# Patient Record
Sex: Male | Born: 1950 | Race: White | Hispanic: No | Marital: Married | Smoking: Never smoker
Health system: Southern US, Community
[De-identification: ages and names within clinical notes are randomized; demographics above are authoritative.]

## PROBLEM LIST (undated history)

## (undated) DIAGNOSIS — S83209A Unspecified tear of unspecified meniscus, current injury, unspecified knee, initial encounter: Secondary | ICD-10-CM

## (undated) DIAGNOSIS — N400 Enlarged prostate without lower urinary tract symptoms: Secondary | ICD-10-CM

## (undated) DIAGNOSIS — N189 Chronic kidney disease, unspecified: Secondary | ICD-10-CM

## (undated) DIAGNOSIS — R011 Cardiac murmur, unspecified: Secondary | ICD-10-CM

## (undated) DIAGNOSIS — K219 Gastro-esophageal reflux disease without esophagitis: Secondary | ICD-10-CM

## (undated) DIAGNOSIS — I10 Essential (primary) hypertension: Secondary | ICD-10-CM

## (undated) DIAGNOSIS — B019 Varicella without complication: Secondary | ICD-10-CM

## (undated) DIAGNOSIS — E119 Type 2 diabetes mellitus without complications: Secondary | ICD-10-CM

## (undated) DIAGNOSIS — D649 Anemia, unspecified: Secondary | ICD-10-CM

## (undated) HISTORY — DX: Chronic kidney disease, unspecified: N18.9

## (undated) HISTORY — DX: Essential (primary) hypertension: I10

## (undated) HISTORY — DX: Type 2 diabetes mellitus without complications: E11.9

## (undated) HISTORY — DX: Anemia, unspecified: D64.9

## (undated) HISTORY — PX: ESOPHAGOGASTRODUODENOSCOPY: SHX1529

## (undated) HISTORY — DX: Gastro-esophageal reflux disease without esophagitis: K21.9

## (undated) HISTORY — PX: COLONOSCOPY: SHX174

## (undated) HISTORY — DX: Varicella without complication: B01.9

## (undated) HISTORY — DX: Benign prostatic hyperplasia without lower urinary tract symptoms: N40.0

---

## 2010-07-10 ENCOUNTER — Ambulatory Visit: Payer: Self-pay | Admitting: Unknown Physician Specialty

## 2010-07-15 LAB — PATHOLOGY REPORT

## 2013-08-10 ENCOUNTER — Ambulatory Visit: Payer: Self-pay | Admitting: Unknown Physician Specialty

## 2014-03-19 DIAGNOSIS — I10 Essential (primary) hypertension: Secondary | ICD-10-CM | POA: Insufficient documentation

## 2014-03-19 DIAGNOSIS — D509 Iron deficiency anemia, unspecified: Secondary | ICD-10-CM | POA: Insufficient documentation

## 2014-03-19 DIAGNOSIS — N400 Enlarged prostate without lower urinary tract symptoms: Secondary | ICD-10-CM | POA: Insufficient documentation

## 2014-03-19 DIAGNOSIS — K219 Gastro-esophageal reflux disease without esophagitis: Secondary | ICD-10-CM | POA: Insufficient documentation

## 2014-03-19 DIAGNOSIS — E119 Type 2 diabetes mellitus without complications: Secondary | ICD-10-CM | POA: Insufficient documentation

## 2014-03-19 DIAGNOSIS — E1129 Type 2 diabetes mellitus with other diabetic kidney complication: Secondary | ICD-10-CM | POA: Insufficient documentation

## 2014-03-19 DIAGNOSIS — E785 Hyperlipidemia, unspecified: Secondary | ICD-10-CM | POA: Insufficient documentation

## 2014-03-26 ENCOUNTER — Inpatient Hospital Stay: Payer: Self-pay | Admitting: Internal Medicine

## 2014-03-26 LAB — TROPONIN I: Troponin-I: <0.02 ng/mL

## 2014-03-26 LAB — COMPREHENSIVE METABOLIC PANEL
ALBUMIN: 3.5 g/dL (ref 3.4–5.0)
Alkaline Phosphatase: 87 U/L
Anion Gap: 9 (ref 7–16)
BILIRUBIN TOTAL: 0.3 mg/dL (ref 0.2–1.0)
BUN: 41 mg/dL — AB (ref 7–18)
CALCIUM: 8.7 mg/dL (ref 8.5–10.1)
Chloride: 95 mmol/L — ABNORMAL LOW (ref 98–107)
Co2: 23 mmol/L (ref 21–32)
Creatinine: 3.36 mg/dL — ABNORMAL HIGH (ref 0.60–1.30)
EGFR (African American): 21 — ABNORMAL LOW
GFR CALC NON AF AMER: 18 — AB
Glucose: 190 mg/dL — ABNORMAL HIGH (ref 65–99)
Osmolality: 270 (ref 275–301)
POTASSIUM: 4.2 mmol/L (ref 3.5–5.1)
SGOT(AST): 106 U/L — ABNORMAL HIGH (ref 15–37)
SGPT (ALT): 104 U/L — ABNORMAL HIGH
Sodium: 127 mmol/L — ABNORMAL LOW (ref 136–145)
TOTAL PROTEIN: 7.4 g/dL (ref 6.4–8.2)

## 2014-03-26 LAB — URINALYSIS, COMPLETE
BLOOD: NEGATIVE
Bilirubin,UR: NEGATIVE
GLUCOSE, UR: NEGATIVE mg/dL (ref 0–75)
Hyaline Cast: 20
KETONE: NEGATIVE
LEUKOCYTE ESTERASE: NEGATIVE
Nitrite: NEGATIVE
PROTEIN: NEGATIVE
Ph: 5 (ref 4.5–8.0)
Specific Gravity: 1.012 (ref 1.003–1.030)
Squamous Epithelial: 2

## 2014-03-26 LAB — CK-MB
CK-MB: 1.9 ng/mL (ref 0.5–3.6)
CK-MB: 1.6 ng/mL (ref 0.5–3.6)
CK-MB: 1.2 ng/mL (ref 0.5–3.6)

## 2014-03-26 LAB — HEMOGLOBIN A1C: Hemoglobin A1C: 7 % — ABNORMAL HIGH (ref 4.2–6.3)

## 2014-03-26 LAB — CBC
HCT: 33.4 % — ABNORMAL LOW (ref 40.0–52.0)
HGB: 11.1 g/dL — ABNORMAL LOW (ref 13.0–18.0)
MCH: 31.8 pg (ref 26.0–34.0)
MCHC: 33.2 g/dL (ref 32.0–36.0)
MCV: 96 fL (ref 80–100)
Platelet: 169 10*3/uL (ref 150–440)
RBC: 3.49 10*6/uL — AB (ref 4.40–5.90)
RDW: 13.5 % (ref 11.5–14.5)
WBC: 3.1 10*3/uL — AB (ref 3.8–10.6)

## 2014-03-26 LAB — TSH: THYROID STIMULATING HORM: 1.11 u[IU]/mL

## 2014-03-27 LAB — CREATININE, SERUM
CREATININE: 2.18 mg/dL — AB (ref 0.60–1.30)
EGFR (African American): 36 — ABNORMAL LOW
EGFR (Non-African Amer.): 31 — ABNORMAL LOW

## 2014-03-27 LAB — SODIUM, URINE, RANDOM: Sodium, Urine Random: 57 mmol/L (ref 20–110)

## 2014-03-27 LAB — CREATININE, URINE, RANDOM: CREATININE, URINE RANDOM: 84 mg/dL (ref 30.0–125.0)

## 2014-03-28 LAB — CREATININE, SERUM
Creatinine: 1.92 mg/dL — ABNORMAL HIGH (ref 0.60–1.30)
EGFR (African American): 41 — ABNORMAL LOW
EGFR (Non-African Amer.): 36 — ABNORMAL LOW

## 2014-04-01 LAB — CULTURE, BLOOD (SINGLE)

## 2014-11-13 ENCOUNTER — Ambulatory Visit
Admit: 2014-11-13 | Disposition: A | Discharge status: Home / Self Care | Payer: Self-pay | Attending: Hematology and Oncology | Admitting: Hematology and Oncology

## 2014-11-29 ENCOUNTER — Ambulatory Visit
Admit: 2014-11-29 | Disposition: A | Discharge status: Home / Self Care | Payer: Self-pay | Attending: Hematology and Oncology | Admitting: Hematology and Oncology

## 2014-12-21 NOTE — H&P (Signed)
PATIENT NAME:  Joshua Cherry, Joshua Cherry MR#:  [id number] DATE OF BIRTH:  02-Jul-1951  DATE OF ADMISSION:  03/26/2014  ADMITTING PHYSICIAN: Gladstone Lighter, MD   PRIMARY CARE PHYSICIAN: Dr. Ellison Hughs at [facility].    CHIEF COMPLAINT: Dizziness and lightheadedness.   HISTORY OF PRESENT ILLNESS: Mr. Joshua Cherry is a very pleasant 64 year old Caucasian male with past medical history significant for hypertension, non-insulin-dependent diabetes mellitus, gastroesophageal reflux disease, benign prostatic hypertrophy, hyperlipidemia who was sent in from [facility] [place] secondary to worsening dizziness and lightheadedness.  The patient worked third shift at [employer] for the past 2 to 3 days. He feels like he has been extremely lightheaded. It is not positional. Does not worsen standing up. It is present all the time. He had diarrhea all night last night. He felt like he has been feeling weak for the last few days. He had blood work done at his PCPs office about two weeks ago. They that his kidney function was slightly off and he needs to be monitored and has a follow-up this week.  A couple of days ago he felt so weak that he almost passed out in the kitchen. Denies any seizure-like episodes.  He went to work last night and was feeling extremely bad and this morning his work people sent him to the ER for monitoring. Labs indicate acute renal failure and hypernatremia and he is being admitted for the same. The patient takes lisinopril, HCTZ and metformin at home and he has being continued continuing to take lately.   PAST MEDICAL HISTORY:  1. Hypertension.  2. Non-insulin-dependent diabetes mellitus.  3. Gastroesophageal reflux disease.  4. Benign prostatic hypertrophy.  5. Hyperlipidemia.   PAST SURGICAL HISTORY: None.   ALLERGIES: NO KNOWN DRUG ALLERGIES.   CURRENT HOME MEDICATIONS: Include:  1. Aspirin 81 mg p.o. daily.  2. Coreg 3.125 mg p.o. b.i.d.  3. Lisinopril HCTZ 20/12.5 mg 1 tablet  p.o. daily.  4. Ferrous sulfate 325 mg p.o. daily.  5. Finasteride 5 mg p.o. at bedtime.  6. Glipizide 10 mg p.o. at bedtime.  7. Glipizide 5 mg in the morning.  8. Lovastatin 40 mg p.o. at bedtime.  9. Metformin 1000 mg p.o. b.i.d.  10. Prilosec 20 mg p.o. b.i.d.  11. Actos 45 mg p.o. daily.  12. Super B complex oral tablet 1 tablet p.o. daily.  13. Flomax 0.4 mg p.o. daily.  14. Vitamin B12 1000 mcg injectable solution 1 mL  once a month.  15. Vitamin D3, 2000 international units p.o. daily.   SOCIAL HISTORY: Lives at home with his wife. Works at the [employer] in [place]. No smoking or alcohol use.   FAMILY HISTORY: Both parents had heart disease and stroke.   REVIEW OF SYSTEMS:  CONSTITUTIONAL: No fevers. Positive for fatigue and weakness.  EYES: No blurred vision, double vision. No other inflammation or glaucoma. Uses glasses bifocals.  ENT: No tinnitus, ear pain, hearing loss, epistaxis or discharge.  RESPIRATORY: No cough, wheeze, hemoptysis or chronic obstructive pulmonary disease.  CARDIOVASCULAR: No chest pain, orthopnea, edema, arrhythmia, palpitations, or syncope.  GASTROINTESTINAL: No nausea, vomiting, diarrhea, abdominal pain, hematemesis, or melena.  GENITOURINARY: Positive for hesitancy. No dysuria, hematuria, renal calculus.  ENDOCRINE: No polyuria, nocturia, thyroid problems, heat or cold intolerance.  HEMATOLOGY: No anemia, easy bruising or bleeding.  SKIN: No acne, rash or lesions.  MUSCULOSKELETAL: No neck, back, shoulder pain, arthritis or gout.  NEUROLOGIC: No numbness, weakness, CVA, transient ischemic attack or seizures.  PSYCHOLOGICAL:  No anxiety, insomnia, depression.   PHYSICAL EXAMINATION: VITAL SIGNS: Temperature 98.1 degree Fahrenheit, pulse 96, respirations 18, blood pressure 145/57 ,  pulse ox 100% on room air.  GENERAL: Well-built, well-nourished male lying in bed, not in any acute distress.  HEENT: Normocephalic, atraumatic. Pupils equal,  round, reacting to light. Anicteric sclerae. Extraocular movements intact.  OROPHARYNX: Clear without erythema, mass or exudates.  NECK: Supple. No thyromegaly, JVD or carotid bruits. No lymphadenopathy.  LUNGS: Moving air bilaterally. No wheeze or crackles. No use of accessory muscles for breathing.  CARDIOVASCULAR: S1, S2, regular rate and rhythm. No murmurs, rubs, or gallops.  ABDOMEN: Soft, nontender, nondistended. No hepatosplenomegaly. Normal bowel sounds.  EXTREMITIES: No pedal edema. No clubbing or cyanosis, 2+ dorsalis pedis pulses palpable bilaterally.  SKIN: No acne, rash or lesions.  LYMPHATICS: No cervical or inguinal lymphadenopathy.  NEUROLOGIC: Cranial nerves intact. No focal motor or sensory deficits.  PSYCHOLOGICAL: The patient is awake, alert, oriented x 3.   LABORATORY DATA: WBC 3.1, hemoglobin is 11.1, hematocrit 33.4, platelet count 169.   Sodium 127, potassium 4.2, chloride 97, bicarbonate 23, BUN 41, creatinine 3.36 and glucose of 190, calcium of 8.7.   ALT 104, AST 106, alkaline phosphatase 87, total bilirubin 0.3 and albumin of 3.5. TSH 1.1. Troponin less than 0.02. Urinalysis negative for any infection. EKG showing normal sinus rhythm. No acute ST-T wave abnormalities, heart rate of 71. CT of the head showing normal CT of the brain.   ASSESSMENT AND PLAN:  A 64 year old man with diabetes, hypertension, hyperlipidemia, gastroesophageal reflux disease and benign prostatic hypertrophy comes in with dizziness and noted to have acute renal failure and hypernatremia.  1. Acute renal failure. Not sure what the baseline creatinine is. Labs have been requested from PCPs office and they are pending at this time. Check orthostatic vitals. Sodium is also low, so he could be prerenal.  Continue IV fluids. Get a renal ultrasound. Recheck in a.m. If not improving,  get nephrology consult. Hold nephrotoxins until then.  2. Hypertension. Hold lisinopril HCTZ. Coreg can be continued at  this time.  3. Diabetes mellitus. On glipizide and Actos. Hold metformin for now and sliding scale insulin. Check hba1c . 4. Benign prostatic hypertrophy. Continue home medications.  5. Gastroesophageal reflux disease, currently on Protonix b.i.d.  6. Deep vein thrombosis prophylaxis. Subcutaneous heparin at this time.  7. CODE STATUS: Full code.   TIME SPENT ON ADMISSION: 50 minutes.    __ __________________________ Gladstone Lighter, MD rk:sg D: 03/26/2014 11:04:48 ET T: 03/26/2014 11:19:16 ET JOB#: [id number]  cc: Gladstone Lighter, MD, <Dictator> [name], MD  Gladstone Lighter MD ELECTRONICALLY SIGNED 03/27/2014 15:08

## 2014-12-21 NOTE — Discharge Summary (Signed)
PATIENT NAME:  Joshua Cherry, Joshua Cherry MR#:  [id number] DATE OF BIRTH:  December 05, 1950  DATE OF ADMISSION:  03/26/2014 DATE OF DISCHARGE:  03/28/2014  DISCHARGE DIAGNOSES: 1.  Acute renal failure, likely prerenal, now improving.  2.  Fever of unknown lesion, now resolved, started on empiric doxycycline for 5 more days.   SECONDARY DIAGNOSES: 1.  Hypertension.  2.  Diabetes.  3.  Gastroesophageal reflux disease. 4.  BPH.  5.  Hyperlipidemia.   CONSULTATIONS: None.   PROCEDURES AND RADIOLOGY: CT scan of the head without contrast on July 28, showed no acute pathology.   Chest x-ray on July 29, showed no acute pathology, thoracic spondylosis.   Bilateral kidney ultrasound on July 28, showed no obstructive uropathy. Right renal cyst.   MAJOR LABORATORY PANEL: Urinalysis on admission was negative. Blood cultures x2 were negative.   HISTORY AND SHORT HOSPITAL COURSE: The patient is a 64 year old male with above-mentioned medical problems was admitted for dizziness and lightheadedness, thought to be due to severe dehydration and acute renal failure. Please see Dr. Leta Baptist dictated history and physical for further details. The patient was started on aggressive IV hydration. His kidney function started improving. This was thought to be prerenal in nature. His creatinine came down to 1.9. His creatinine on admission was 3.36. He was ruled out with 3 negative sets of troponins. He was feeling much better, did not have any further dizziness, was walking around in the hallways without any difficulty and was discharged home in stable condition on July 30.  PHYSICAL EXAMINATION: VITAL SIGNS:  On the date of discharge, are as follows: Temperature 97.5, heart rate 55 per minute, respirations 18 per minute, blood pressure 122/61 mmHg.  He was saturating 98% on room air.  CARDIOVASCULAR: S1, S2 normal. No murmurs, rubs, or gallop.  LUNGS: Clear to auscultation bilaterally. No wheezing, rales, rhonchi, or  crepitation.  ABDOMEN: Soft, benign.  NEUROLOGIC: Nonfocal examination.   All other physical examination remained at baseline.   DISCHARGE MEDICATIONS: 1.  Iron sulfate 325 mg p.o. daily.  2.  Vitamin D3 2000 international units once daily.  3.  Super B complex vitamin once daily.  4.  Tamsulosin 0.4 mg p.o. daily.  5.  Finasteride 5 mg p.o. at bedtime.  6.  Aspirin 81 mg p.o. daily. 7.  Vitamin B12 injection once a month.  8.  Glipizide 10 mg p.o. at bedtime.  9.  Pioglitazone 45 mg p.o. daily.  9.  Omeprazole 20 mg p.o. b.i.d.  10.  Coreg 3.125 mg p.o. b.i.d.  11.  Lovastatin 20 mg 2 tablets p.o. at bedtime. 12.  Glipizide 10 mg 1/2 tablet p.o. daily.  13.  Hydrochlorothiazide/lisinopril 12.5/20 mg 1 tablet p.o. daily. This was instructed to be held until 03/31/2014, can be resumed on 04/01/2014. 14.  Metformin 1000 mg p.o. b.i.d.  Again to be held until 03/31/2014 and can be resumed on 04/01/2014. 15.  Doxycycline 200 mg p.o. daily for 5 more days.   DISCHARGE DIET: Renal diet.   DISCHARGE ACTIVITY: As tolerated.   DISCHARGE INSTRUCTIONS AND FOLLOWUP: The patient was instructed to follow up with his primary care physician, Dr. Ellison Hughs, in 1-2 weeks. He was instructed to get CBC and basic metabolic panel checked in 1 week with results forwarded to his primary care physician for monitoring of his kidney function.   TOTAL TIME DISCHARGING THIS PATIENT:  45 minutes.   ____________________________ Lucina Mellow. Manuella Ghazi, MD vss:ds D: 03/28/2014 19:33:05 ET T: 03/28/2014 20:21:53 ET  JOB#: [id number]  cc: Eliz Nigg S. Manuella Ghazi, MD, <Dictator> [name], MD Lucina Mellow [facility] MD ELECTRONICALLY SIGNED 04/02/2014 9:29

## 2015-02-21 ENCOUNTER — Encounter: Payer: Self-pay | Admitting: Hematology and Oncology

## 2015-02-21 ENCOUNTER — Inpatient Hospital Stay: Payer: BLUE CROSS/BLUE SHIELD | Attending: Hematology and Oncology | Admitting: Hematology and Oncology

## 2015-02-21 VITALS — BP 155/66 | HR 62 | Temp 97.9°F | Resp 17 | Ht 71.0 in | Wt 182.3 lb

## 2015-02-21 DIAGNOSIS — Z79899 Other long term (current) drug therapy: Secondary | ICD-10-CM | POA: Diagnosis not present

## 2015-02-21 DIAGNOSIS — N189 Chronic kidney disease, unspecified: Secondary | ICD-10-CM | POA: Insufficient documentation

## 2015-02-21 DIAGNOSIS — E119 Type 2 diabetes mellitus without complications: Secondary | ICD-10-CM

## 2015-02-21 DIAGNOSIS — Z7982 Long term (current) use of aspirin: Secondary | ICD-10-CM | POA: Insufficient documentation

## 2015-02-21 DIAGNOSIS — E538 Deficiency of other specified B group vitamins: Secondary | ICD-10-CM | POA: Diagnosis not present

## 2015-02-21 DIAGNOSIS — E139 Other specified diabetes mellitus without complications: Secondary | ICD-10-CM | POA: Insufficient documentation

## 2015-02-21 DIAGNOSIS — K219 Gastro-esophageal reflux disease without esophagitis: Secondary | ICD-10-CM

## 2015-02-21 DIAGNOSIS — D631 Anemia in chronic kidney disease: Secondary | ICD-10-CM | POA: Insufficient documentation

## 2015-02-21 DIAGNOSIS — I129 Hypertensive chronic kidney disease with stage 1 through stage 4 chronic kidney disease, or unspecified chronic kidney disease: Secondary | ICD-10-CM | POA: Diagnosis not present

## 2015-02-21 NOTE — Progress Notes (Signed)
[facility] day:  02/21/2015  Chief Complaint: Joshua Cherry is a 64 y.o. male with anemia who is seen for 3 month assessment.  HPI: The patient was last seen in the medical oncology clinic on 12/06/2014.  At that time, initial workup for his anemia was reviewed. Workup suggested anemia of chronic renal disease. He was referred to Dr. Jefm Bryant secondary to a positive ANA of unclear significance. He states that he was told that he "didn't think this was an autoimmune disease". He underwent echocardiogram. He has a follow-up in 6 months.  She was seen by nephrology. His kidney function is stable. He has a follow-up in 6 months.  His creatinine has ranged between 1.92 and 2.18 with an estimated creatinine clearance of 31-36 ml/min.   Labs from 02/11/2015 at [place] revealed a hematocrit 31.5, hemoglobin 10.4, MCV 95, platelets 123,000, and white count 5800 with an ANC of 3000. Iron studies included a saturation of 22%.  TIBC and serum iron were normal.  Ferritin was 75.  PSA was 2.  Symptomatically, he notes no complaints. His energy level is good.  Past Medical History  Diagnosis Date  . Diabetes mellitus without complication   . Hypertension   . Anemia   . GERD (gastroesophageal reflux disease)   . Chronic kidney disease     No past surgical history on file.  Family History  Problem Relation Age of Onset  . Diabetes Mother   . Hypertension Mother   . Diabetes Father     Social History:  reports that he has never smoked. He has never used smokeless tobacco. He reports that he does not drink alcohol or use illicit drugs.  The patient is alone today.  Allergies: Not on File  Current Medications: Current Outpatient Prescriptions  Medication Sig Dispense Refill  . carvedilol (COREG) 3.125 MG tablet Take 3.125 mg by mouth.    . cyanocobalamin (,VITAMIN B-12,) 1000 MCG/ML injection     . finasteride (PROSCAR) 5 MG tablet Take 5 mg by  mouth.    Marland Kitchen glipiZIDE (GLUCOTROL) 10 MG tablet     . lisinopril-hydrochlorothiazide (PRINZIDE,ZESTORETIC) 20-12.5 MG per tablet     . lovastatin (MEVACOR) 20 MG tablet     . omeprazole (PRILOSEC) 20 MG capsule     . ONETOUCH DELICA LANCETS 16X MISC     . pioglitazone (ACTOS) 45 MG tablet     . sitaGLIPtin (JANUVIA) 25 MG tablet     . tamsulosin (FLOMAX) 0.4 MG CAPS capsule     . aspirin EC 81 MG tablet Take 81 mg by mouth.    . B-Complex-C CAPS Take by mouth.    . Cholecalciferol (VITAMIN D3) 2000 UNITS capsule Take by mouth.    . ferrous sulfate 325 (65 FE) MG tablet Take 325 mg by mouth.     No current facility-administered medications for this visit.    Review of Systems:  GENERAL:  Feels fairly good.  Active.  No fevers, sweats or weight loss. PERFORMANCE STATUS (ECOG): 0 HEENT:  No visual changes, runny nose, sore throat, mouth sores or tenderness. Lungs: No shortness of breath or cough.  No hemoptysis. Cardiac:  No chest pain, palpitations, orthopnea, or PND. GI:  No nausea, vomiting, diarrhea, constipation, melena or hematochezia. GU:  No urgency, frequency, dysuria, or hematuria. Musculoskeletal:  No back pain.  No joint pain.  No muscle tenderness. Extremities:  No pain or swelling. Skin:  No rashes or skin  changes. Neuro:  No headache, numbness or weakness, balance or coordination issues. Endocrine:  No diabetes, thyroid issues, hot flashes or night sweats. Psych:  No mood changes, depression or anxiety. Pain:  No focal pain. Review of systems:  All other systems reviewed and found to be negative.   Physical Exam: Blood pressure 155/66, pulse 62, temperature 97.9 F (36.6 C), temperature source Oral, resp. rate 17, height $RemoveBe'5\' 11"'coZEchkLt$  (1.803 m), weight 182 lb 5.1 oz (82.7 kg). GENERAL:  Well developed, well nourished, sitting comfortably in the exam room in no acute distress. MENTAL STATUS:  Alert and oriented to person, place and time. HEAD:  Short gray hair.   Normocephalic, atraumatic, face symmetric, no Cushingoid features. EYES:  Round glasses.  Blue eyes.  Pupils equal round and reactive to light and accomodation.  No conjunctivitis or scleral icterus. ENT:  Oropharynx clear without lesion.  Tongue normal. Mucous membranes moist.  RESPIRATORY:  Clear to auscultation without rales, wheezes or rhonchi. CARDIOVASCULAR:  Regular rate and rhythm without murmur, rub or gallop. ABDOMEN:  Soft, non-tender, with active bowel sounds, and no hepatosplenomegaly.  No masses. SKIN:  No rashes, ulcers or lesions. EXTREMITIES:  Trace ankle edema.  No skin discoloration or tenderness.  No palpable cords. LYMPH NODES: No palpable cervical, supraclavicular, axillary or inguinal adenopathy  NEUROLOGICAL: Unremarkable. PSYCH:  Appropriate.   No visits with results within 3 Day(s) from this visit. Latest known visit with results is:  [facility] Conversion on 03/26/2014  Component Date Value Ref Range Status  . Glucose 03/26/2014 190* 65-99 mg/dL Final  . BUN 03/26/2014 41* 7-18 mg/dL Final  . Creatinine 03/26/2014 3.36* 0.60-1.30 mg/dL Final  . Sodium 03/26/2014 127* 136-145 mmol/L Final  . Potassium 03/26/2014 4.2  3.5-5.1 mmol/L Final  . Chloride 03/26/2014 95* 98-107 mmol/L Final  . Co2 03/26/2014 23  21-32 mmol/L Final  . Calcium, Total 03/26/2014 8.7  8.5-10.1 mg/dL Final  . SGOT(AST) 03/26/2014 106* 15-37 Unit/L Final  . SGPT (ALT) 03/26/2014 104*  Final   Comment: 14-63 NOTE: New Reference Range 03/19/14   . Alkaline Phosphatase 03/26/2014 87   Final   Comment: 46-116 NOTE: New Reference Range 03/19/14   . Albumin 03/26/2014 3.5  3.4-5.0 g/dL Final  . Total Protein 03/26/2014 7.4  6.4-8.2 g/dL Final  . Bilirubin,Total 03/26/2014 0.3  0.2-1.0 mg/dL Final  . Osmolality 03/26/2014 270  275-301 Final  . Anion Gap 03/26/2014 9  7-16 Final  . EGFR (African American) 03/26/2014 21*  Final  . EGFR (Non-African Amer.) 03/26/2014 18*  Final   Comment: eGFR  values <45mL/min/1.73 m2 may be an indication of chronic kidney disease (CKD). Calculated eGFR is useful in patients with stable renal function. The eGFR calculation will not be reliable in acutely ill patients when serum creatinine is changing rapidly. It is not useful in  patients on dialysis. The eGFR calculation may not be applicable to patients at the low and high extremes of body sizes, pregnant women, and vegetarians.   . WBC 03/26/2014 3.1* 3.8-10.6 x10 3/mm 3 Final  . RBC 03/26/2014 3.49* 4.40-5.90 x10 6/mm 3 Final  . HGB 03/26/2014 11.1* 13.0-18.0 g/dL Final  . HCT 03/26/2014 33.4* 40.0-52.0 % Final  . MCV 03/26/2014 96  80-100 fL Final  . MCH 03/26/2014 31.8  26.0-34.0 pg Final  . MCHC 03/26/2014 33.2  32.0-36.0 g/dL Final  . RDW 03/26/2014 13.5  11.5-14.5 % Final  . Platelet 03/26/2014 169  150-440 x10 3/mm 3 Final  . Troponin-I 03/26/2014 <  0.02   Final   Comment: 0.00-0.05 0.05 ng/mL or less: NEGATIVE  Repeat testing in 3-6 hrs  if clinically indicated. >0.05 ng/mL: POTENTIAL  MYOCARDIAL INJURY. Repeat  testing in 3-6 hrs if  clinically indicated. NOTE: An increase or decrease  of 30% or more on serial  testing suggests a  clinically important change   . Thyroid Stimulating Horm 03/26/2014 1.11   Final   Comment: 0.45-4.50 (International Unit)  ----------------------- Pregnant patients have  different reference  ranges for TSH:  - - - - - - - - - -  Pregnant, first trimetser:  0.36 - 2.50 uIU/mL   . Color - urine 03/26/2014 Amber   Final  . Clarity - urine 03/26/2014 Cloudy   Final  . [place] Meth 03/26/2014 Negative  0-75 mg/dL Final  . Bilirubin,UR 03/26/2014 Negative  NEGATIVE Final  . Ketone 03/26/2014 Negative  NEGATIVE Final  . Specific Gravity 03/26/2014 1.012  1.003-1.030 Final  . Blood 03/26/2014 Negative  NEGATIVE Final  . Ph 03/26/2014 5.0  4.5-8.0 Final  . Protein 03/26/2014 Negative  NEGATIVE Final  . Nitrite 03/26/2014 Negative  NEGATIVE  Final  . Leukocyte Esterase 03/26/2014 Negative  NEGATIVE Final  . RBC,UR 03/26/2014 1 /HPF  0-5 /HPF Final  . WBC UR 03/26/2014 4 /HPF  0-5 /HPF Final  . Bacteria 03/26/2014 TRACE  NONE SEEN Final  . Squamous Epithelial 03/26/2014 2 /HPF   Final  . Mucous 03/26/2014 PRESENT   Final  . Hyaline Cast 03/26/2014 20 /LPF   Final  . Hemoglobin A1C 03/26/2014 7.0* 4.2-6.3 % Final   Comment: The American Diabetes Association recommends that a primary goal of therapy should be <7% and that physicians should reevaluate the treatment regimen in patients with HbA1c values consistently >8%.   Marland Kitchen CK-MB 03/26/2014 1.9  0.5-3.6 ng/mL Final  . Troponin-I 03/26/2014 < 0.02   Final   Comment: 0.00-0.05 0.05 ng/mL or less: NEGATIVE  Repeat testing in 3-6 hrs  if clinically indicated. >0.05 ng/mL: POTENTIAL  MYOCARDIAL INJURY. Repeat  testing in 3-6 hrs if  clinically indicated. NOTE: An increase or decrease  of 30% or more on serial  testing suggests a  clinically important change   . CK-MB 03/26/2014 1.6  0.5-3.6 ng/mL Final  . Troponin-I 03/26/2014 < 0.02   Final   Comment: 0.00-0.05 0.05 ng/mL or less: NEGATIVE  Repeat testing in 3-6 hrs  if clinically indicated. >0.05 ng/mL: POTENTIAL  MYOCARDIAL INJURY. Repeat  testing in 3-6 hrs if  clinically indicated. NOTE: An increase or decrease  of 30% or more on serial  testing suggests a  clinically important change   . CK-MB 03/26/2014 1.2  0.5-3.6 ng/mL Final  . Micro Text Report 03/27/2014    Final                   Value:   COMMENT                   NO GROWTH AEROBICALLY/ANAEROBICALLY IN 5 DAYS   ANTIBIOTIC                                                      . Micro Text Report 03/27/2014    Final  Value:   SOURCE: 2nd left hand    COMMENT                   NO GROWTH AEROBICALLY/ANAEROBICALLY IN 5 DAYS   ANTIBIOTIC                                                      . Creatinine 03/27/2014 2.18* 0.60-1.30  mg/dL Final  . EGFR (African American) 03/27/2014 36*  Final  . EGFR (Non-African Amer.) 03/27/2014 31*  Final   Comment: eGFR values <58mL/min/1.73 m2 may be an indication of chronic kidney disease (CKD). Calculated eGFR is useful in patients with stable renal function. The eGFR calculation will not be reliable in acutely ill patients when serum creatinine is changing rapidly. It is not useful in  patients on dialysis. The eGFR calculation may not be applicable to patients at the low and high extremes of body sizes, pregnant women, and vegetarians.   . Sodium, Urine Random 03/27/2014 57  20-110 mmol/L Final  . Creatinine, Urine Random 03/27/2014 84.0  30.0-125.0 mg/dL Final  . Creatinine 03/28/2014 1.92* 0.60-1.30 mg/dL Final  . EGFR (African American) 03/28/2014 41*  Final  . EGFR (Non-African Amer.) 03/28/2014 36*  Final   Comment: eGFR values <53mL/min/1.73 m2 may be an indication of chronic kidney disease (CKD). Calculated eGFR is useful in patients with stable renal function. The eGFR calculation will not be reliable in acutely ill patients when serum creatinine is changing rapidly. It is not useful in  patients on dialysis. The eGFR calculation may not be applicable to patients at the low and high extremes of body sizes, pregnant women, and vegetarians.     Assessment:  Joshua Cherry is a 64 y.o. male with anemia of chronic renal disease.  He has a history of B12 deficiency in the past year and is on monthy injections.  He previously was a regular blood donor until approximately 2-3 years ago when he was told his "iron was low".  Diet is good.  EGD and colonoscopy noted only reflux in 2015.  Guaiac cards have been negative.  He has never had a capsule study.  He has chronic kidney disease (creatinine clearance 31-36 ml/min).  Labs on 11/04/2014 revealed a hematocrit of 31.8, hemoglobin 10.3, MCV 95, with a normal WBC and platelet count.  B12 and folate were normal.  SPEP was  normal on 07/09/2014.  Labs from 11/18/2014 revealed normal iron studies with a saturation of 18% and a TIBC of 297. Erythropoietin was 10.6. Antinuclear antibody direct was positive. Sedimentation rate was 3. Haptoglobin was 181. Coombs was negative. Ferritin was 72. ANA comprehensive panel noted anti-DNA double-stranded 24 (0-9).    Symptomatically, he feels well.  Exam is unremarkable.  Plan: 1. Review labs. 2. Discuss plan to initiate erythropoietin when hematocrit < 30 and hemoglobin < 10. 3. Slip for [facility]. 4. RTC in 6 months for MD assessment and review of labs.   Lequita Asal, MD  02/21/2015, 9:19 AM

## 2015-02-22 ENCOUNTER — Encounter: Payer: Self-pay | Admitting: Hematology and Oncology

## 2015-02-22 DIAGNOSIS — D649 Anemia, unspecified: Secondary | ICD-10-CM | POA: Insufficient documentation

## 2015-07-05 ENCOUNTER — Ambulatory Visit (INDEPENDENT_AMBULATORY_CARE_PROVIDER_SITE_OTHER): Payer: BLUE CROSS/BLUE SHIELD

## 2015-07-05 ENCOUNTER — Ambulatory Visit
Admission: EM | Admit: 2015-07-05 | Discharge: 2015-07-05 | Disposition: A | Discharge status: Home / Self Care | Payer: BLUE CROSS/BLUE SHIELD | Attending: Family Medicine | Admitting: Family Medicine

## 2015-07-05 ENCOUNTER — Encounter: Payer: Self-pay | Admitting: Emergency Medicine

## 2015-07-05 DIAGNOSIS — S8001XA Contusion of right knee, initial encounter: Secondary | ICD-10-CM

## 2015-07-05 DIAGNOSIS — S20212A Contusion of left front wall of thorax, initial encounter: Secondary | ICD-10-CM

## 2015-07-05 DIAGNOSIS — W19XXXA Unspecified fall, initial encounter: Secondary | ICD-10-CM

## 2015-07-05 DIAGNOSIS — S9031XA Contusion of right foot, initial encounter: Secondary | ICD-10-CM | POA: Diagnosis not present

## 2015-07-05 DIAGNOSIS — S20219A Contusion of unspecified front wall of thorax, initial encounter: Secondary | ICD-10-CM | POA: Diagnosis not present

## 2015-07-05 MED ORDER — HYDROCODONE-ACETAMINOPHEN 5-325 MG PO TABS: 1.0000 | ORAL_TABLET | Freq: Four times a day (QID) | ORAL | Status: DC | PRN

## 2015-07-05 NOTE — ED Notes (Signed)
Patient states that he fell about 4 ft. Off his ladder yesterday.  Patient denies hitting his head. Patient denies LOC.  Patient c/o pain in his right knee, right foot, and left rib cage.

## 2015-07-05 NOTE — ED Provider Notes (Signed)
CSN: [id number]     Arrival date & time 07/05/15  [id number] History   First MD Initiated Contact with Patient 07/05/15 1024     Chief Complaint  Patient presents with  . Fall  . Knee Pain  . Foot Pain   (Consider location/radiation/quality/duration/timing/severity/associated sxs/prior Treatment) HPI Comments: 64 yo male presents with a complaint of right knee pain, right foot pain and left lower rib pain after falling about 4 feet off a ladder yesterday morning.  States he first landed on his knee but somehow (not quite sure) hit his right foot and left lower ribs. Today woke up with worse pain and mild swelling to these areas. Denies hitting his head, loss of consciousness, vision changes, neck pain, dizziness.  The history is provided by the patient.    Past Medical History  Diagnosis Date  . Diabetes mellitus without complication ([place])   . Hypertension   . Anemia   . GERD (gastroesophageal reflux disease)   . Chronic kidney disease    History reviewed. No pertinent past surgical history. Family History  Problem Relation Age of Onset  . Diabetes Mother   . Hypertension Mother   . Diabetes Father    Social History  Substance Use Topics  . Smoking status: Never Smoker   . Smokeless tobacco: Never Used  . Alcohol Use: No    Review of Systems  Allergies  Review of patient's allergies indicates no known allergies.  Home Medications   Prior to Admission medications   Medication Sig Start Date End Date Taking? Authorizing Provider  glipiZIDE (GLUCOTROL) 5 MG tablet Take 5 mg by mouth daily before breakfast.   Yes Historical Provider, MD  aspirin EC 81 MG tablet Take 81 mg by mouth daily.     Historical Provider, MD  B-Complex-C CAPS Take 1 capsule by mouth daily.     Historical Provider, MD  carvedilol (COREG) 3.125 MG tablet Take 3.125 mg by mouth. 06/04/14   Historical Provider, MD  Cholecalciferol (VITAMIN D3) 2000 UNITS capsule Take 2,000 Units by mouth daily.      Historical Provider, MD  cyanocobalamin (,VITAMIN B-12,) 1000 MCG/ML injection Inject 1,000 mcg into the muscle every 30 (thirty) days.  05/21/14   Historical Provider, MD  ferrous sulfate 325 (65 FE) MG tablet Take 325 mg by mouth daily with breakfast.     Historical Provider, MD  finasteride (PROSCAR) 5 MG tablet Take 5 mg by mouth daily.  05/10/14   Historical Provider, MD  glipiZIDE (GLUCOTROL) 10 MG tablet Take 10 mg by mouth every evening.  04/01/14   Historical Provider, MD  HYDROcodone-acetaminophen (NORCO/VICODIN) 5-325 MG tablet Take 1-2 tablets by mouth every 6 (six) hours as needed. 07/05/15   Norval Gable, MD  lisinopril-hydrochlorothiazide (PRINZIDE,ZESTORETIC) 20-12.5 MG per tablet Take 2 tablets by mouth daily.  05/10/14   Historical Provider, MD  lovastatin (MEVACOR) 20 MG tablet Take 40 mg by mouth daily at 6 PM.  05/29/14   Historical Provider, MD  omeprazole (PRILOSEC) 20 MG capsule Take 20 mg by mouth 2 (two) times daily before a meal.  05/10/14   Historical Provider, MD  Jonetta Speak LANCETS [id number] [place]  06/01/14   Historical Provider, MD  pioglitazone (ACTOS) 45 MG tablet Take 45 mg by mouth daily.  06/04/14   Historical Provider, MD  sitaGLIPtin (JANUVIA) 25 MG tablet Take 25 mg by mouth daily.  06/20/14   Historical Provider, MD  tamsulosin (FLOMAX) 0.4 MG CAPS capsule Take 0.4 mg by mouth  daily after breakfast.  05/02/14   Historical Provider, MD   Meds Ordered and Administered this Visit  Medications - No data to display  BP 150/61 mmHg  Pulse 71  Temp(Src) 97.9 F (36.6 C) (Tympanic)  Resp 16  Ht 6' (1.829 m)  Wt 180 lb (81.647 kg)  BMI 24.41 kg/m2  SpO2 100% No data found.   Physical Exam  Constitutional: He appears well-developed and well-nourished. No distress.  HENT:  Head: Normocephalic and atraumatic.  Eyes: EOM are normal. Pupils are equal, round, and reactive to light.  Neck: Normal range of motion. Neck supple. No tracheal deviation present.   Cardiovascular: Normal rate, regular rhythm and normal heart sounds.   Pulmonary/Chest: Effort normal and breath sounds normal. No respiratory distress. He has no wheezes. He has no rales. He exhibits tenderness (over the left lower ribs).  Musculoskeletal: He exhibits edema.       Right knee: He exhibits swelling (mild, anterior) and bony tenderness (diffuse, anterior). He exhibits no ecchymosis, no deformity, no laceration and no erythema. Tenderness found.       Right foot: There is bony tenderness and swelling. There is normal range of motion, no crepitus, no deformity and no laceration.  Skin: He is not diaphoretic.  Nursing note and vitals reviewed.   ED Course  Procedures (including critical care time)  Labs Review Labs Reviewed - No data to display  Imaging Review Dg Ribs Unilateral W/chest Left  07/05/2015  CLINICAL DATA:  64 year old male with history of trauma from a fall yesterday with injury to the left side of the chest, complaining of left lower rib pain. EXAM: LEFT RIBS AND CHEST - 3+ VIEW COMPARISON:  Chest x-ray a 03/27/2014. FINDINGS: Lung volumes are normal. No consolidative airspace disease. No pleural effusions. No pneumothorax. No pulmonary nodule or mass noted. Pulmonary vasculature and the cardiomediastinal silhouette are within normal limits. Atherosclerosis in the thoracic aorta. Dedicated views of the left ribs demonstrate no acute displaced left-sided rib fractures. IMPRESSION: 1. No acute displaced left-sided rib fractures. 2. No pneumothorax or other findings to suggest significant acute traumatic injury to the thorax on today's plain film examination. 3. Atherosclerosis. Electronically Signed   By: Vinnie Langton M.D.   On: 07/05/2015 11:20   Dg Knee Complete 4 Views Right  07/05/2015  CLINICAL DATA:  64 year old male with history of trauma from a fall yesterday complaining of pain in the medial aspect of the right knee. EXAM: RIGHT KNEE - COMPLETE 4+ VIEW  COMPARISON:  No priors. FINDINGS: Multiple views of the right knee demonstrate no acute displaced fracture, subluxation, dislocation, or soft tissue abnormality. IMPRESSION: No acute radiographic abnormality of the right knee. Electronically Signed   By: Vinnie Langton M.D.   On: 07/05/2015 11:17   Dg Foot Complete Right  07/05/2015  CLINICAL DATA:  Posterior mid right foot pain after a fall yesterday. EXAM: RIGHT FOOT COMPLETE - 3+ VIEW COMPARISON:  None. FINDINGS: Arterial calcifications. Moderately large posterior and inferior calcaneal spurs. No fracture or dislocation. IMPRESSION: 1. No fracture. 2. Calcaneal spurs. 3. Atheromatous arterial calcifications. Electronically Signed   By: Claudie Revering M.D.   On: 07/05/2015 11:18     Visual Acuity Review  Right Eye Distance:   Left Eye Distance:   Bilateral Distance:    Right Eye Near:   Left Eye Near:    Bilateral Near:         MDM   1. Fall, initial encounter   2. Contusion,  chest wall, left, initial encounter   3. Contusion, knee, right, initial encounter   4. Contusion, foot, right, initial encounter    Discharge Medication List as of 07/05/2015 11:36 AM    START taking these medications   Details  HYDROcodone-acetaminophen (NORCO/VICODIN) 5-325 MG tablet Take 1-2 tablets by mouth every 6 (six) hours as needed., Starting 07/05/2015, Until Discontinued, Print      1. x-ray results and diagnosis reviewed with patient 2. rx as per orders above; reviewed possible side effects, interactions, risks and benefits  3. Recommend supportive treatment with rest, ice, elevation 4. Follow prn if symptoms worsen or don't improve    Norval Gable, MD 07/05/15 1147

## 2015-07-25 IMAGING — CT CT HEAD WITHOUT CONTRAST
1 series · 16 of 29 positions shown, 20 images · non-contrast
Comparison: None.

CLINICAL DATA: Unexplained dizziness for several days

EXAM:
CT HEAD WITHOUT CONTRAST
TECHNIQUE: Contiguous axial images were obtained from the base of the skull
through the vertex without intravenous contrast.

[Series 2: soft tissue · axial · 0.42mm/px · z∈[-32,+98]mm · 16 of 29 slices shown, 20 images]
[im 2/29  brain]
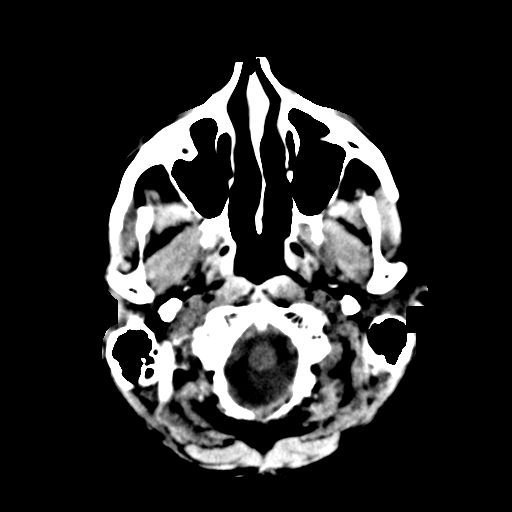
[im 2/29  bone]
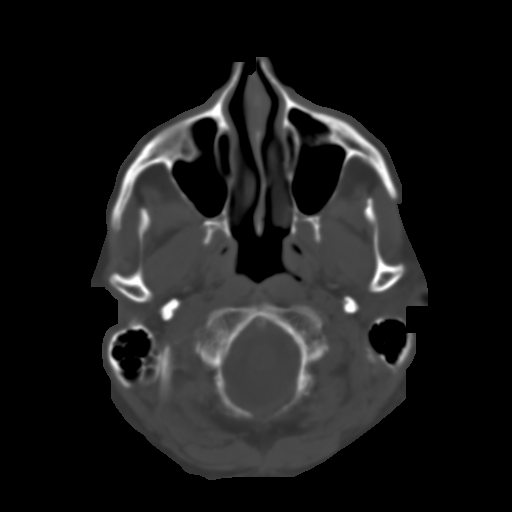
[im 4/29  brain]
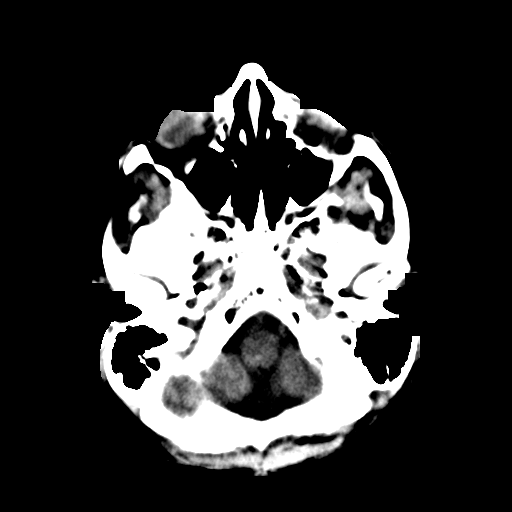
[im 6/29  brain]
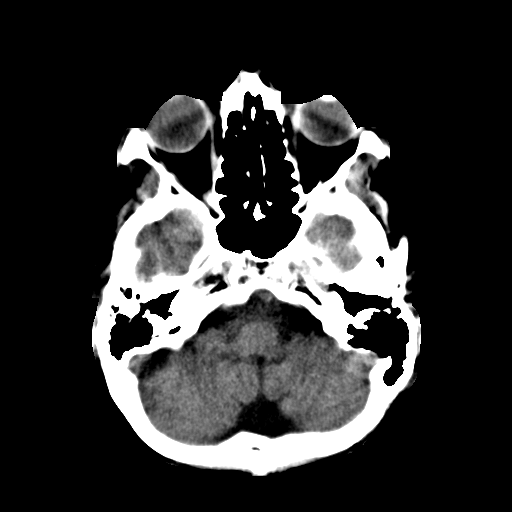
[im 7/29  brain]
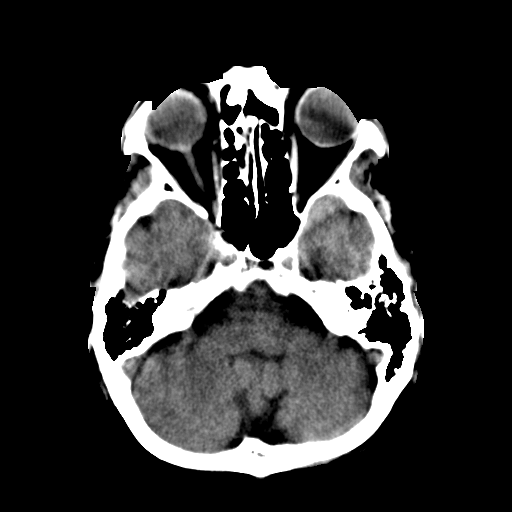
[im 9/29  brain]
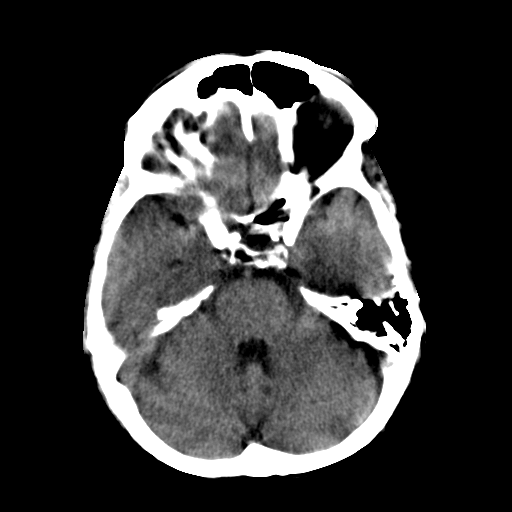
[im 9/29  bone]
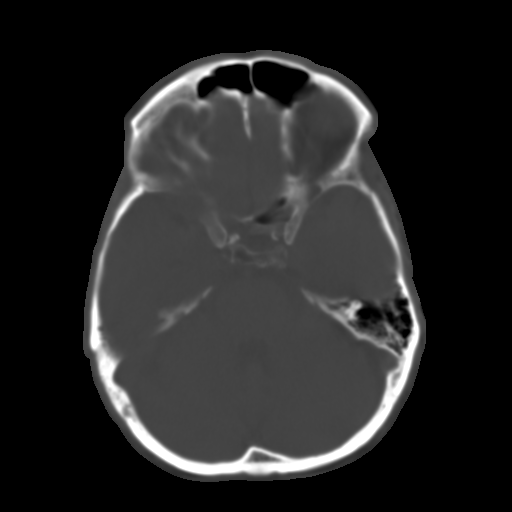
[im 11/29  brain]
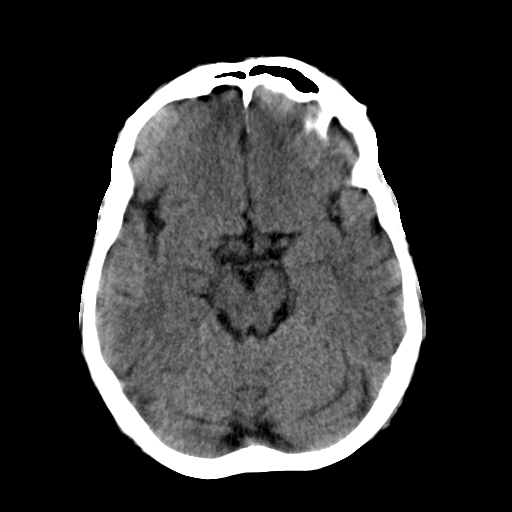
[im 12/29  brain]
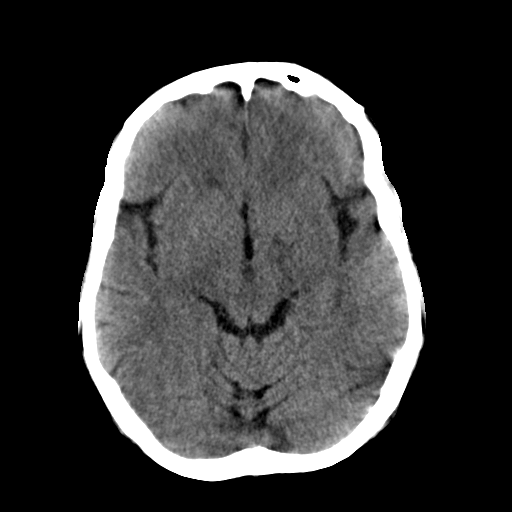
[im 14/29  brain]
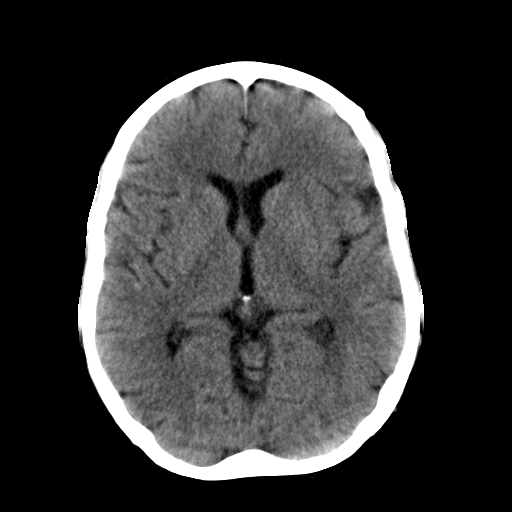
[im 16/29  brain]
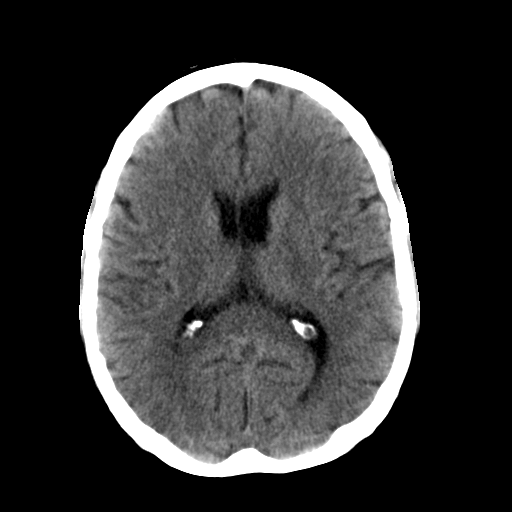
[im 16/29  bone]
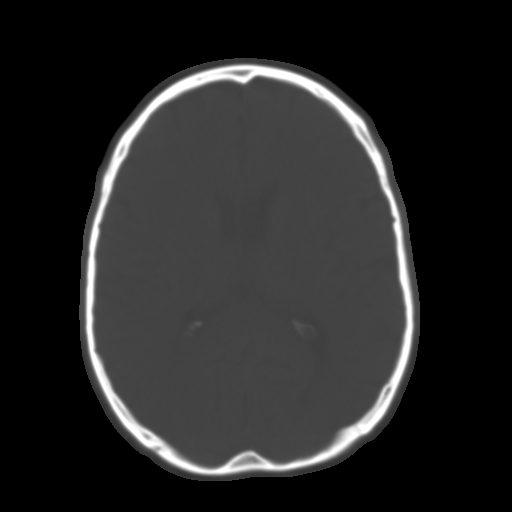
[im 18/29  brain]
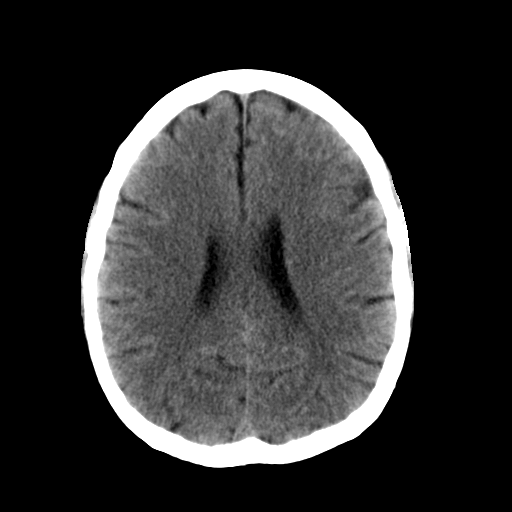
[im 19/29  brain]
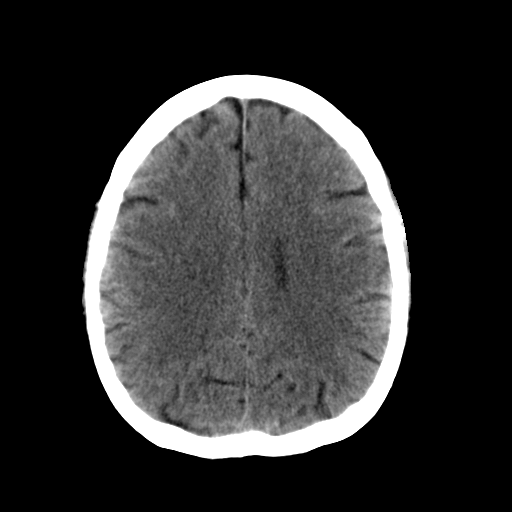
[im 21/29  brain]
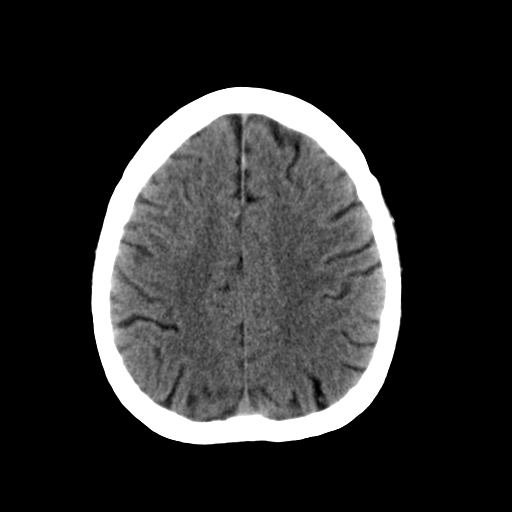
[im 23/29  brain]
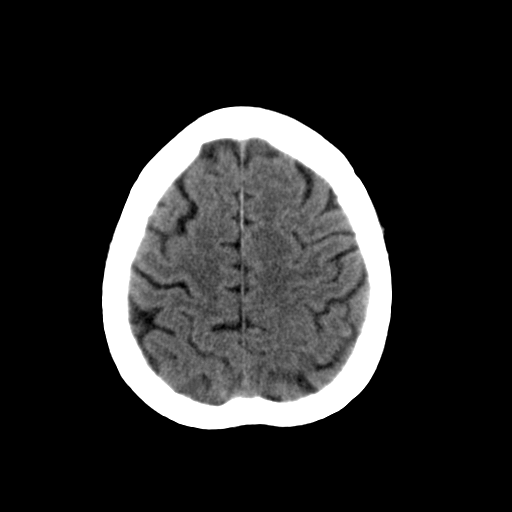
[im 23/29  bone]
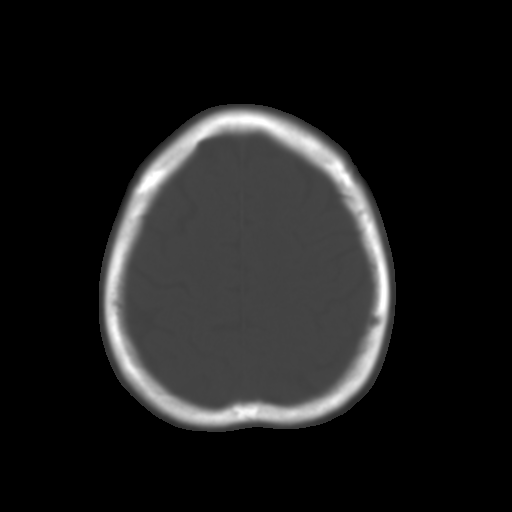
[im 24/29  brain]
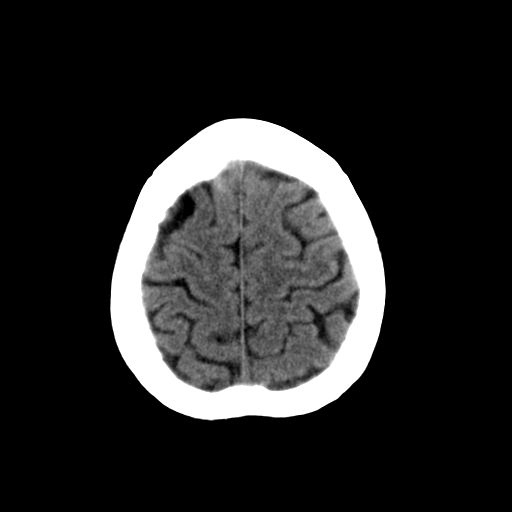
[im 26/29  brain]
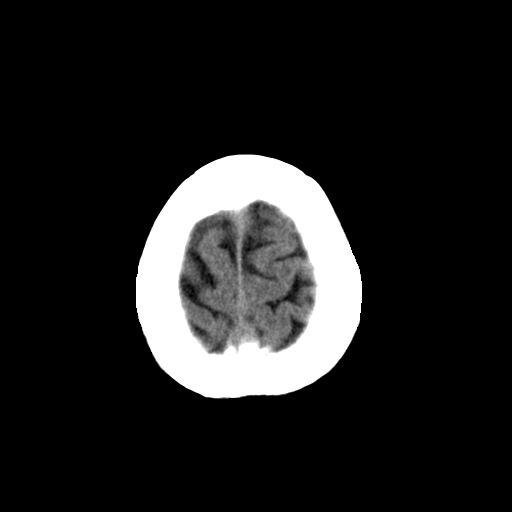
[im 28/29  brain]
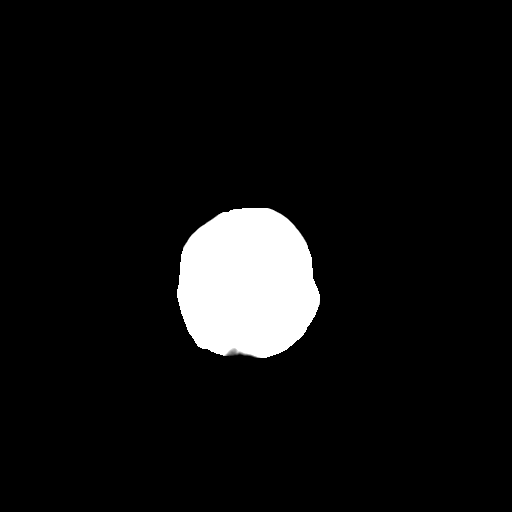

[16 of 29 positions shown; findings below may reference images not displayed]

FINDINGS: The ventricles are normal in size and position. There is no
intracranial hemorrhage nor intracranial mass effect. No acute
ischemic changes demonstrated. The cerebellum and brainstem are
normal.

The observed paranasal sinuses and mastoid air cells are clear.
There is no acute skull fracture.
IMPRESSION: Normal noncontrast CT scan of the brain.

## 2015-07-25 IMAGING — US US RENAL KIDNEY
1 series · 14 of 25 positions shown · non-contrast
Comparison: None.

CLINICAL DATA: Acute renal failure

EXAM:
RENAL/URINARY TRACT ULTRASOUND COMPLETE

[Series 1: us renal kidney · 0.22mm/px · 14 of 41 slices shown]
[im 1/41]
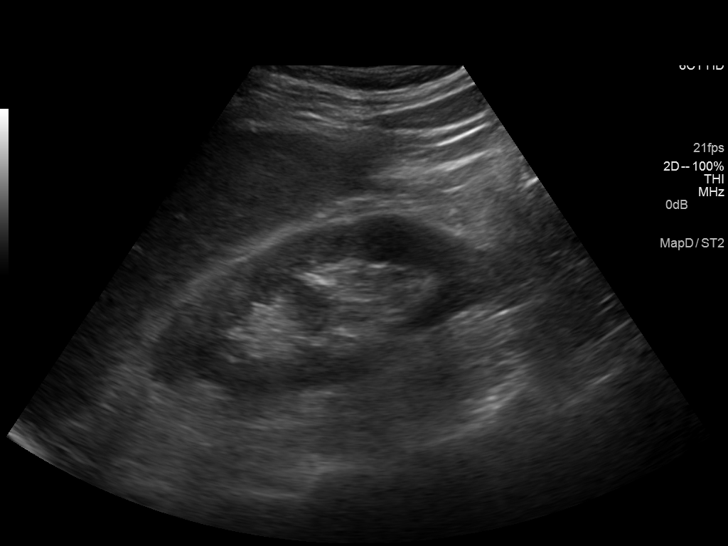
[im 4/41]
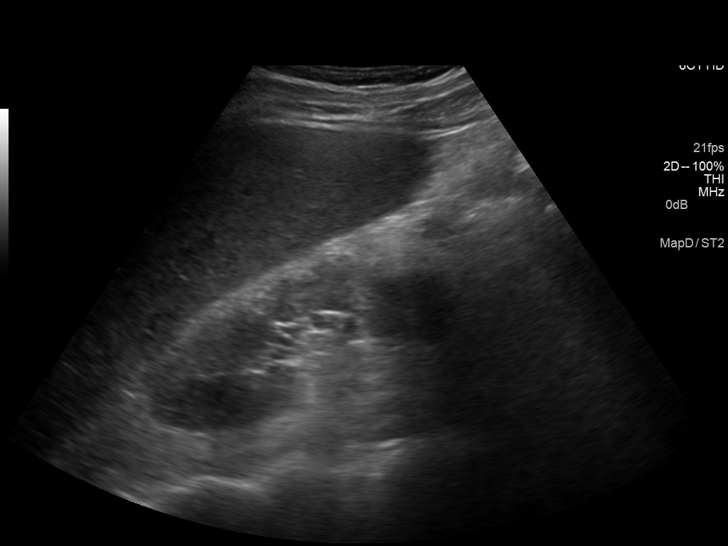
[im 7/41]
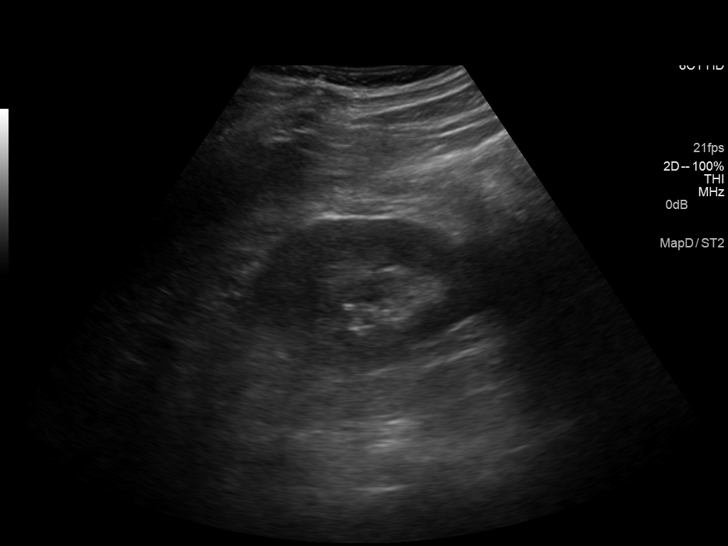
[im 11/41]
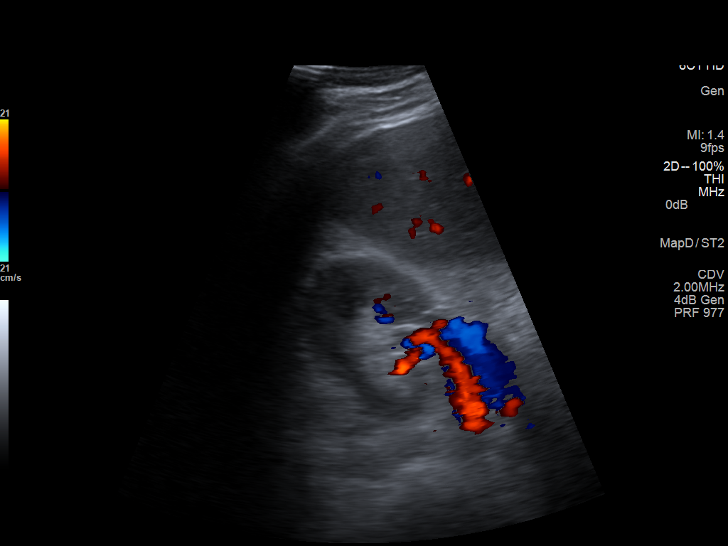
[im 14/41]
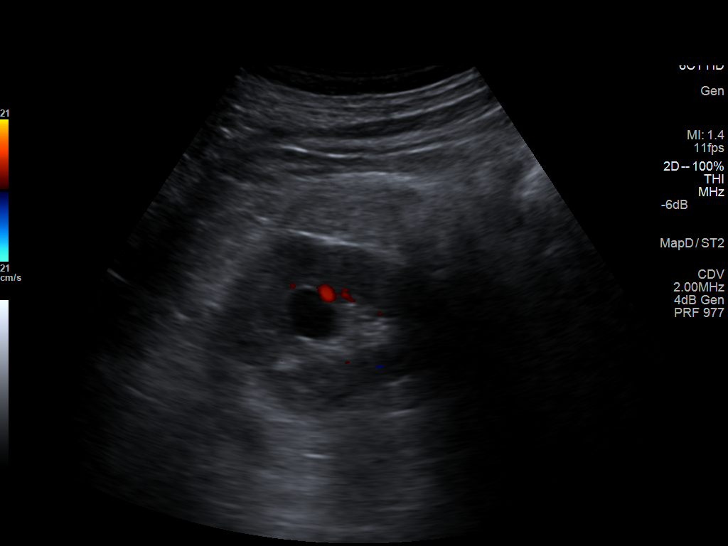
[im 16/41]
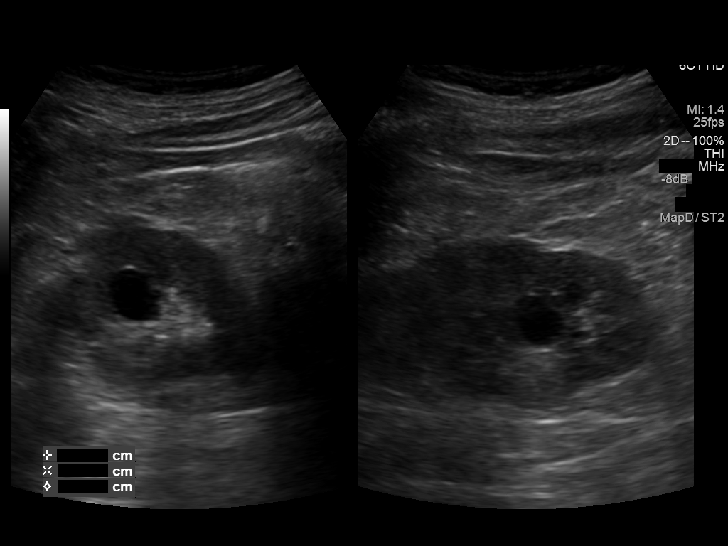
[im 19/41]
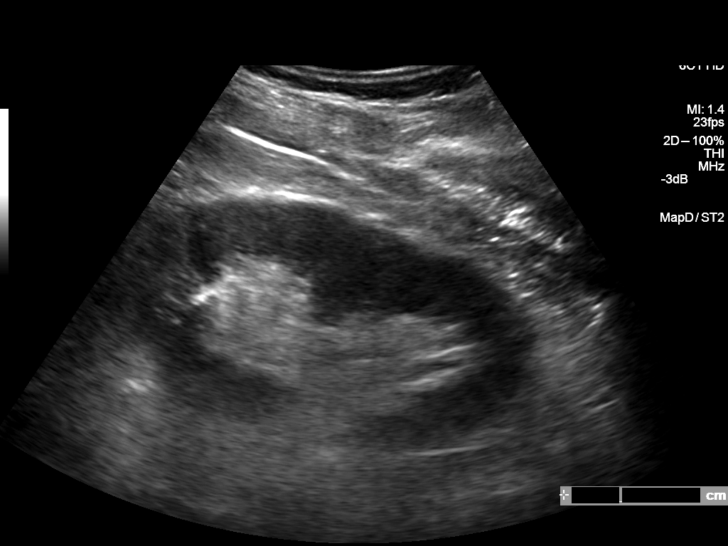
[im 22/41]
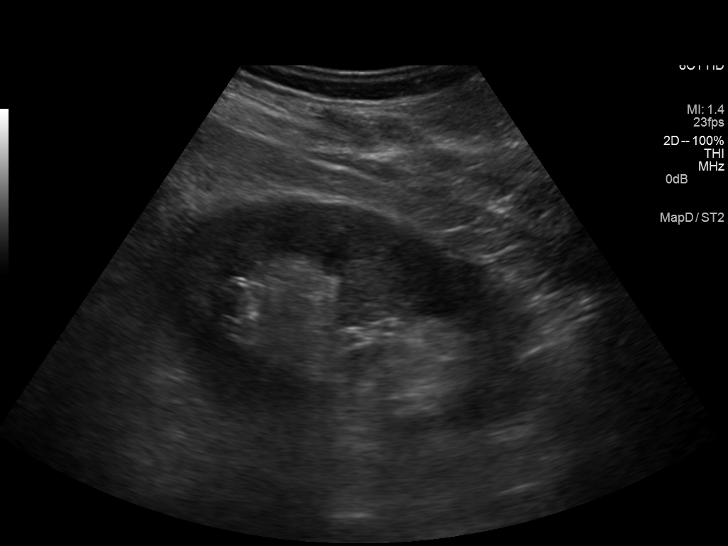
[im 26/41]
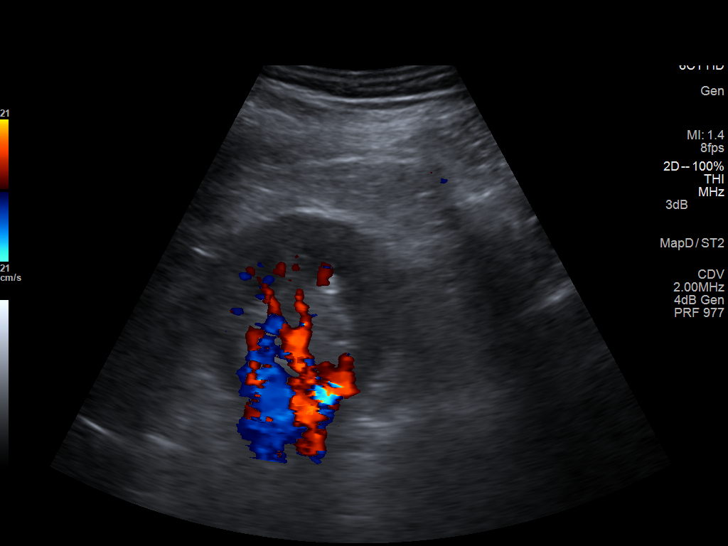
[im 27/41]
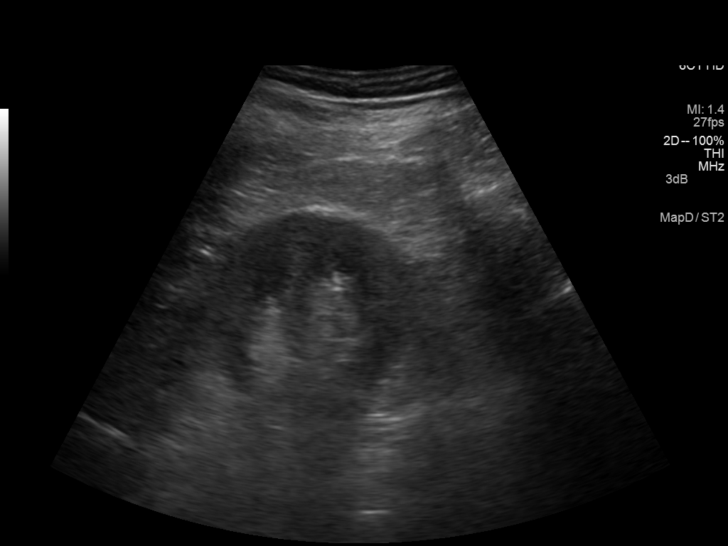
[im 31/41]
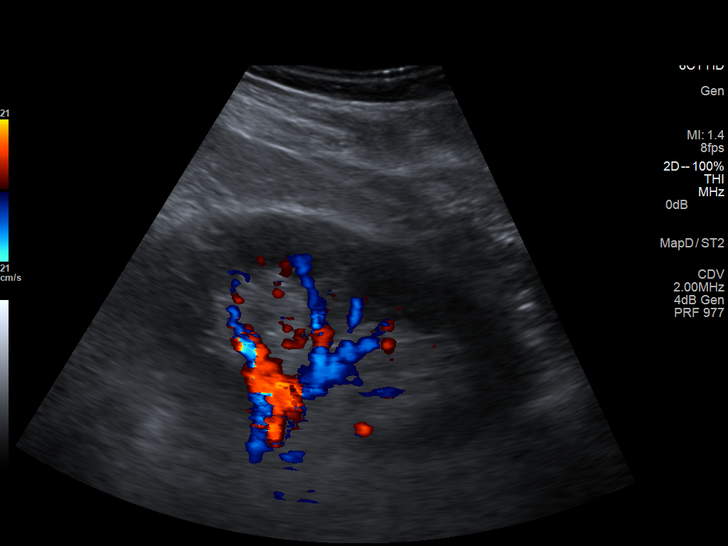
[im 34/41]
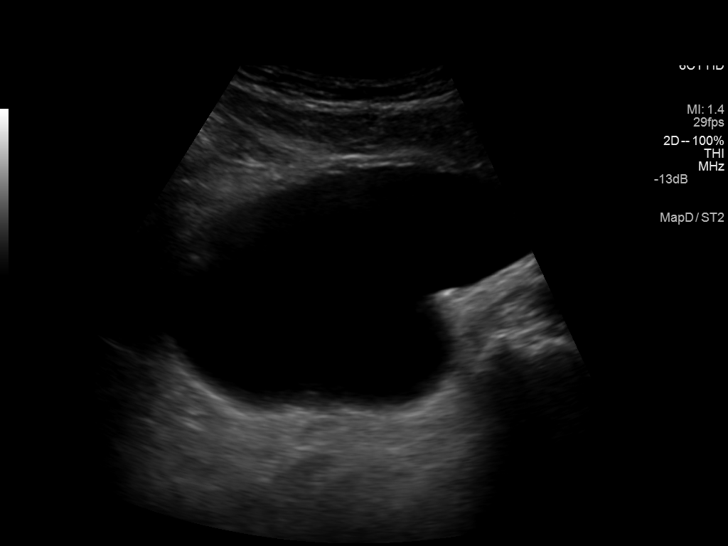
[im 37/41]
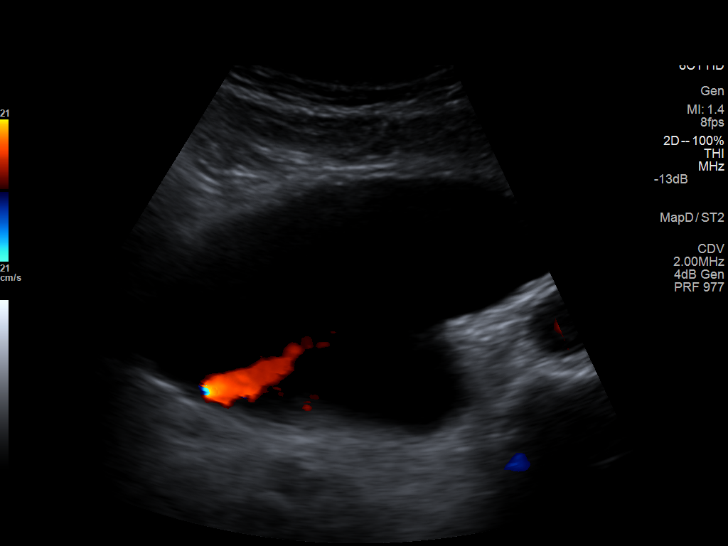
[im 41/41]
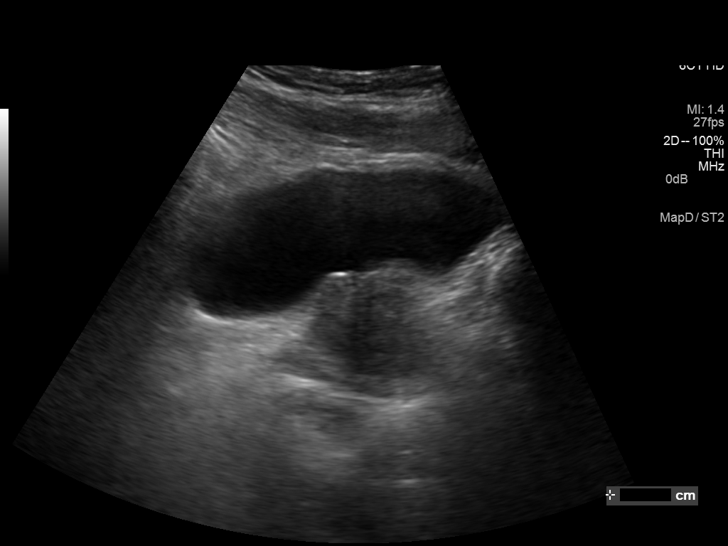

[14 of 25 positions shown; findings below may reference images not displayed]

FINDINGS: Right Kidney:

Length: 11.4 cm. Echogenicity within normal limits. No
hydronephrosis visualized. There is 1.4 x 1.5 x 1.4 cm anechoic
right lower pole renal mass with increased through transmission most
consistent with a small cyst.

Left Kidney:

Length: 11.4 cm. Echogenicity within normal limits. No mass or
hydronephrosis visualized.

Bladder:

Appears normal for degree of bladder distention.
IMPRESSION: 1. No obstructive uropathy.
2. Right renal cyst.

## 2015-07-26 IMAGING — CR DG CHEST 1V
1 series · 1 of 1 positions shown · non-contrast
Comparison: None.

CLINICAL DATA: Fever

EXAM:
CHEST - 1 VIEW

[ap]
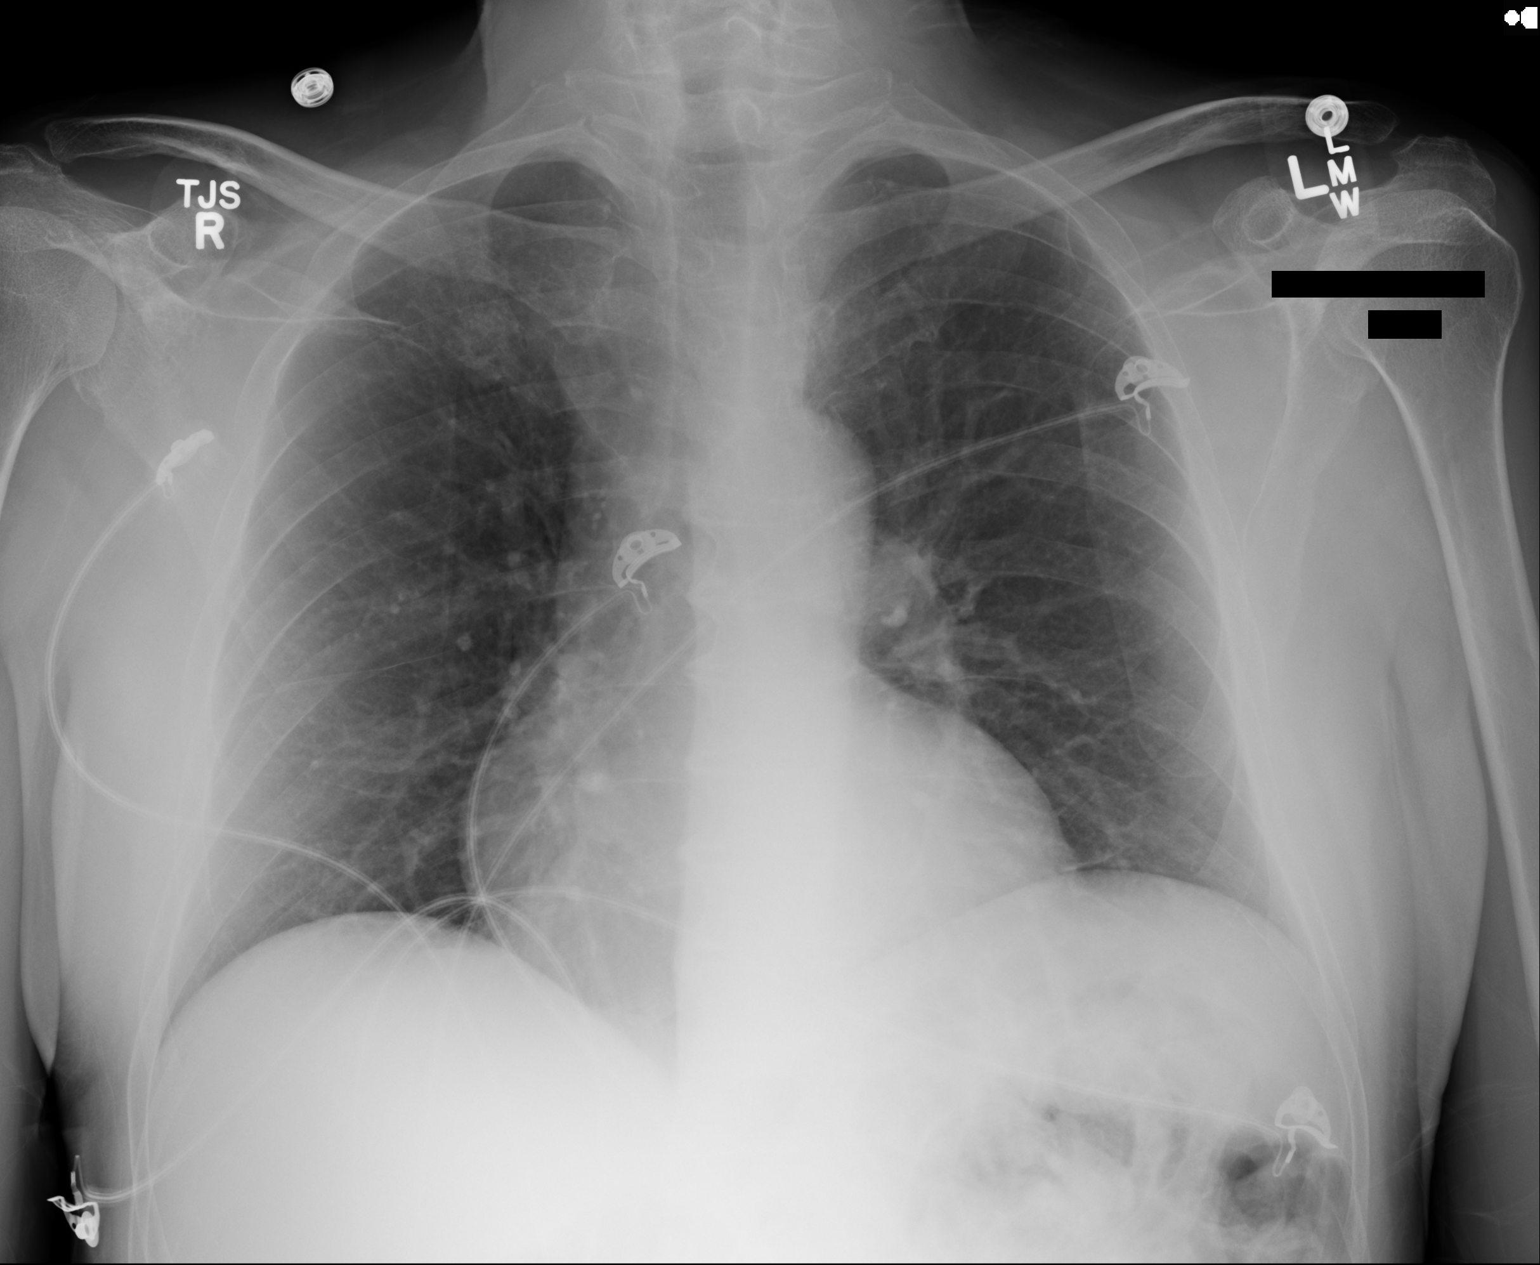

[1 of 1 positions shown; findings below may reference images not displayed]

FINDINGS: Thoracic spondylosis noted.

The lungs appear clear. Cardiac and mediastinal contours normal. No
pleural effusion identified.
IMPRESSION: 1. No visible pneumonia or specific abnormality in the chest to
explain the patient's fever.
2. Thoracic spondylosis.

## 2015-08-26 ENCOUNTER — Inpatient Hospital Stay: Payer: BLUE CROSS/BLUE SHIELD | Attending: Hematology and Oncology | Admitting: Hematology and Oncology

## 2015-12-05 ENCOUNTER — Other Ambulatory Visit: Payer: Self-pay | Admitting: Family Medicine

## 2015-12-05 DIAGNOSIS — M25561 Pain in right knee: Secondary | ICD-10-CM

## 2015-12-29 ENCOUNTER — Ambulatory Visit
Admission: RE | Admit: 2015-12-29 | Discharge: 2015-12-29 | Disposition: A | Discharge status: Home / Self Care | Payer: BLUE CROSS/BLUE SHIELD | Source: Ambulatory Visit | Attending: Family Medicine | Admitting: Family Medicine

## 2015-12-29 DIAGNOSIS — M25561 Pain in right knee: Secondary | ICD-10-CM | POA: Insufficient documentation

## 2015-12-29 DIAGNOSIS — M84351A Stress fracture, right femur, initial encounter for fracture: Secondary | ICD-10-CM | POA: Diagnosis not present

## 2015-12-29 DIAGNOSIS — S83241A Other tear of medial meniscus, current injury, right knee, initial encounter: Secondary | ICD-10-CM | POA: Diagnosis not present

## 2015-12-29 DIAGNOSIS — M659 Synovitis and tenosynovitis, unspecified: Secondary | ICD-10-CM | POA: Insufficient documentation

## 2015-12-29 DIAGNOSIS — M25461 Effusion, right knee: Secondary | ICD-10-CM | POA: Diagnosis not present

## 2016-08-31 NOTE — Discharge Instructions (Signed)
Cataract Surgery, Care After °Refer to this sheet in the next few weeks. These instructions provide you with information about caring for yourself after your procedure. Your health care provider may also give you more specific instructions. Your treatment has been planned according to current medical practices, but problems sometimes occur. Call your health care provider if you have any problems or questions after your procedure. °What can I expect after the procedure? °After the procedure, it is common to have: °· Itching. °· Discomfort. °· Fluid discharge. °· Sensitivity to light and to touch. °· Bruising. °Follow these instructions at home: °Eye Care  °· Check your eye every day for signs of infection. Watch for: °¨ Redness, swelling, or pain. °¨ Fluid, blood, or pus. °¨ Warmth. °¨ Bad smell. °Activity  °· Avoid strenuous activities, such as playing contact sports, for as long as told by your health care provider. °· Do not drive or operate heavy machinery until your health care provider approves. °· Do not bend or lift heavy objects . Bending increases pressure in the eye. You can walk, climb stairs, and do light household chores. °· Ask your health care provider when you can return to work. If you work in a dusty environment, you may be advised to wear protective eyewear for a period of time. °General instructions  °· Take or apply over-the-counter and prescription medicines only as told by your health care provider. This includes eye drops. °· Do not touch or rub your eyes. °· If you were given a protective shield, wear it as told by your health care provider. If you were not given a protective shield, wear sunglasses as told by your health care provider to protect your eyes. °· Keep the area around your eye clean and dry. Avoid swimming or allowing water to hit you directly in the face while showering until told by your health care provider. Keep soap and shampoo out of your eyes. °· Do not put a contact lens  into the affected eye or eyes until your health care provider approves. °· Keep all follow-up visits as told by your health care provider. This is important. °Contact a health care provider if: ° °· You have increased bruising around your eye. °· You have pain that is not helped with medicine. °· You have a fever. °· You have redness, swelling, or pain in your eye. °· You have fluid, blood, or pus coming from your incision. °· Your vision gets worse. °Get help right away if: °· You have sudden vision loss. °This information is not intended to replace advice given to you by your health care provider. Make sure you discuss any questions you have with your health care provider. °Document Released: 03/05/2005 Document Revised: 12/25/2015 Document Reviewed: 06/26/2015 °Elsevier Interactive Patient Education © 2017 [employer] ° ° ° ° °General Anesthesia, Adult, Care After °These instructions provide you with information about caring for yourself after your procedure. Your health care provider may also give you more specific instructions. Your treatment has been planned according to current medical practices, but problems sometimes occur. Call your health care provider if you have any problems or questions after your procedure. °What can I expect after the procedure? °After the procedure, it is common to have: °· Vomiting. °· A sore throat. °· Mental slowness. °It is common to feel: °· Nauseous. °· Cold or shivery. °· Sleepy. °· Tired. °· Sore or achy, even in parts of your body where you did not have surgery. °Follow these instructions at   home: °For at least 24 hours after the procedure:  °· Do not: °¨ Participate in activities where you could fall or become injured. °¨ Drive. °¨ Use heavy machinery. °¨ Drink alcohol. °¨ Take sleeping pills or medicines that cause drowsiness. °¨ Make important decisions or sign legal documents. °¨ Take care of children on your own. °· Rest. °Eating and drinking  °· If you vomit, drink  water, juice, or soup when you can drink without vomiting. °· Drink enough fluid to keep your urine clear or pale yellow. °· Make sure you have little or no nausea before eating solid foods. °· Follow the diet recommended by your health care provider. °General instructions  °· Have a responsible adult stay with you until you are awake and alert. °· Return to your normal activities as told by your health care provider. Ask your health care provider what activities are safe for you. °· Take over-the-counter and prescription medicines only as told by your health care provider. °· If you smoke, do not smoke without supervision. °· Keep all follow-up visits as told by your health care provider. This is important. °Contact a health care provider if: °· You continue to have nausea or vomiting at home, and medicines are not helpful. °· You cannot drink fluids or start eating again. °· You cannot urinate after 8-12 hours. °· You develop a skin rash. °· You have fever. °· You have increasing redness at the site of your procedure. °Get help right away if: °· You have difficulty breathing. °· You have chest pain. °· You have unexpected bleeding. °· You feel that you are having a life-threatening or urgent problem. °This information is not intended to replace advice given to you by your health care provider. Make sure you discuss any questions you have with your health care provider. °Document Released: 11/22/2000 Document Revised: 01/19/2016 Document Reviewed: 07/31/2015 °Elsevier Interactive Patient Education © 2017 [employer] ° °

## 2016-09-06 ENCOUNTER — Encounter: Payer: Self-pay | Admitting: *Deleted

## 2016-09-08 ENCOUNTER — Ambulatory Visit
Admission: RE | Admit: 2016-09-08 | Discharge: 2016-09-08 | Disposition: A | Discharge status: Home / Self Care | Payer: BLUE CROSS/BLUE SHIELD | Source: Ambulatory Visit | Attending: Ophthalmology | Admitting: Ophthalmology

## 2016-09-08 ENCOUNTER — Encounter
Admission: RE | Disposition: A | Discharge status: Home / Self Care | Payer: Self-pay | Source: Ambulatory Visit | Attending: Ophthalmology

## 2016-09-08 ENCOUNTER — Ambulatory Visit: Payer: BLUE CROSS/BLUE SHIELD | Admitting: Anesthesiology

## 2016-09-08 DIAGNOSIS — K219 Gastro-esophageal reflux disease without esophagitis: Secondary | ICD-10-CM | POA: Diagnosis not present

## 2016-09-08 DIAGNOSIS — I129 Hypertensive chronic kidney disease with stage 1 through stage 4 chronic kidney disease, or unspecified chronic kidney disease: Secondary | ICD-10-CM | POA: Diagnosis not present

## 2016-09-08 DIAGNOSIS — R011 Cardiac murmur, unspecified: Secondary | ICD-10-CM | POA: Insufficient documentation

## 2016-09-08 DIAGNOSIS — N183 Chronic kidney disease, stage 3 (moderate): Secondary | ICD-10-CM | POA: Diagnosis not present

## 2016-09-08 DIAGNOSIS — E1122 Type 2 diabetes mellitus with diabetic chronic kidney disease: Secondary | ICD-10-CM | POA: Insufficient documentation

## 2016-09-08 DIAGNOSIS — H2512 Age-related nuclear cataract, left eye: Secondary | ICD-10-CM | POA: Insufficient documentation

## 2016-09-08 HISTORY — PX: CATARACT EXTRACTION W/PHACO: SHX586

## 2016-09-08 HISTORY — DX: Cardiac murmur, unspecified: R01.1

## 2016-09-08 HISTORY — DX: Unspecified tear of unspecified meniscus, current injury, unspecified knee, initial encounter: S83.209A

## 2016-09-08 LAB — GLUCOSE, CAPILLARY
GLUCOSE-CAPILLARY: 79 mg/dL (ref 65–99)
GLUCOSE-CAPILLARY: 109 mg/dL — AB (ref 65–99)

## 2016-09-08 SURGERY — PHACOEMULSIFICATION, CATARACT, WITH IOL INSERTION
Anesthesia: Monitor Anesthesia Care | Site: Eye | Laterality: Left | Wound class: Clean

## 2016-09-08 MED ORDER — NA HYALUR & NA CHOND-NA HYALUR 0.4-0.35 ML IO KIT
PACK | INTRAOCULAR | Status: DC | PRN
  Administered 2016-09-08: 1 mL via INTRAOCULAR

## 2016-09-08 MED ORDER — MIDAZOLAM HCL 2 MG/2ML IJ SOLN
INTRAMUSCULAR | Status: DC | PRN
  Administered 2016-09-08: 2 mg via INTRAVENOUS

## 2016-09-08 MED ORDER — MOXIFLOXACIN HCL 0.5 % OP SOLN
1.0000 [drp] | OPHTHALMIC | Status: DC | PRN
  Administered 2016-09-08 (×3): 1 [drp] via OPHTHALMIC

## 2016-09-08 MED ORDER — EPINEPHRINE PF 1 MG/ML IJ SOLN
INTRAOCULAR | Status: DC | PRN
  Administered 2016-09-08: 50 mL via OPHTHALMIC

## 2016-09-08 MED ORDER — ARMC OPHTHALMIC DILATING DROPS
1.0000 "application " | OPHTHALMIC | Status: DC | PRN
  Administered 2016-09-08 (×3): 1 via OPHTHALMIC

## 2016-09-08 MED ORDER — CEFUROXIME OPHTHALMIC INJECTION 1 MG/0.1 ML
INJECTION | OPHTHALMIC | Status: DC | PRN
  Administered 2016-09-08: 0.1 mL via OPHTHALMIC

## 2016-09-08 MED ORDER — LACTATED RINGERS IV SOLN: INTRAVENOUS | Status: DC

## 2016-09-08 MED ORDER — LIDOCAINE HCL (PF) 4 % IJ SOLN
INTRAMUSCULAR | Status: DC | PRN
  Administered 2016-09-08: .5 mL via OPHTHALMIC

## 2016-09-08 MED ORDER — BRIMONIDINE TARTRATE-TIMOLOL 0.2-0.5 % OP SOLN
OPHTHALMIC | Status: DC | PRN
  Administered 2016-09-08: 1 [drp] via OPHTHALMIC

## 2016-09-08 MED ORDER — FENTANYL CITRATE (PF) 100 MCG/2ML IJ SOLN
INTRAMUSCULAR | Status: DC | PRN
  Administered 2016-09-08: 100 ug via INTRAVENOUS

## 2016-09-08 SURGICAL SUPPLY — 26 items
CANNULA ANT/CHMB 27GA (MISCELLANEOUS) ×2 IMPLANT
CARTRIDGE ABBOTT (MISCELLANEOUS) IMPLANT
GLOVE SURG LX 7.5 STRW (GLOVE) ×2
GLOVE SURG LX STRL 7.5 STRW (GLOVE) ×2 IMPLANT
GLOVE SURG TRIUMPH 8.0 PF LTX (GLOVE) ×2 IMPLANT
GOWN STRL REUS W/ TWL LRG LVL3 (GOWN DISPOSABLE) ×2 IMPLANT
GOWN STRL REUS W/TWL LRG LVL3 (GOWN DISPOSABLE) ×2
LENS IOL ACRSF IQ ULTRA 8.5 (Intraocular Lens) ×1 IMPLANT
LENS IOL ACRYSOF IQ 8.5 (Intraocular Lens) ×2 IMPLANT
MARKER SKIN DUAL TIP RULER LAB (MISCELLANEOUS) ×2 IMPLANT
NDL RETROBULBAR .5 NSTRL (NEEDLE) IMPLANT
NEEDLE FILTER BLUNT 18X 1/2SAF (NEEDLE) ×1
NEEDLE FILTER BLUNT 18X1 1/2 (NEEDLE) ×1 IMPLANT
PACK CATARACT BRASINGTON (MISCELLANEOUS) ×2 IMPLANT
PACK EYE AFTER SURG (MISCELLANEOUS) ×2 IMPLANT
PACK OPTHALMIC (MISCELLANEOUS) ×2 IMPLANT
RING MALYGIN 7.0 (MISCELLANEOUS) IMPLANT
SUT ETHILON 10-0 CS-B-6CS-B-6 (SUTURE)
SUT VICRYL  9 0 (SUTURE)
SUT VICRYL 9 0 (SUTURE) IMPLANT
SUTURE EHLN 10-0 CS-B-6CS-B-6 (SUTURE) IMPLANT
SYR 3ML LL SCALE MARK (SYRINGE) ×2 IMPLANT
SYR 5ML LL (SYRINGE) ×2 IMPLANT
SYR TB 1ML LUER SLIP (SYRINGE) ×2 IMPLANT
WATER STERILE IRR 250ML POUR (IV SOLUTION) ×2 IMPLANT
WIPE NON LINTING 3.25X3.25 (MISCELLANEOUS) ×2 IMPLANT

## 2016-09-08 NOTE — Anesthesia Postprocedure Evaluation (Signed)
Anesthesia Post Note  Patient: Joshua Cherry  Procedure(s) Performed: Procedure(s) (LRB): CATARACT EXTRACTION PHACO AND INTRAOCULAR LENS PLACEMENT ([facility]) (Left)  Patient location during evaluation: PACU Anesthesia Type: MAC Level of consciousness: awake and alert Pain management: pain level controlled Vital Signs Assessment: post-procedure vital signs reviewed and stable Respiratory status: spontaneous breathing, nonlabored ventilation, respiratory function stable and patient connected to nasal cannula oxygen Cardiovascular status: stable and blood pressure returned to baseline Anesthetic complications: no    Marshell Levan

## 2016-09-08 NOTE — Anesthesia Preprocedure Evaluation (Signed)
Anesthesia Evaluation  Patient identified by MRN, date of birth, ID band Patient awake    Airway Mallampati: II  TM Distance: >3 FB Neck ROM: Full    Dental   Pulmonary    Pulmonary exam normal        Cardiovascular hypertension, Normal cardiovascular exam     Neuro/Psych    GI/Hepatic GERD  Medicated and Controlled,  Endo/Other  diabetes, Well Controlled, Type 2  Renal/GU      Musculoskeletal   Abdominal   Peds  Hematology   Anesthesia Other Findings   Reproductive/Obstetrics                             Anesthesia Physical Anesthesia Plan  ASA: II  Anesthesia Plan: MAC   Post-op Pain Management:    Induction: Intravenous  Airway Management Planned:   Additional Equipment:   Intra-op Plan:   Post-operative Plan:   Informed Consent: I have reviewed the patients History and Physical, chart, labs and discussed the procedure including the risks, benefits and alternatives for the proposed anesthesia with the patient or authorized representative who has indicated his/her understanding and acceptance.     Plan Discussed with: CRNA  Anesthesia Plan Comments:         Anesthesia Quick Evaluation

## 2016-09-08 NOTE — H&P (Signed)
The History and Physical notes are on paper, have been signed, and are to be scanned. The patient remains stable and unchanged from the H&P.   Previous H&P reviewed, patient examined, and there are no changes.  Joshua Cherry 09/08/2016 7:36 AM

## 2016-09-08 NOTE — Transfer of Care (Signed)
Immediate Anesthesia Transfer of Care Note  Patient: Joshua Cherry  Procedure(s) Performed: Procedure(s) with comments: CATARACT EXTRACTION PHACO AND INTRAOCULAR LENS PLACEMENT ([place]) (Left) - DIABETIC left  Patient Location: PACU  Anesthesia Type: MAC  Level of Consciousness: awake, alert  and patient cooperative  Airway and Oxygen Therapy: Patient Spontanous Breathing and Patient connected to supplemental oxygen  Post-op Assessment: Post-op Vital signs reviewed, Patient's Cardiovascular Status Stable, Respiratory Function Stable, Patent Airway and No signs of Nausea or vomiting  Post-op Vital Signs: Reviewed and stable  Complications: No apparent anesthesia complications

## 2016-09-08 NOTE — Op Note (Signed)
OPERATIVE NOTE  FEDERICO MAIORINO [id number] 09/08/2016   PREOPERATIVE DIAGNOSIS:  Nuclear sclerotic cataract left eye. H25.12   POSTOPERATIVE DIAGNOSIS:    Nuclear sclerotic cataract left eye.     PROCEDURE:  Phacoemusification with posterior chamber intraocular lens placement of the left eye   LENS:   Implant Name Type Inv. Item Serial No. Manufacturer Lot No. LRB No. Used  LENS IOL ACRYSOF IQ 8.5 - [id number] Intraocular Lens LENS IOL ACRYSOF IQ 8.5 [id number] [employer]   Left 1        ULTRASOUND TIME: 21  % of 0 minutes 50 seconds, CDE 10.6  SURGEON:  Wyonia Hough, MD   ANESTHESIA:  Topical with tetracaine drops and 2% Xylocaine jelly, augmented with 1% preservative-free intracameral lidocaine.    COMPLICATIONS:  None.   DESCRIPTION OF PROCEDURE:  The patient was identified in the holding room and transported to the operating room and placed in the supine position under the operating microscope.  The left eye was identified as the operative eye and it was prepped and draped in the usual sterile ophthalmic fashion.   A 1 millimeter clear-corneal paracentesis was made at the 1:30 position.  0.5 ml of preservative-free 1% lidocaine was injected into the anterior chamber.  The anterior chamber was filled with Viscoat viscoelastic.  A 2.4 millimeter keratome was used to make a near-clear corneal incision at the 10:30 position.  .  A curvilinear capsulorrhexis was made with a cystotome and capsulorrhexis forceps.  Balanced salt solution was used to hydrodissect and hydrodelineate the nucleus.   Phacoemulsification was then used in stop and chop fashion to remove the lens nucleus and epinucleus.  The remaining cortex was then removed using the irrigation and aspiration handpiece. Provisc was then placed into the capsular bag to distend it for lens placement.  A lens was then injected into the capsular bag.  The remaining viscoelastic was aspirated.   Wounds were hydrated with  balanced salt solution.  The anterior chamber was inflated to a physiologic pressure with balanced salt solution.  No wound leaks were noted. Cefuroxime 0.1 ml of a 10mg /ml solution was injected into the anterior chamber for a dose of 1 mg of intracameral antibiotic at the completion of the case.   Timolol and Brimonidine drops were applied to the eye.  The patient was taken to the recovery room in stable condition without complications of anesthesia or surgery.  Ranie Chinchilla 09/08/2016, 8:04 AM

## 2016-09-08 NOTE — Anesthesia Procedure Notes (Signed)
Procedure Name: MAC Date/Time: 09/08/2016 7:42 AM Performed by: Janna Arch Pre-anesthesia Checklist: Patient identified, Emergency Drugs available, Suction available and Patient being monitored Patient Re-evaluated:Patient Re-evaluated prior to inductionOxygen Delivery Method: Nasal cannula

## 2016-09-09 ENCOUNTER — Encounter: Payer: Self-pay | Admitting: Ophthalmology

## 2016-09-18 ENCOUNTER — Encounter: Payer: Self-pay | Admitting: *Deleted

## 2016-09-24 NOTE — Discharge Instructions (Signed)
Cataract Surgery, Care After °Refer to this sheet in the next few weeks. These instructions provide you with information about caring for yourself after your procedure. Your health care provider may also give you more specific instructions. Your treatment has been planned according to current medical practices, but problems sometimes occur. Call your health care provider if you have any problems or questions after your procedure. °What can I expect after the procedure? °After the procedure, it is common to have: °· Itching. °· Discomfort. °· Fluid discharge. °· Sensitivity to light and to touch. °· Bruising. °Follow these instructions at home: °Eye Care  °· Check your eye every day for signs of infection. Watch for: °¨ Redness, swelling, or pain. °¨ Fluid, blood, or pus. °¨ Warmth. °¨ Bad smell. °Activity  °· Avoid strenuous activities, such as playing contact sports, for as long as told by your health care provider. °· Do not drive or operate heavy machinery until your health care provider approves. °· Do not bend or lift heavy objects . Bending increases pressure in the eye. You can walk, climb stairs, and do light household chores. °· Ask your health care provider when you can return to work. If you work in a dusty environment, you may be advised to wear protective eyewear for a period of time. °General instructions  °· Take or apply over-the-counter and prescription medicines only as told by your health care provider. This includes eye drops. °· Do not touch or rub your eyes. °· If you were given a protective shield, wear it as told by your health care provider. If you were not given a protective shield, wear sunglasses as told by your health care provider to protect your eyes. °· Keep the area around your eye clean and dry. Avoid swimming or allowing water to hit you directly in the face while showering until told by your health care provider. Keep soap and shampoo out of your eyes. °· Do not put a contact lens  into the affected eye or eyes until your health care provider approves. °· Keep all follow-up visits as told by your health care provider. This is important. °Contact a health care provider if: ° °· You have increased bruising around your eye. °· You have pain that is not helped with medicine. °· You have a fever. °· You have redness, swelling, or pain in your eye. °· You have fluid, blood, or pus coming from your incision. °· Your vision gets worse. °Get help right away if: °· You have sudden vision loss. °This information is not intended to replace advice given to you by your health care provider. Make sure you discuss any questions you have with your health care provider. °Document Released: 03/05/2005 Document Revised: 12/25/2015 Document Reviewed: 06/26/2015 °Elsevier Interactive Patient Education © 2017 [employer] ° ° ° ° °General Anesthesia, Adult, Care After °These instructions provide you with information about caring for yourself after your procedure. Your health care provider may also give you more specific instructions. Your treatment has been planned according to current medical practices, but problems sometimes occur. Call your health care provider if you have any problems or questions after your procedure. °What can I expect after the procedure? °After the procedure, it is common to have: °· Vomiting. °· A sore throat. °· Mental slowness. °It is common to feel: °· Nauseous. °· Cold or shivery. °· Sleepy. °· Tired. °· Sore or achy, even in parts of your body where you did not have surgery. °Follow these instructions at   home: °For at least 24 hours after the procedure:  °· Do not: °¨ Participate in activities where you could fall or become injured. °¨ Drive. °¨ Use heavy machinery. °¨ Drink alcohol. °¨ Take sleeping pills or medicines that cause drowsiness. °¨ Make important decisions or sign legal documents. °¨ Take care of children on your own. °· Rest. °Eating and drinking  °· If you vomit, drink  water, juice, or soup when you can drink without vomiting. °· Drink enough fluid to keep your urine clear or pale yellow. °· Make sure you have little or no nausea before eating solid foods. °· Follow the diet recommended by your health care provider. °General instructions  °· Have a responsible adult stay with you until you are awake and alert. °· Return to your normal activities as told by your health care provider. Ask your health care provider what activities are safe for you. °· Take over-the-counter and prescription medicines only as told by your health care provider. °· If you smoke, do not smoke without supervision. °· Keep all follow-up visits as told by your health care provider. This is important. °Contact a health care provider if: °· You continue to have nausea or vomiting at home, and medicines are not helpful. °· You cannot drink fluids or start eating again. °· You cannot urinate after 8-12 hours. °· You develop a skin rash. °· You have fever. °· You have increasing redness at the site of your procedure. °Get help right away if: °· You have difficulty breathing. °· You have chest pain. °· You have unexpected bleeding. °· You feel that you are having a life-threatening or urgent problem. °This information is not intended to replace advice given to you by your health care provider. Make sure you discuss any questions you have with your health care provider. °Document Released: 11/22/2000 Document Revised: 01/19/2016 Document Reviewed: 07/31/2015 °Elsevier Interactive Patient Education © 2017 [employer] ° °

## 2016-09-27 ENCOUNTER — Encounter: Payer: Self-pay | Admitting: *Deleted

## 2016-09-29 ENCOUNTER — Ambulatory Visit: Payer: BLUE CROSS/BLUE SHIELD | Admitting: Anesthesiology

## 2016-09-29 ENCOUNTER — Encounter
Admission: RE | Disposition: A | Discharge status: Home / Self Care | Payer: Self-pay | Source: Ambulatory Visit | Attending: Ophthalmology

## 2016-09-29 ENCOUNTER — Ambulatory Visit
Admission: RE | Admit: 2016-09-29 | Discharge: 2016-09-29 | Disposition: A | Discharge status: Home / Self Care | Payer: BLUE CROSS/BLUE SHIELD | Source: Ambulatory Visit | Attending: Ophthalmology | Admitting: Ophthalmology

## 2016-09-29 DIAGNOSIS — E1122 Type 2 diabetes mellitus with diabetic chronic kidney disease: Secondary | ICD-10-CM | POA: Diagnosis not present

## 2016-09-29 DIAGNOSIS — N189 Chronic kidney disease, unspecified: Secondary | ICD-10-CM | POA: Insufficient documentation

## 2016-09-29 DIAGNOSIS — H2511 Age-related nuclear cataract, right eye: Secondary | ICD-10-CM | POA: Insufficient documentation

## 2016-09-29 DIAGNOSIS — K219 Gastro-esophageal reflux disease without esophagitis: Secondary | ICD-10-CM | POA: Insufficient documentation

## 2016-09-29 DIAGNOSIS — I129 Hypertensive chronic kidney disease with stage 1 through stage 4 chronic kidney disease, or unspecified chronic kidney disease: Secondary | ICD-10-CM | POA: Insufficient documentation

## 2016-09-29 HISTORY — PX: CATARACT EXTRACTION W/PHACO: SHX586

## 2016-09-29 LAB — GLUCOSE, CAPILLARY
GLUCOSE-CAPILLARY: 90 mg/dL (ref 65–99)
GLUCOSE-CAPILLARY: 105 mg/dL — AB (ref 65–99)

## 2016-09-29 SURGERY — PHACOEMULSIFICATION, CATARACT, WITH IOL INSERTION
Anesthesia: Monitor Anesthesia Care | Site: Eye | Laterality: Right | Wound class: Clean

## 2016-09-29 MED ORDER — BRIMONIDINE TARTRATE-TIMOLOL 0.2-0.5 % OP SOLN
OPHTHALMIC | Status: DC | PRN
  Administered 2016-09-29: 1 [drp] via OPHTHALMIC

## 2016-09-29 MED ORDER — MIDAZOLAM HCL 2 MG/2ML IJ SOLN
INTRAMUSCULAR | Status: DC | PRN
  Administered 2016-09-29: 2 mg via INTRAVENOUS

## 2016-09-29 MED ORDER — ARMC OPHTHALMIC DILATING DROPS
1.0000 "application " | OPHTHALMIC | Status: DC | PRN
  Administered 2016-09-29 (×3): 1 via OPHTHALMIC

## 2016-09-29 MED ORDER — LIDOCAINE HCL (PF) 4 % IJ SOLN
INTRAOCULAR | Status: DC | PRN
  Administered 2016-09-29: 2 mL via OPHTHALMIC

## 2016-09-29 MED ORDER — EPINEPHRINE PF 1 MG/ML IJ SOLN
INTRAOCULAR | Status: DC | PRN
  Administered 2016-09-29: 53 mL via OPHTHALMIC

## 2016-09-29 MED ORDER — FENTANYL CITRATE (PF) 100 MCG/2ML IJ SOLN
INTRAMUSCULAR | Status: DC | PRN
  Administered 2016-09-29 (×2): 50 ug via INTRAVENOUS

## 2016-09-29 MED ORDER — MOXIFLOXACIN HCL 0.5 % OP SOLN
1.0000 [drp] | OPHTHALMIC | Status: DC | PRN
  Administered 2016-09-29 (×3): 1 [drp] via OPHTHALMIC

## 2016-09-29 MED ORDER — NA HYALUR & NA CHOND-NA HYALUR 0.4-0.35 ML IO KIT
PACK | INTRAOCULAR | Status: DC | PRN
  Administered 2016-09-29: 1 mL via INTRAOCULAR

## 2016-09-29 MED ORDER — CEFUROXIME OPHTHALMIC INJECTION 1 MG/0.1 ML
INJECTION | OPHTHALMIC | Status: DC | PRN
  Administered 2016-09-29: .3 mL via OPHTHALMIC

## 2016-09-29 SURGICAL SUPPLY — 26 items
CANNULA ANT/CHMB 27GA (MISCELLANEOUS) ×2 IMPLANT
CARTRIDGE ABBOTT (MISCELLANEOUS) IMPLANT
GLOVE SURG LX 7.5 STRW (GLOVE) ×1
GLOVE SURG LX STRL 7.5 STRW (GLOVE) ×1 IMPLANT
GLOVE SURG TRIUMPH 8.0 PF LTX (GLOVE) ×2 IMPLANT
GOWN STRL REUS W/ TWL LRG LVL3 (GOWN DISPOSABLE) ×2 IMPLANT
GOWN STRL REUS W/TWL LRG LVL3 (GOWN DISPOSABLE) ×2
LENS IOL ACRSF IQ ULTRA 8.0 (Intraocular Lens) ×1 IMPLANT
LENS IOL ACRYSOF IQ 8.0 (Intraocular Lens) ×2 IMPLANT
MARKER SKIN DUAL TIP RULER LAB (MISCELLANEOUS) ×2 IMPLANT
NDL RETROBULBAR .5 NSTRL (NEEDLE) IMPLANT
NEEDLE FILTER BLUNT 18X 1/2SAF (NEEDLE) ×1
NEEDLE FILTER BLUNT 18X1 1/2 (NEEDLE) ×1 IMPLANT
PACK CATARACT BRASINGTON (MISCELLANEOUS) ×2 IMPLANT
PACK EYE AFTER SURG (MISCELLANEOUS) ×2 IMPLANT
PACK OPTHALMIC (MISCELLANEOUS) ×2 IMPLANT
RING MALYGIN 7.0 (MISCELLANEOUS) IMPLANT
SUT ETHILON 10-0 CS-B-6CS-B-6 (SUTURE)
SUT VICRYL  9 0 (SUTURE)
SUT VICRYL 9 0 (SUTURE) IMPLANT
SUTURE EHLN 10-0 CS-B-6CS-B-6 (SUTURE) IMPLANT
SYR 3ML LL SCALE MARK (SYRINGE) ×2 IMPLANT
SYR 5ML LL (SYRINGE) ×2 IMPLANT
SYR TB 1ML LUER SLIP (SYRINGE) ×2 IMPLANT
WATER STERILE IRR 250ML POUR (IV SOLUTION) ×2 IMPLANT
WIPE NON LINTING 3.25X3.25 (MISCELLANEOUS) ×2 IMPLANT

## 2016-09-29 NOTE — Transfer of Care (Signed)
Immediate Anesthesia Transfer of Care Note  Patient: Joshua Cherry  Procedure(s) Performed: Procedure(s) with comments: CATARACT EXTRACTION PHACO AND INTRAOCULAR LENS PLACEMENT ([facility])  right eye (Right) - Diabetic - oral meds Right Eye IVA Topical  Patient Location: PACU  Anesthesia Type: MAC  Level of Consciousness: awake, alert  and patient cooperative  Airway and Oxygen Therapy: Patient Spontanous Breathing and Patient connected to supplemental oxygen  Post-op Assessment: Post-op Vital signs reviewed, Patient's Cardiovascular Status Stable, Respiratory Function Stable, Patent Airway and No signs of Nausea or vomiting  Post-op Vital Signs: Reviewed and stable  Complications: No apparent anesthesia complications

## 2016-09-29 NOTE — H&P (Signed)
The History and Physical notes are on paper, have been signed, and are to be scanned. The patient remains stable and unchanged from the H&P.   Previous H&P reviewed, patient examined, and there are no changes.  Joshua Cherry 09/29/2016 7:42 AM

## 2016-09-29 NOTE — Op Note (Signed)
LOCATION:  [facility]   PREOPERATIVE DIAGNOSIS:    Nuclear sclerotic cataract right eye. H25.11   POSTOPERATIVE DIAGNOSIS:  Nuclear sclerotic cataract right eye.     PROCEDURE:  Phacoemusification with posterior chamber intraocular lens placement of the right eye   LENS:   Implant Name Type Inv. Item Serial No. Manufacturer Lot No. LRB No. Used  LENS IOL ACRYSOF IQ 8.0 - [id number] Intraocular Lens LENS IOL ACRYSOF IQ 8.0 [id number] ALCON   Right 1        ULTRASOUND TIME: 19 % of 1 minutes, 2 seconds.  CDE 12.0   SURGEON:  Wyonia Hough, MD   ANESTHESIA:  Topical with tetracaine drops and 2% Xylocaine jelly, augmented with 1% preservative-free intracameral lidocaine.    COMPLICATIONS:  None.   DESCRIPTION OF PROCEDURE:  The patient was identified in the holding room and transported to the operating room and placed in the supine position under the operating microscope.  The right eye was identified as the operative eye and it was prepped and draped in the usual sterile ophthalmic fashion.   A 1 millimeter clear-corneal paracentesis was made at the 12:00 position.  0.5 ml of preservative-free 1% lidocaine was injected into the anterior chamber. The anterior chamber was filled with Viscoat viscoelastic.  A 2.4 millimeter keratome was used to make a near-clear corneal incision at the 9:00 position.  A curvilinear capsulorrhexis was made with a cystotome and capsulorrhexis forceps.  Balanced salt solution was used to hydrodissect and hydrodelineate the nucleus.   Phacoemulsification was then used in stop and chop fashion to remove the lens nucleus and epinucleus.  The remaining cortex was then removed using the irrigation and aspiration handpiece. Provisc was then placed into the capsular bag to distend it for lens placement.  A lens was then injected into the capsular bag.  The remaining viscoelastic was aspirated.   Wounds were hydrated with balanced salt solution.   The anterior chamber was inflated to a physiologic pressure with balanced salt solution.  No wound leaks were noted. Cefuroxime 0.1 ml of a 10mg /ml solution was injected into the anterior chamber for a dose of 1 mg of intracameral antibiotic at the completion of the case.   Timolol and Brimonidine drops were applied to the eye.  The patient was taken to the recovery room in stable condition without complications of anesthesia or surgery.   Tula Schryver 09/29/2016, 8:07 AM

## 2016-09-29 NOTE — Anesthesia Postprocedure Evaluation (Signed)
Anesthesia Post Note  Patient: Joshua Cherry  Procedure(s) Performed: Procedure(s) (LRB): CATARACT EXTRACTION PHACO AND INTRAOCULAR LENS PLACEMENT (IOC)  right eye (Right)  Patient location during evaluation: PACU Anesthesia Type: MAC Level of consciousness: awake and alert Pain management: pain level controlled Vital Signs Assessment: post-procedure vital signs reviewed and stable Respiratory status: spontaneous breathing, nonlabored ventilation, respiratory function stable and patient connected to nasal cannula oxygen Cardiovascular status: stable and blood pressure returned to baseline Anesthetic complications: no    Alisa Graff

## 2016-09-29 NOTE — Anesthesia Preprocedure Evaluation (Signed)
Anesthesia Evaluation  Patient identified by MRN, date of birth, ID band Patient awake    Reviewed: Allergy & Precautions, H&P , NPO status , Patient's Chart, lab work & pertinent test results, reviewed documented beta blocker date and time   Airway Mallampati: II  TM Distance: >3 FB Neck ROM: full    Dental no notable dental hx.    Pulmonary neg pulmonary ROS,    Pulmonary exam normal breath sounds clear to auscultation       Cardiovascular Exercise Tolerance: Good hypertension,  Rhythm:regular Rate:Normal     Neuro/Psych negative neurological ROS  negative psych ROS   GI/Hepatic Neg liver ROS, GERD  ,  Endo/Other  diabetes, Type 2  Renal/GU CRF  negative genitourinary   Musculoskeletal   Abdominal   Peds  Hematology negative hematology ROS (+)   Anesthesia Other Findings   Reproductive/Obstetrics negative OB ROS                             Anesthesia Physical Anesthesia Plan  ASA: II  Anesthesia Plan: MAC   Post-op Pain Management:    Induction:   Airway Management Planned:   Additional Equipment:   Intra-op Plan:   Post-operative Plan:   Informed Consent: I have reviewed the patients History and Physical, chart, labs and discussed the procedure including the risks, benefits and alternatives for the proposed anesthesia with the patient or authorized representative who has indicated his/her understanding and acceptance.   Dental Advisory Given  Plan Discussed with: CRNA  Anesthesia Plan Comments:         Anesthesia Quick Evaluation

## 2016-11-11 ENCOUNTER — Encounter: Payer: Self-pay | Admitting: *Deleted

## 2016-12-10 ENCOUNTER — Ambulatory Visit: Payer: Self-pay

## 2016-12-10 DIAGNOSIS — E538 Deficiency of other specified B group vitamins: Secondary | ICD-10-CM

## 2016-12-10 DIAGNOSIS — I10 Essential (primary) hypertension: Secondary | ICD-10-CM

## 2016-12-10 DIAGNOSIS — E139 Other specified diabetes mellitus without complications: Secondary | ICD-10-CM

## 2016-12-10 MED ORDER — CYANOCOBALAMIN 1000 MCG/ML IJ SOLN
1000.0000 ug | Freq: Once | INTRAMUSCULAR | Status: AC
  Administered 2016-12-10: 1000 ug via INTRAMUSCULAR

## 2016-12-11 LAB — CBC WITH DIFFERENTIAL/PLATELET
Basophils Absolute: 0 10*3/uL (ref 0.0–0.2)
Basos: 0 %
EOS (ABSOLUTE): 0.2 10*3/uL (ref 0.0–0.4)
EOS: 4 %
HEMATOCRIT: 30.3 % — AB (ref 37.5–51.0)
HEMOGLOBIN: 9.9 g/dL — AB (ref 13.0–17.7)
Immature Grans (Abs): 0 10*3/uL (ref 0.0–0.1)
Immature Granulocytes: 0 %
LYMPHS ABS: 1.3 10*3/uL (ref 0.7–3.1)
Lymphs: 24 %
MCH: 31.5 pg (ref 26.6–33.0)
MCHC: 32.7 g/dL (ref 31.5–35.7)
MCV: 97 fL (ref 79–97)
MONOCYTES: 9 %
Monocytes Absolute: 0.5 10*3/uL (ref 0.1–0.9)
NEUTROS ABS: 3.3 10*3/uL (ref 1.4–7.0)
Neutrophils: 63 %
PLATELETS: 244 10*3/uL (ref 150–379)
RBC: 3.14 x10E6/uL — ABNORMAL LOW (ref 4.14–5.80)
RDW: 14.4 % (ref 12.3–15.4)
WBC: 5.3 10*3/uL (ref 3.4–10.8)

## 2016-12-11 LAB — COMPREHENSIVE METABOLIC PANEL
ALBUMIN: 4.2 g/dL (ref 3.6–4.8)
ALT: 16 IU/L (ref 0–44)
AST: 22 IU/L (ref 0–40)
Albumin/Globulin Ratio: 1.8 (ref 1.2–2.2)
Alkaline Phosphatase: 53 IU/L (ref 39–117)
BUN/Creatinine Ratio: 22 (ref 10–24)
BUN: 40 mg/dL — AB (ref 8–27)
CHLORIDE: 104 mmol/L (ref 96–106)
CO2: 24 mmol/L (ref 18–29)
Calcium: 9.4 mg/dL (ref 8.6–10.2)
Creatinine, Ser: 1.8 mg/dL — ABNORMAL HIGH (ref 0.76–1.27)
GFR calc non Af Amer: 38 mL/min/{1.73_m2} — ABNORMAL LOW (ref >59)
GFR, EST AFRICAN AMERICAN: 44 mL/min/{1.73_m2} — AB (ref >59)
GLUCOSE: 90 mg/dL (ref 65–99)
Globulin, Total: 2.4 g/dL (ref 1.5–4.5)
Potassium: 4.7 mmol/L (ref 3.5–5.2)
Sodium: 141 mmol/L (ref 134–144)
TOTAL PROTEIN: 6.6 g/dL (ref 6.0–8.5)

## 2016-12-11 LAB — LIPID PANEL
CHOLESTEROL TOTAL: 162 mg/dL (ref 100–199)
Chol/HDL Ratio: 3.5 ratio (ref 0.0–5.0)
HDL: 46 mg/dL (ref >39)
LDL Calculated: 90 mg/dL (ref 0–99)
Triglycerides: 132 mg/dL (ref 0–149)
VLDL CHOLESTEROL CAL: 26 mg/dL (ref 5–40)

## 2016-12-11 LAB — HGB A1C W/O EAG: HEMOGLOBIN A1C: 6.9 % — AB (ref 4.8–5.6)

## 2016-12-17 ENCOUNTER — Ambulatory Visit: Payer: Self-pay

## 2016-12-17 DIAGNOSIS — Z23 Encounter for immunization: Secondary | ICD-10-CM

## 2017-03-01 ENCOUNTER — Other Ambulatory Visit: Payer: Self-pay

## 2017-03-01 DIAGNOSIS — I10 Essential (primary) hypertension: Secondary | ICD-10-CM

## 2017-03-01 DIAGNOSIS — N401 Enlarged prostate with lower urinary tract symptoms: Secondary | ICD-10-CM

## 2017-03-01 DIAGNOSIS — E538 Deficiency of other specified B group vitamins: Secondary | ICD-10-CM

## 2017-03-01 MED ORDER — CYANOCOBALAMIN 1000 MCG/ML IJ SOLN
1000.0000 ug | Freq: Once | INTRAMUSCULAR | Status: AC
  Administered 2017-03-01: 1000 ug via INTRAMUSCULAR

## 2017-03-01 NOTE — Addendum Note (Signed)
Addended by: Maura Crandall on: 03/01/2017 10:41 AM   Modules accepted: Orders

## 2017-03-02 LAB — SPECIMEN STATUS

## 2017-03-08 LAB — PSA: Prostate Specific Ag, Serum: 2.5 ng/mL (ref 0.0–4.0)

## 2017-04-27 DIAGNOSIS — N289 Disorder of kidney and ureter, unspecified: Secondary | ICD-10-CM | POA: Insufficient documentation

## 2017-04-29 ENCOUNTER — Encounter: Payer: Self-pay | Admitting: Adult Health

## 2017-04-29 ENCOUNTER — Ambulatory Visit
Admission: RE | Admit: 2017-04-29 | Discharge: 2017-04-29 | Disposition: A | Discharge status: Home / Self Care | Payer: BLUE CROSS/BLUE SHIELD | Source: Ambulatory Visit | Attending: Adult Health | Admitting: Adult Health

## 2017-04-29 ENCOUNTER — Ambulatory Visit: Payer: Self-pay | Admitting: Adult Health

## 2017-04-29 ENCOUNTER — Ambulatory Visit: Payer: Self-pay

## 2017-04-29 ENCOUNTER — Telehealth: Payer: Self-pay | Admitting: Adult Health

## 2017-04-29 VITALS — BP 132/62 | HR 65 | Temp 97.1°F | Wt 177.8 lb

## 2017-04-29 DIAGNOSIS — N289 Disorder of kidney and ureter, unspecified: Secondary | ICD-10-CM

## 2017-04-29 DIAGNOSIS — M25572 Pain in left ankle and joints of left foot: Secondary | ICD-10-CM

## 2017-04-29 DIAGNOSIS — E538 Deficiency of other specified B group vitamins: Secondary | ICD-10-CM

## 2017-04-29 DIAGNOSIS — M79672 Pain in left foot: Secondary | ICD-10-CM

## 2017-04-29 DIAGNOSIS — M7732 Calcaneal spur, left foot: Secondary | ICD-10-CM | POA: Diagnosis not present

## 2017-04-29 DIAGNOSIS — E139 Other specified diabetes mellitus without complications: Secondary | ICD-10-CM | POA: Insufficient documentation

## 2017-04-29 MED ORDER — CYANOCOBALAMIN 1000 MCG/ML IJ SOLN
1000.0000 ug | Freq: Once | INTRAMUSCULAR | Status: AC
  Administered 2017-04-29: 1000 ug via INTRAMUSCULAR

## 2017-04-29 NOTE — Telephone Encounter (Addendum)
Left message to return call to office 04/29/17 at 1:36pm..  Also left message at 2:30pm on 04/29/17.  Patient returned call and results given as below 04/28/17 at 3:18PM.   When patient returns call will  review x-ray results and inform patient x ray of foot showed mild lateral soft tissue swelling with no acute abnormalities as below.  Continue as instructed in office visit/ Tylenol only for pain. REST ICE ELEVATION AND COMPRESSION. If pain persists past seven days or if symptoms worsen at any time see your primary care MD [name], MD. Apply a compressive ACE bandage. Rest and elevate the affected painful area.  Apply cold compresses intermittently as needed.  As pain recedes, begin normal activities slowly as tolerated.  Call if symptoms persist.   Return to clinic at any time  if any new symptoms change, worsen or do not improve. Symptoms should improve  within 72 hours and if not improving you should call for an appointment at the clinic or bee seen in urgent care/ED if clinic is closed.   Patient verbalized understanding of instructions and denies any further questions at this time.        DG Ankle Complete Left [[id number]] Resulted: 04/29/17 1012  Order Status: Completed Updated: 04/29/17 1014  Narrative:   CLINICAL DATA: Left foot and ankle pain for 4 weeks. No known injury.  EXAM: LEFT ANKLE COMPLETE - 3+ VIEW  COMPARISON: None.  FINDINGS: Mild lateral soft tissue swelling. No acute bony abnormality. Specifically, no fracture, subluxation, or dislocation. Soft tissues are intact. Joint spaces are maintained.  IMPRESSION: No acute bony abnormality.   Electronically Signed By: Rolm Baptise M.D. On: 04/29/2017 10:12  DG Foot Complete Left [[id number]] Resulted: 04/29/17 1012  Order Status: Completed Updated: 04/29/17 1014  Narrative:   CLINICAL DATA: Left foot and ankle pain for 4 weeks. No known injury.  EXAM: LEFT FOOT - COMPLETE 3+  VIEW  COMPARISON: None.  FINDINGS: Plantar calcaneal spur. No acute bony abnormality. Specifically, no fracture, subluxation, or dislocation. Soft tissues are intact. Joint spaces are maintained.  IMPRESSION: Calcaneal spur. No acute bony abnormality.   Electronically Signed By: Rolm Baptise M.D. On: 04/29/2017 10:12

## 2017-04-29 NOTE — Progress Notes (Signed)
Subjective:     Patient ID: Joshua Cherry, male   DOB: 09-29-1950, 66 y.o.   MRN: [id number]  HPI Patient is a 66 year old male inno acute distress with left foot pain. He denies any known injury. He reports he had some swollen for a few days but not swollen as much now. " uncomfortable" he reports 4-5/10 on pain scale when walking. He denies any bruising or color or temperature change. He denies any pain in his leg or hip. He denies any wounds or skin breaks. No history of gout.   He denies taking any pain medications.  Denies any surgery in his left leg/ foot previously. He denies any other symptoms   Blood pressure 132/62, pulse 65, temperature (!) 97.1 F (36.2 C), weight 177 lb 12.8 oz (80.6 kg), SpO2 98 %.temperature was tympanic  Patient Active Problem List   Diagnosis Date Noted  . 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 Abnormal kidney function 04/27/2017  . Anemia 02/22/2015  . Diabetes mellitus type 1.5 ([place]) 02/21/2015  . Benign fibroma of prostate 03/19/2014  . Elevated lipids 03/19/2014  . Acid reflux 03/19/2014  . Diabetes mellitus, type 2 ([place]) 03/19/2014  . BP (high blood pressure) 03/19/2014  . Anemia, iron deficiency 03/19/2014  . Benign prostatic hyperplasia 03/19/2014  . DM (diabetes mellitus), type 2 with renal complications ([place]) 94/76/5465  . GERD (gastroesophageal reflux disease) 03/19/2014  . Hypertension 03/19/2014  . IDA (iron deficiency anemia) 03/19/2014   Past Surgical History:  Procedure Laterality Date  . CATARACT EXTRACTION W/PHACO Left 09/08/2016   Procedure: CATARACT EXTRACTION PHACO AND INTRAOCULAR LENS PLACEMENT (IOC);  Surgeon: Leandrew Koyanagi, MD;  Location: [facility];  Service: Ophthalmology;  Laterality: Left;  DIABETIC left  . CATARACT EXTRACTION W/PHACO Right 09/29/2016   Procedure: CATARACT EXTRACTION PHACO AND INTRAOCULAR LENS PLACEMENT ([place])  right eye;  Surgeon: Leandrew Koyanagi, MD;  Location: [facility];  Service: Ophthalmology;  Laterality: Right;  Diabetic - oral meds Right Eye IVA Topical  . COLONOSCOPY    . COLONOSCOPY    . ESOPHAGOGASTRODUODENOSCOPY    . ESOPHAGOGASTRODUODENOSCOPY      Social History   Social History  . Marital status: Married    Spouse name: N/A  . Number of children: N/A  . Years of education: N/A   Occupational History  . Not on file.   Social History Main Topics  . Smoking status: Never Smoker  . Smokeless tobacco: Never Used  . Alcohol use No     Comment: former drinker  . Drug use: No  . Sexual activity: Not on file   Other Topics Concern  . Not on file   Social History Narrative  . No narrative on file     Vitals:   04/29/17 0751  Weight: 177 lb 12.8 oz (80.6 kg)    Current Outpatient Prescriptions:  .  aspirin EC 81 MG tablet, Take 81 mg by mouth daily. , Disp: , Rfl:  .  B-Complex-C CAPS, Take 1 capsule by mouth daily. , Disp: , Rfl:  .  carvedilol (COREG) 3.125 MG tablet, Take 3.125 mg by mouth., Disp: , Rfl:  .  chlorthalidone (HYGROTON) 25 MG tablet, Take 25 mg by mouth daily., Disp: , Rfl:  .  Cholecalciferol (VITAMIN D3) 2000 UNITS capsule, Take 2,000 Units by mouth daily. , Disp: , Rfl:  .  cyanocobalamin (,VITAMIN B-12,) 1000 MCG/ML injection, Inject 1,000 mcg into the muscle every 30 (thirty)  days. , Disp: , Rfl:  .  ferrous sulfate 325 (65 FE) MG tablet, Take 325 mg by mouth daily with breakfast. , Disp: , Rfl:  .  finasteride (PROSCAR) 5 MG tablet, Take 5 mg by mouth daily. , Disp: , Rfl:  .  lisinopril (PRINIVIL,ZESTRIL) 40 MG tablet, Take 40 mg by mouth daily., Disp: , Rfl:  .  lovastatin (MEVACOR) 20 MG tablet, Take 40 mg by mouth daily at 6 PM. , Disp: , Rfl:  .  omeprazole (PRILOSEC) 20 MG capsule, Take 20 mg by mouth 2 (two) times daily before a meal. , Disp: , Rfl:  .  ONETOUCH DELICA LANCETS 99M MISC, , Disp: , Rfl:  .  pioglitazone (ACTOS) 45 MG tablet, Take 45 mg by mouth daily. , Disp: , Rfl:  .   sitaGLIPtin (JANUVIA) 25 MG tablet, Take 25 mg by mouth daily. , Disp: , Rfl:  .  tamsulosin (FLOMAX) 0.4 MG CAPS capsule, Take 0.4 mg by mouth daily after breakfast. , Disp: , Rfl:  Blood pressure 132/62, pulse 65, temperature (!) 97.1 F (36.2 C), weight 177 lb 12.8 oz (80.6 kg), SpO2 98 %. Temperature 98.1 tympanic recheck. Review of Systems  Constitutional: Negative.   HENT: Negative.   Respiratory: Negative.   Cardiovascular: Negative.   Gastrointestinal: Negative.   Endocrine: Negative.   Genitourinary: Negative.   Musculoskeletal: Positive for gait problem (limp with walk - guards left foot ) and joint swelling.  Allergic/Immunologic: Negative.   Neurological: Negative for dizziness, tremors, seizures, syncope, facial asymmetry, speech difficulty, weakness, light-headedness, numbness and headaches.  Hematological: Negative.   Psychiatric/Behavioral: Negative.        Objective:   Physical Exam  Constitutional: He is oriented to person, place, and time. He appears well-developed and well-nourished. No distress.  HENT:  Head: Normocephalic and atraumatic.  Nose: Nose normal.  Mouth/Throat: Oropharynx is clear and moist. No oropharyngeal exudate.  Eyes: Pupils are equal, round, and reactive to light. Conjunctivae and EOM are normal. Right eye exhibits no discharge. Left eye exhibits no discharge. No scleral icterus.  Neck: Normal range of motion. Neck supple. No JVD present. No tracheal deviation present. No thyromegaly present.  Cardiovascular: Normal rate, regular rhythm, S1 normal, S2 normal, normal heart sounds and intact distal pulses.  Exam reveals no gallop, no distant heart sounds and no friction rub.   No murmur heard. Pulses:      Radial pulses are 2+ on the right side, and 2+ on the left side.       Popliteal pulses are 2+ on the right side, and 2+ on the left side.       Dorsalis pedis pulses are 2+ on the right side.       Posterior tibial pulses are 2+ on the  right side, and 2+ on the left side.  Pulmonary/Chest: Effort normal and breath sounds normal. No stridor. No respiratory distress. He has no wheezes. He has no rales.  Abdominal: Soft. Normal appearance and bowel sounds are normal.  Musculoskeletal:       Right knee: He exhibits normal range of motion (history of meniscal irritatioon per patient previously diagnosed- no new pain or symptoms).       Left knee: Normal.       Right ankle: Normal.       Left ankle: He exhibits swelling (1 plus ankle edema). He exhibits normal range of motion, no ecchymosis, no deformity, no laceration and normal pulse. Tenderness. Medial malleolus tenderness found.  No lateral malleolus, no AITFL, no CF ligament and no posterior TFL tenderness found. Achilles tendon exhibits no pain, no defect and normal Thompson's test results.       Right lower leg: Normal.       Left lower leg: Normal.       Right foot: Normal.       Left foot: There is tenderness (medial dorsal aspect of foot- no bony tenderness ). There is normal range of motion, no bony tenderness, no swelling, normal capillary refill, no crepitus, no deformity and no laceration.       Feet:  Area of tenderness marked on drawing.   Lymphadenopathy:    He has no cervical adenopathy.    He has no axillary adenopathy.  Neurological: He is alert and oriented to person, place, and time. He has normal reflexes. He displays normal reflexes. No cranial nerve deficit. He exhibits abnormal muscle tone. Coordination normal.  Skin: Skin is warm and dry. No rash noted. He is not diaphoretic. No cyanosis or erythema. No pallor. Nails show no clubbing.  Psychiatric: He has a normal mood and affect. His behavior is normal. Judgment and thought content normal.  Vitals reviewed.      Assessment:     Foot pain, left - Plan: Uric acid, CBC with Differential/Platelet, Comprehensive metabolic panel, DG Foot Complete Left, DG Ankle Complete Left  Left ankle pain, unspecified  chronicity     Plan:      Labs ordered to recheck kidney functions as patient has a history of kidney disease. Will check uric acid to rule out Gout and CBC. Tylenol as directed on package for pain. Avoid NSAIDS such as all Ibuprofen, Aleve, Motrin etc due to history of kidney disease.  Apply a compressive ACE bandage. Rest and elevate the affected painful area.  Apply cold compresses intermittently as needed.  As pain recedes, begin normal activities slowly as tolerated.  Call if symptoms persist.     Orders Placed This Encounter  Procedures  . DG Foot Complete Left    Standing Status:   Future    Standing Expiration Date:   06/29/2018    Order Specific Question:   Reason for Exam (SYMPTOM  OR DIAGNOSIS REQUIRED)    Answer:   left foot pain dorsal x 4 weeks/ no known injury pain with walking /swelling    Order Specific Question:   Preferred imaging location?    Answer:   [facility]    Order Specific Question:   Call Results- Best Contact Number?    Answer:   [phone]    Order Specific Question:   Radiology Contrast Protocol - do NOT remove file path    Answer:   \\[email or web address]s.pdf  . DG Ankle Complete Left    Standing Status:   Future    Standing Expiration Date:   06/29/2018    Order Specific Question:   Reason for Exam (SYMPTOM  OR DIAGNOSIS REQUIRED)    Answer:   left ankle swelling/ pain for 4 weeks/ dorsal pain foot / pain with walking    Order Specific Question:   Preferred [facility] location?    [facility]    Order Specific Question:   Radiology Contrast Protocol - do NOT remove file path    Answer:   \\[email or web address]\[email or web address]\DXFluoroContrastProtocols.pdf  . Uric acid  . CBC with Differential/Platelet  . Comprehensive metabolic panel   Will call with x- ray/ lab results if you have not heard within one week call  the office for results.   Follow up with [name], MD for any persistent or  changing symptoms.   Return to clinic at any time  if any new symptoms change, worsen or do not improve. Symptoms should improve  within 72 hours and if not improving you should call for an appointment at the clinic or be seen in urgent care/ED if clinic is closed. 911 for emergency signs or symptoms. Patient verbalized above understanding of all instructions . Patient verbalized understanding of instructions and denies any further questions at this time.

## 2017-04-30 LAB — CBC WITH DIFFERENTIAL/PLATELET
BASOS ABS: 0 10*3/uL (ref 0.0–0.2)
Basos: 0 %
EOS (ABSOLUTE): 0.4 10*3/uL (ref 0.0–0.4)
Eos: 6 %
HEMOGLOBIN: 10 g/dL — AB (ref 13.0–17.7)
Hematocrit: 31.9 % — ABNORMAL LOW (ref 37.5–51.0)
Immature Grans (Abs): 0 10*3/uL (ref 0.0–0.1)
Immature Granulocytes: 0 %
LYMPHS ABS: 1.2 10*3/uL (ref 0.7–3.1)
Lymphs: 17 %
MCH: 30.3 pg (ref 26.6–33.0)
MCHC: 31.3 g/dL — AB (ref 31.5–35.7)
MCV: 97 fL (ref 79–97)
MONOS ABS: 0.5 10*3/uL (ref 0.1–0.9)
Monocytes: 7 %
NEUTROS ABS: 5 10*3/uL (ref 1.4–7.0)
Neutrophils: 70 %
Platelets: 242 10*3/uL (ref 150–379)
RBC: 3.3 x10E6/uL — AB (ref 4.14–5.80)
RDW: 14.1 % (ref 12.3–15.4)
WBC: 7.2 10*3/uL (ref 3.4–10.8)

## 2017-04-30 LAB — COMPREHENSIVE METABOLIC PANEL
ALBUMIN: 4.5 g/dL (ref 3.6–4.8)
ALK PHOS: 67 IU/L (ref 39–117)
ALT: 18 IU/L (ref 0–44)
AST: 23 IU/L (ref 0–40)
Albumin/Globulin Ratio: 1.7 (ref 1.2–2.2)
BUN / CREAT RATIO: 26 — AB (ref 10–24)
BUN: 49 mg/dL — AB (ref 8–27)
CHLORIDE: 102 mmol/L (ref 96–106)
CO2: 20 mmol/L (ref 20–29)
CREATININE: 1.87 mg/dL — AB (ref 0.76–1.27)
Calcium: 9.2 mg/dL (ref 8.6–10.2)
GFR calc Af Amer: 42 mL/min/{1.73_m2} — ABNORMAL LOW (ref >59)
GFR calc non Af Amer: 36 mL/min/{1.73_m2} — ABNORMAL LOW (ref >59)
GLUCOSE: 146 mg/dL — AB (ref 65–99)
Globulin, Total: 2.6 g/dL (ref 1.5–4.5)
Potassium: 5.5 mmol/L — ABNORMAL HIGH (ref 3.5–5.2)
SODIUM: 136 mmol/L (ref 134–144)
Total Protein: 7.1 g/dL (ref 6.0–8.5)

## 2017-04-30 LAB — URIC ACID: URIC ACID: 6.8 mg/dL (ref 3.7–8.6)

## 2017-05-02 ENCOUNTER — Telehealth: Payer: Self-pay

## 2017-05-02 NOTE — Telephone Encounter (Signed)
-----   Message from Doreen Beam, [place] sent at 04/29/2017  3:13 PM EDT ----- Please see my telephone encounter and call patient with results I have reviewed. Continue Apply a compressive ACE bandage. Rest and elevate the affected painful area.  Apply cold compresses intermittently as needed.  As pain recedes, begin normal activities slowly as tolerated.  Call if symptoms persist follow up with Primary care.  He did not answer x2

## 2017-05-02 NOTE — Telephone Encounter (Signed)
Attempted to contact patient to relay results from Laverna Peace, [place]. Pt did not answer. LMOM asking for a return call.  Will try again.

## 2017-05-03 NOTE — Telephone Encounter (Signed)
Pt returned call from yesterday. Relayed information about pt Xray results. Pt states he has already received a call about these results, and requests lab results.

## 2017-05-04 ENCOUNTER — Telehealth: Payer: Self-pay | Admitting: Adult Health

## 2017-05-04 NOTE — Telephone Encounter (Signed)
Called patient and informed of results, pt verbalizes understanding and states that he will call his PCP today. He states that he already has a follow up with his kidney specialist in December, and does not want to schedule a visit with her at this time.

## 2017-05-04 NOTE — Telephone Encounter (Signed)
Spoke with pt regarding lab results. Explained the instruction from Laverna Peace, [place] to call his primary care physician to schedule a follow up and to schedule with his Kidney specialist a week after the PCP visit due to elevated potassium levels and Kidney function results. Pt states he has an appointment with the kidney specialist in December and does not want to make an appointment with her at this time. He states that he will contact his PCP today.

## 2017-05-04 NOTE — Telephone Encounter (Signed)
04/03/17 4:00 Provider called patient to be sure he followed through with making an appointment for this week with his PCP for evaluation. [place] Assistant called patient to advise to schedule appointment on 9/5/am. He currently denies any new symptoms and reports his left foot/ankle is improving some. He denies any worsening symptoms. He  verbalizes understanding to follow with [name], MD for potassium elevation, anemia, kidney function per labs this week  and if any symptoms persist or  pain/ swelling in left ankle foot. He denies any palpitations, chest pain or shortness of breath. Return to clinic at any time  if any new symptoms change, worsen or do not improve. Symptoms should improve  within 72 hours and if not improving you should call for an appointment at the clinic or be seen in urgent care/ED if clinic is closed. Please see [name], MD this week 05/04/17. He reports he will call [name], [name], MD today and follow up with his kidney specialist.  Patient verbalized above understanding of all instructions. Patient verbalized understanding of instructions and denies any further questions at this time.    Discussed labs below :   Recent Results (from the past 2160 hour(s))  PSA     Status: None   Collection Time: 03/01/17  9:20 AM  Result Value Ref Range   Prostate Specific Ag, Serum 2.5 0.0 - 4.0 ng/mL    Comment: Roche ECLIA methodology. According to the American Urological Association, Serum PSA should decrease and remain at undetectable levels after radical prostatectomy. The AUA defines biochemical recurrence as an initial PSA value 0.2 ng/mL or greater followed by a subsequent confirmatory PSA value 0.2 ng/mL or greater. Values obtained with different assay methods or kits cannot be used interchangeably. Results cannot be interpreted as absolute evidence of the presence or absence of malignant disease.   Specimen Status     Status: None  (Preliminary result)   Collection Time: 03/01/17  9:20 AM  Result Value Ref Range   WBC WILL FOLLOW    RBC WILL FOLLOW    Hemoglobin WILL FOLLOW    Hematocrit WILL FOLLOW    MCV WILL FOLLOW    MCH WILL FOLLOW    MCHC WILL FOLLOW    RDW WILL FOLLOW    Platelets WILL FOLLOW    Neutrophils WILL FOLLOW    Lymphs WILL FOLLOW    Monocytes WILL FOLLOW    Eos WILL FOLLOW    Basos WILL FOLLOW    Neutrophils Absolute WILL FOLLOW    Lymphocytes Absolute WILL FOLLOW    Monocytes Absolute WILL FOLLOW    EOS (ABSOLUTE) WILL FOLLOW    Basophils Absolute WILL FOLLOW    Immature Granulocytes WILL FOLLOW    Immature Grans (Abs) WILL FOLLOW   Uric acid     Status: None   Collection Time: 04/29/17  8:29 AM  Result Value Ref Range   Uric Acid 6.8 3.7 - 8.6 mg/dL    Comment:            Therapeutic target for gout patients: <6.0  CBC with Differential/Platelet     Status: Abnormal   Collection Time: 04/29/17  8:29 AM  Result Value Ref Range   WBC 7.2 3.4 - 10.8 x10E3/uL   RBC 3.30 (L) 4.14 - 5.80 x10E6/uL   Hemoglobin 10.0 (L) 13.0 - 17.7 g/dL   Hematocrit 31.9 (L) 37.5 - 51.0 %   MCV 97 79 - 97 fL   MCH  30.3 26.6 - 33.0 pg   MCHC 31.3 (L) 31.5 - 35.7 g/dL   RDW 14.1 12.3 - 15.4 %   Platelets 242 150 - 379 x10E3/uL   Neutrophils 70 Not Estab. %   Lymphs 17 Not Estab. %   Monocytes 7 Not Estab. %   Eos 6 Not Estab. %   Basos 0 Not Estab. %   Neutrophils Absolute 5.0 1.4 - 7.0 x10E3/uL   Lymphocytes Absolute 1.2 0.7 - 3.1 x10E3/uL   Monocytes Absolute 0.5 0.1 - 0.9 x10E3/uL   EOS (ABSOLUTE) 0.4 0.0 - 0.4 x10E3/uL   Basophils Absolute 0.0 0.0 - 0.2 x10E3/uL   Immature Granulocytes 0 Not Estab. %   Immature Grans (Abs) 0.0 0.0 - 0.1 x10E3/uL  Comprehensive metabolic panel     Status: Abnormal   Collection Time: 04/29/17  8:29 AM  Result Value Ref Range   Glucose 146 (H) 65 - 99 mg/dL   BUN 49 (H) 8 - 27 mg/dL   Creatinine, Ser 1.87 (H) 0.76 - 1.27 mg/dL   GFR calc non Af Amer 36 (L)  >59 mL/min/1.73   GFR calc Af Amer 42 (L) >59 mL/min/1.73   BUN/Creatinine Ratio 26 (H) 10 - 24   Sodium 136 134 - 144 mmol/L   Potassium 5.5 (H) 3.5 - 5.2 mmol/L   Chloride 102 96 - 106 mmol/L   CO2 20 20 - 29 mmol/L   Calcium 9.2 8.6 - 10.2 mg/dL   Total Protein 7.1 6.0 - 8.5 g/dL   Albumin 4.5 3.6 - 4.8 g/dL   Globulin, Total 2.6 1.5 - 4.5 g/dL   Albumin/Globulin Ratio 1.7 1.2 - 2.2   Bilirubin Total <0.2 0.0 - 1.2 mg/dL   Alkaline Phosphatase 67 39 - 117 IU/L   AST 23 0 - 40 IU/L   ALT 18 0 - 44 IU/L

## 2017-05-04 NOTE — Progress Notes (Signed)
Pt is aware and verbalizes understanding

## 2017-05-06 ENCOUNTER — Telehealth: Payer: Self-pay

## 2017-05-06 NOTE — Telephone Encounter (Signed)
See telephone note.

## 2017-06-17 ENCOUNTER — Other Ambulatory Visit: Payer: Self-pay

## 2017-06-17 DIAGNOSIS — N183 Chronic kidney disease, stage 3 unspecified: Secondary | ICD-10-CM

## 2017-06-17 DIAGNOSIS — E538 Deficiency of other specified B group vitamins: Secondary | ICD-10-CM

## 2017-06-17 DIAGNOSIS — E785 Hyperlipidemia, unspecified: Secondary | ICD-10-CM

## 2017-06-17 DIAGNOSIS — E1121 Type 2 diabetes mellitus with diabetic nephropathy: Secondary | ICD-10-CM

## 2017-06-17 DIAGNOSIS — D631 Anemia in chronic kidney disease: Secondary | ICD-10-CM

## 2017-06-17 MED ORDER — CYANOCOBALAMIN 1000 MCG/ML IJ SOLN
1000.0000 ug | Freq: Once | INTRAMUSCULAR | Status: AC
  Administered 2017-06-17: 1000 ug via INTRAMUSCULAR

## 2017-06-17 NOTE — Addendum Note (Signed)
Addended by: Maura Crandall on: 06/17/2017 10:23 AM   Modules accepted: Orders

## 2017-06-18 LAB — COMPREHENSIVE METABOLIC PANEL
A/G RATIO: 1.6 (ref 1.2–2.2)
ALBUMIN: 4.1 g/dL (ref 3.6–4.8)
ALK PHOS: 60 IU/L (ref 39–117)
ALT: 17 IU/L (ref 0–44)
AST: 23 IU/L (ref 0–40)
BILIRUBIN TOTAL: 0.3 mg/dL (ref 0.0–1.2)
BUN / CREAT RATIO: 22 (ref 10–24)
BUN: 45 mg/dL — ABNORMAL HIGH (ref 8–27)
CALCIUM: 9.1 mg/dL (ref 8.6–10.2)
CHLORIDE: 105 mmol/L (ref 96–106)
CO2: 20 mmol/L (ref 20–29)
Creatinine, Ser: 2.08 mg/dL — ABNORMAL HIGH (ref 0.76–1.27)
GFR calc non Af Amer: 32 mL/min/{1.73_m2} — ABNORMAL LOW (ref >59)
GFR, EST AFRICAN AMERICAN: 37 mL/min/{1.73_m2} — AB (ref >59)
GLOBULIN, TOTAL: 2.6 g/dL (ref 1.5–4.5)
Glucose: 118 mg/dL — ABNORMAL HIGH (ref 65–99)
POTASSIUM: 4.5 mmol/L (ref 3.5–5.2)
SODIUM: 139 mmol/L (ref 134–144)
TOTAL PROTEIN: 6.7 g/dL (ref 6.0–8.5)

## 2017-06-18 LAB — CBC WITH DIFFERENTIAL/PLATELET
BASOS ABS: 0 10*3/uL (ref 0.0–0.2)
Basos: 0 %
EOS (ABSOLUTE): 0.1 10*3/uL (ref 0.0–0.4)
EOS: 3 %
HEMATOCRIT: 28.2 % — AB (ref 37.5–51.0)
Hemoglobin: 9 g/dL — ABNORMAL LOW (ref 13.0–17.7)
Immature Grans (Abs): 0 10*3/uL (ref 0.0–0.1)
Immature Granulocytes: 0 %
LYMPHS ABS: 1 10*3/uL (ref 0.7–3.1)
LYMPHS: 24 %
MCH: 31.3 pg (ref 26.6–33.0)
MCHC: 31.9 g/dL (ref 31.5–35.7)
MCV: 98 fL — ABNORMAL HIGH (ref 79–97)
MONOCYTES: 12 %
Monocytes Absolute: 0.5 10*3/uL (ref 0.1–0.9)
Neutrophils Absolute: 2.6 10*3/uL (ref 1.4–7.0)
Neutrophils: 61 %
PLATELETS: 207 10*3/uL (ref 150–379)
RBC: 2.88 x10E6/uL — AB (ref 4.14–5.80)
RDW: 14.7 % (ref 12.3–15.4)
WBC: 4.3 10*3/uL (ref 3.4–10.8)

## 2017-06-18 LAB — LIPID PANEL
CHOLESTEROL TOTAL: 133 mg/dL (ref 100–199)
Chol/HDL Ratio: 3.3 ratio (ref 0.0–5.0)
HDL: 40 mg/dL (ref >39)
LDL Calculated: 72 mg/dL (ref 0–99)
Triglycerides: 106 mg/dL (ref 0–149)
VLDL CHOLESTEROL CAL: 21 mg/dL (ref 5–40)

## 2017-06-18 LAB — HGB A1C W/O EAG: HEMOGLOBIN A1C: 6.4 % — AB (ref 4.8–5.6)

## 2017-07-27 ENCOUNTER — Other Ambulatory Visit: Payer: Self-pay

## 2017-07-27 DIAGNOSIS — E538 Deficiency of other specified B group vitamins: Secondary | ICD-10-CM

## 2017-07-27 DIAGNOSIS — Z0000 Encounter for general adult medical examination without abnormal findings: Secondary | ICD-10-CM

## 2017-07-27 MED ORDER — CYANOCOBALAMIN 1000 MCG/ML IJ SOLN
1000.0000 ug | Freq: Once | INTRAMUSCULAR | Status: AC
  Administered 2017-07-27: 1000 ug via INTRAMUSCULAR

## 2017-07-28 LAB — BASIC METABOLIC PANEL
BUN / CREAT RATIO: 23 (ref 10–24)
BUN: 42 mg/dL — AB (ref 8–27)
CO2: 23 mmol/L (ref 20–29)
CREATININE: 1.83 mg/dL — AB (ref 0.76–1.27)
Calcium: 9.2 mg/dL (ref 8.6–10.2)
Chloride: 103 mmol/L (ref 96–106)
GFR calc Af Amer: 43 mL/min/{1.73_m2} — ABNORMAL LOW (ref >59)
GFR, EST NON AFRICAN AMERICAN: 37 mL/min/{1.73_m2} — AB (ref >59)
GLUCOSE: 134 mg/dL — AB (ref 65–99)
Potassium: 4.7 mmol/L (ref 3.5–5.2)
SODIUM: 137 mmol/L (ref 134–144)

## 2017-07-28 LAB — HEMOGLOBIN: Hemoglobin: 9.9 g/dL — ABNORMAL LOW (ref 13.0–17.7)

## 2017-08-10 ENCOUNTER — Inpatient Hospital Stay: Payer: BLUE CROSS/BLUE SHIELD | Attending: Hematology and Oncology | Admitting: Hematology and Oncology

## 2017-08-10 ENCOUNTER — Other Ambulatory Visit: Payer: Self-pay

## 2017-08-10 ENCOUNTER — Inpatient Hospital Stay: Payer: BLUE CROSS/BLUE SHIELD

## 2017-08-10 ENCOUNTER — Encounter: Payer: Self-pay | Admitting: Hematology and Oncology

## 2017-08-10 VITALS — BP 146/63 | HR 66 | Temp 97.1°F | Resp 20 | Ht 71.5 in | Wt 175.7 lb

## 2017-08-10 DIAGNOSIS — Z800 Family history of malignant neoplasm of digestive organs: Secondary | ICD-10-CM | POA: Diagnosis not present

## 2017-08-10 DIAGNOSIS — N183 Chronic kidney disease, stage 3 (moderate): Secondary | ICD-10-CM | POA: Insufficient documentation

## 2017-08-10 DIAGNOSIS — E538 Deficiency of other specified B group vitamins: Secondary | ICD-10-CM | POA: Diagnosis not present

## 2017-08-10 DIAGNOSIS — Z7982 Long term (current) use of aspirin: Secondary | ICD-10-CM | POA: Insufficient documentation

## 2017-08-10 DIAGNOSIS — D649 Anemia, unspecified: Secondary | ICD-10-CM

## 2017-08-10 DIAGNOSIS — Z79899 Other long term (current) drug therapy: Secondary | ICD-10-CM | POA: Diagnosis not present

## 2017-08-10 DIAGNOSIS — R05 Cough: Secondary | ICD-10-CM | POA: Diagnosis not present

## 2017-08-10 DIAGNOSIS — E1122 Type 2 diabetes mellitus with diabetic chronic kidney disease: Secondary | ICD-10-CM | POA: Insufficient documentation

## 2017-08-10 DIAGNOSIS — D631 Anemia in chronic kidney disease: Secondary | ICD-10-CM | POA: Diagnosis not present

## 2017-08-10 DIAGNOSIS — N400 Enlarged prostate without lower urinary tract symptoms: Secondary | ICD-10-CM | POA: Insufficient documentation

## 2017-08-10 DIAGNOSIS — I129 Hypertensive chronic kidney disease with stage 1 through stage 4 chronic kidney disease, or unspecified chronic kidney disease: Secondary | ICD-10-CM

## 2017-08-10 DIAGNOSIS — K219 Gastro-esophageal reflux disease without esophagitis: Secondary | ICD-10-CM

## 2017-08-10 LAB — IRON AND TIBC
IRON: 51 ug/dL (ref 45–182)
Saturation Ratios: 14 % — ABNORMAL LOW (ref 17.9–39.5)
TIBC: 372 ug/dL (ref 250–450)
UIBC: 321 ug/dL

## 2017-08-10 LAB — URINALYSIS, COMPLETE (UACMP) WITH MICROSCOPIC
Bacteria, UA: NONE SEEN
Bilirubin Urine: NEGATIVE
Glucose, UA: 100 mg/dL — AB
Hgb urine dipstick: NEGATIVE
Ketones, ur: NEGATIVE mg/dL
Leukocytes, UA: NEGATIVE
Nitrite: NEGATIVE
Protein, ur: NEGATIVE mg/dL
Specific Gravity, Urine: 1.01 (ref 1.005–1.030)
WBC, UA: NONE SEEN WBC/hpf (ref 0–5)
pH: 5.5 (ref 5.0–8.0)

## 2017-08-10 LAB — COMPREHENSIVE METABOLIC PANEL
ALK PHOS: 61 U/L (ref 38–126)
ALT: 22 U/L (ref 17–63)
ANION GAP: 8 (ref 5–15)
AST: 33 U/L (ref 15–41)
Albumin: 4.4 g/dL (ref 3.5–5.0)
BUN: 59 mg/dL — ABNORMAL HIGH (ref 6–20)
CALCIUM: 9.2 mg/dL (ref 8.9–10.3)
CHLORIDE: 105 mmol/L (ref 101–111)
CO2: 22 mmol/L (ref 22–32)
CREATININE: 1.98 mg/dL — AB (ref 0.61–1.24)
GFR, EST AFRICAN AMERICAN: 38 mL/min — AB (ref >60)
GFR, EST NON AFRICAN AMERICAN: 33 mL/min — AB (ref >60)
Glucose, Bld: 239 mg/dL — ABNORMAL HIGH (ref 65–99)
Potassium: 4.3 mmol/L (ref 3.5–5.1)
SODIUM: 135 mmol/L (ref 135–145)
Total Bilirubin: 0.4 mg/dL (ref 0.3–1.2)
Total Protein: 8.1 g/dL (ref 6.5–8.1)

## 2017-08-10 LAB — FERRITIN: FERRITIN: 116 ng/mL (ref 24–336)

## 2017-08-10 LAB — RETICULOCYTES
RBC.: 3.31 MIL/uL — AB (ref 4.40–5.90)
RETIC CT PCT: 1 % (ref 0.4–3.1)
Retic Count, Absolute: 33.1 10*3/uL (ref 19.0–183.0)

## 2017-08-10 NOTE — Progress Notes (Signed)
Patient referred back to clinic regarding anemia by Dr. Ellison Hughs.  Patient seen by Dr. Mike Gip in 2016 for anemia.  Last colonoscopy in 2016. Patient works 3rd shift.  Dr. Clayborne Artist, Nephrologist discussed with patient about have an iron infusion.  Patient had been donating blood and went to give blood and it was discovered that his iron was too low.

## 2017-08-10 NOTE — Progress Notes (Signed)
[facility] day:  08/10/2017  Chief Complaint: Joshua Cherry is a 66 y.o. male with stage III chronic kidney disease and anemia who is referred in consultation by Dr. Thereasa Distance for assessment and management.  HPI:  The patient was previously seen in the hematology clinic on 02/21/2015.  At that time, he was felt to have anemia of chronic renal disease.  He was diagnosed with B12 deficiency in 2015 and receives  monthy injections at [facility].  He previously was a regular blood donor until approximately 2013-2014 when he was told his "iron was low".  Diet was good.  EGD and colonoscopy noted only reflux in 2015.  Guaiac cards were negative.  He never had a capsule study.  He had chronic kidney disease (creatinine clearance 31-36 ml/min).  Labs on 11/04/2014 revealed a hematocrit of 31.8, hemoglobin 10.3, MCV 95, with a normal WBC and platelet count.  B12 and folate were normal.  SPEP was normal on 07/09/2014.  Labs on 11/18/2014 revealed normal iron studies with a saturation of 18% and a TIBC of 297. Erythropoietin was 10.6.  Antinuclear antibody direct was positive. Sedimentation rate was 3. Haptoglobin was 181. Coombs was negative. Ferritin was 72. ANA noted anti-DNA double-stranded 24 (0-9).  Plan was to initiate an ESA when his hematocrit was < 30 and his hemoglobin < 10.  He was seen by Dr. Smith Mince on 08/02/2017.  He was felt to have stage III chronic kidney disease.   He had an EGD and colonoscopy as part of his evaluation.  He was taking iron daily.  SPEP, UPEP, and FLC were negative.  Anemia was felt related to CKD. Iron studies in 02/2017 revealed that his ferritin was less than 100.  Hgb was 11.0.  As his hemoglobin was 9.9, recommendation was for IV iron.  The following labs were noted  07/09/2014: UA neg, SPEP neg, Cr 1.6 (GFR 44 ml/min), and Hgb 10.2.   09/18/2014 : UPEP neg.   01/15/2015: Hgb 10.9, Cr 1.7 (GFR 41 ml/min). 08/18/2015: Cr 1.6  (GFR 45 ml/min). 10/03/2015: Hgb 9.8, Ferritin 128, %sat 9, Cr 1.6. 06/03/2016: Cr 1.65 (GFR 42 ml/min), TSH nl, Hgb 9.5. 12/10/2016:  Hgb 9.9, Cr 1.8 (GFR 38 ml/min). 03/16/2017 : Ferritin 73, %sat 25, Cr 1.6, FLC neg 07/27/2017:  Hgb 9.9, Cr 1.83 (GFR 37 ml/min).  Symptomatically, he is doing well. He has had a slight cough recently. He denies bleeding; no hematochezia, melena, or gross hematuria. Patient denies restless leg symptoms. He notes a iron rich diet. Patient eats meat on a daily basis. He eats green leafy vegetables 2-3 times per week. Patient denies ice pica. Patient continues on oral iron supplementation daily.  He continues monthly B12 at [facility] (last end of 06/2017).   Past Medical History:  Diagnosis Date  . Anemia   . Chickenpox   . Chronic kidney disease   . Diabetes mellitus without complication ([place])   . GERD (gastroesophageal reflux disease)   . Heart murmur    followed by PCP  . Hypertension   . Prostate enlargement   . Torn meniscus    right    Past Surgical History:  Procedure Laterality Date  . CATARACT EXTRACTION W/PHACO Left 09/08/2016   Procedure: CATARACT EXTRACTION PHACO AND INTRAOCULAR LENS PLACEMENT (IOC);  Surgeon: Leandrew Koyanagi, MD;  Location: [facility];  Service: Ophthalmology;  Laterality: Left;  DIABETIC left  . CATARACT EXTRACTION W/PHACO Right 09/29/2016   Procedure:  CATARACT EXTRACTION PHACO AND INTRAOCULAR LENS PLACEMENT ([place])  right eye;  Surgeon: Leandrew Koyanagi, MD;  Location: [facility];  Service: Ophthalmology;  Laterality: Right;  Diabetic - oral meds Right Eye IVA Topical  . COLONOSCOPY    . COLONOSCOPY    . ESOPHAGOGASTRODUODENOSCOPY    . ESOPHAGOGASTRODUODENOSCOPY      Family History  Problem Relation Age of Onset  . Diabetes Mother   . Hypertension Mother   . Colon cancer Mother   . Hyperlipidemia Mother   . CAD Mother   . Glaucoma Mother   . Heart disease Mother   . Heart attack Mother   .  Stroke Mother   . Diabetes Father   . Alzheimer's disease Father   . Hypertension Father   . Hyperlipidemia Father   . Hip fracture Father   . Diabetes Sister   . Glaucoma Sister   . Cancer Maternal Grandmother     Social History:  reports that  has never smoked. he has never used smokeless tobacco. He reports that he does not drink alcohol or use drugs.  Patient is employed full time by [employer]. The patient is alone today.  Allergies: No Known Allergies  Current Medications: Current Outpatient Medications  Medication Sig Dispense Refill  . aspirin EC 81 MG tablet Take 81 mg by mouth daily.     Marland Kitchen B-Complex-C CAPS Take 1 capsule by mouth daily.     . carvedilol (COREG) 3.125 MG tablet Take 3.125 mg by mouth.    . chlorthalidone (HYGROTON) 25 MG tablet Take 25 mg by mouth daily.    . Cholecalciferol (VITAMIN D3) 2000 UNITS capsule Take 2,000 Units by mouth daily.     . cyanocobalamin (,VITAMIN B-12,) 1000 MCG/ML injection Inject 1,000 mcg into the muscle every 30 (thirty) days.     . ferrous sulfate 325 (65 FE) MG tablet Take 325 mg by mouth daily with breakfast.     . finasteride (PROSCAR) 5 MG tablet Take 5 mg by mouth daily.     Marland Kitchen lisinopril (PRINIVIL,ZESTRIL) 40 MG tablet Take 40 mg by mouth daily.    Marland Kitchen lovastatin (MEVACOR) 20 MG tablet Take 40 mg by mouth daily at 6 PM.     . omeprazole (PRILOSEC) 20 MG capsule Take 20 mg by mouth 2 (two) times daily before a meal.     . ONETOUCH DELICA LANCETS 26S MISC     . pioglitazone (ACTOS) 45 MG tablet Take 45 mg by mouth daily.     . sitaGLIPtin (JANUVIA) 25 MG tablet Take 25 mg by mouth daily.     . tamsulosin (FLOMAX) 0.4 MG CAPS capsule Take 0.4 mg by mouth daily after breakfast.      No current facility-administered medications for this visit.     Review of Systems:  GENERAL:  Feels "ok".  No fevers, sweats or weight loss. PERFORMANCE STATUS (ECOG):  0 HEENT:  s/p cataract surgery.  No visual changes, runny nose, sore  throat, mouth sores or tenderness. Lungs: No shortness of breath.  Little cough.  No hemoptysis. Cardiac:  No chest pain, palpitations, orthopnea, or PND. GI:  No nausea, vomiting, diarrhea, constipation, melena or hematochezia.  No ice pica. GU:  No urgency, frequency, dysuria, or hematuria. Musculoskeletal:  Left foot pain resolved (sprain).  No back pain.  No joint pain.  No muscle tenderness. Extremities:  No pain or swelling. Skin:  No rashes or skin changes. Neuro:  No headache, numbness or weakness, balance  or coordination issues.  No restless legs. Endocrine:  Diabetes.  No thyroid issues, hot flashes or night sweats. Psych:  No mood changes, depression or anxiety. Pain:  No focal pain. Review of systems:  All other systems reviewed and found to be negative.  Physical Exam: Blood pressure (!) 146/63, pulse 66, temperature (!) 97.1 F (36.2 C), temperature source Tympanic, resp. rate 20, height 5' 11.5" (1.816 m), weight 175 lb 11.3 oz (79.7 kg). GENERAL:  Well developed, well nourished, man sitting comfortably in the exam room in no acute distress. MENTAL STATUS:  Alert and oriented to person, place and time. HEAD:  Short gray hair.  Normocephalic, atraumatic, face symmetric, no Cushingoid features. EYES:  Blue eyes.  Pupils equal round and reactive to light and accomodation.  No conjunctivitis or scleral icterus. ENT:  Oropharynx clear without lesion.  Tongue normal. Mucous membranes moist.  RESPIRATORY:  Clear to auscultation without rales, wheezes or rhonchi. CARDIOVASCULAR:  Regular rate and rhythm without murmur, rub or gallop. ABDOMEN:  Soft, non-tender, with active bowel sounds, and no hepatosplenomegaly.  No masses. SKIN:  No rashes, ulcers or lesions. EXTREMITIES: No edema, no skin discoloration or tenderness.  No palpable cords. LYMPH NODES: No palpable cervical, supraclavicular, axillary or inguinal adenopathy  NEUROLOGICAL: Unremarkable. PSYCH:  Appropriate.   No  visits with results within 3 Day(s) from this visit.  Latest known visit with results is:  Lab on 07/27/2017  Component Date Value Ref Range Status  . Hemoglobin 07/27/2017 9.9* 13.0 - 17.7 g/dL Final  . Glucose 07/27/2017 134* 65 - 99 mg/dL Final  . BUN 07/27/2017 42* 8 - 27 mg/dL Final  . Creatinine, Ser 07/27/2017 1.83* 0.76 - 1.27 mg/dL Final  . GFR calc non Af Amer 07/27/2017 37* >59 mL/min/1.73 Final  . GFR calc Af Amer 07/27/2017 43* >59 mL/min/1.73 Final  . BUN/Creatinine Ratio 07/27/2017 23  10 - 24 Final  . Sodium 07/27/2017 137  134 - 144 mmol/L Final  . Potassium 07/27/2017 4.7  3.5 - 5.2 mmol/L Final  . Chloride 07/27/2017 103  96 - 106 mmol/L Final  . CO2 07/27/2017 23  20 - 29 mmol/L Final  . Calcium 07/27/2017 9.2  8.6 - 10.2 mg/dL Final    Assessment:  Joshua Cherry is a 66 y.o. male with stage III chronic kidney disease and anemia.  He has chronic kidney disease (creatinine clearance 37-41 ml/min). Diet is good.  He denies any melena, hematochezia, or hematuria.  He denies any pica or restless legs.  EGD and colonoscopy noted only reflux in 2015.  Guaiac cards were negative.  He never had a capsule study.    He was diagnosed with B12 deficiency in 2015.  He receives  monthy injections at [facility] (last 06/2017).  He was a regular blood donor until approximately 2013-2014 when he was told his "iron was low".   Work-up in 10/2014 revealed a hematocrit of 31.8, hemoglobin 10.3, MCV 95, with a normal WBC and platelet count.  B12 and folate were normal.  SPEP was normal on 07/09/2014.  Iron studies were normal with a saturation of 18% and a TIBC of 297. Ferritin was 72.  Erythropoietin was 10.6.  Antinuclear antibody direct was positive with an anti-DNA double-stranded 24 (0-9).  Sedimentation rate was 3.  Haptoglobin was 181. Coombs was negative.   Symptomatically, he feels good.  Exam is unremarkable.  Plan: 1.  Labs today: CBC with diff, ferritin, retic, iron studies, epo  lvel, SPEP, UPEP,  urinalysis (r/o hematuria). 2.  Discuss iron stores. Patient on supplemental oral iron, however he has not been taking it with vitamin C. Discuss potential need for intravenous replacement. Patient elects to try oral iron with vitamin C before proceeding with IV replacement.  3.  RTC in 1 month for MD assessment and labs (CBC with diff, CMP, ferritin - day before), and +/- Venofer      Honor Loh, NP  08/10/2017, 3:25 PM   I saw and evaluated the patient, participating in the key portions of the service and reviewing pertinent diagnostic studies and records.  I reviewed the nurse practitioner's note and agree with the findings and the plan.  The assessment and plan were discussed with the patient.  Several questions were asked by the patient and answered.   Nolon Stalls, MD 08/10/2017, 3:25 PM

## 2017-08-11 LAB — ERYTHROPOIETIN: ERYTHROPOIETIN: 8.8 m[IU]/mL (ref 2.6–18.5)

## 2017-08-12 LAB — PROTEIN ELECTROPHORESIS, SERUM
A/G RATIO SPE: 1.4 (ref 0.7–1.7)
Albumin ELP: 4 g/dL (ref 2.9–4.4)
Alpha-1-Globulin: 0.2 g/dL (ref 0.0–0.4)
Alpha-2-Globulin: 0.7 g/dL (ref 0.4–1.0)
BETA GLOBULIN: 0.9 g/dL (ref 0.7–1.3)
GAMMA GLOBULIN: 1.1 g/dL (ref 0.4–1.8)
Globulin, Total: 2.9 g/dL (ref 2.2–3.9)
Total Protein ELP: 6.9 g/dL (ref 6.0–8.5)

## 2017-08-14 ENCOUNTER — Encounter: Payer: Self-pay | Admitting: Hematology and Oncology

## 2017-08-15 DIAGNOSIS — I129 Hypertensive chronic kidney disease with stage 1 through stage 4 chronic kidney disease, or unspecified chronic kidney disease: Secondary | ICD-10-CM | POA: Diagnosis not present

## 2017-08-16 ENCOUNTER — Other Ambulatory Visit: Payer: Self-pay

## 2017-08-16 DIAGNOSIS — D649 Anemia, unspecified: Secondary | ICD-10-CM

## 2017-08-18 LAB — UIFE/LIGHT CHAINS/TP QN, 24-HR UR
% BETA, Urine: 0 %
ALPHA 1 URINE: 0 %
Albumin, U: 100 %
Alpha 2, Urine: 0 %
FREE LAMBDA LT CHAINS, UR: 5.38 mg/L (ref 0.24–6.66)
Free Kappa/Lambda Ratio: 35.13 — ABNORMAL HIGH (ref 2.04–10.37)
Free Lt Chn Excr Rate: 189 mg/L — ABNORMAL HIGH (ref 1.35–24.19)
GAMMA GLOBULIN URINE: 0 %
TOTAL PROTEIN, URINE-UR/DAY: 91 mg/(24.h) (ref 30–150)
TOTAL VOLUME: 1150
Total Protein, Urine: 7.9 mg/dL

## 2017-09-09 ENCOUNTER — Other Ambulatory Visit: Payer: Self-pay | Admitting: Urgent Care

## 2017-09-09 ENCOUNTER — Inpatient Hospital Stay: Payer: BLUE CROSS/BLUE SHIELD | Attending: Hematology and Oncology

## 2017-09-09 DIAGNOSIS — K219 Gastro-esophageal reflux disease without esophagitis: Secondary | ICD-10-CM | POA: Diagnosis not present

## 2017-09-09 DIAGNOSIS — N400 Enlarged prostate without lower urinary tract symptoms: Secondary | ICD-10-CM | POA: Insufficient documentation

## 2017-09-09 DIAGNOSIS — Z79899 Other long term (current) drug therapy: Secondary | ICD-10-CM | POA: Diagnosis not present

## 2017-09-09 DIAGNOSIS — D631 Anemia in chronic kidney disease: Secondary | ICD-10-CM | POA: Diagnosis not present

## 2017-09-09 DIAGNOSIS — R600 Localized edema: Secondary | ICD-10-CM | POA: Insufficient documentation

## 2017-09-09 DIAGNOSIS — I129 Hypertensive chronic kidney disease with stage 1 through stage 4 chronic kidney disease, or unspecified chronic kidney disease: Secondary | ICD-10-CM | POA: Insufficient documentation

## 2017-09-09 DIAGNOSIS — D509 Iron deficiency anemia, unspecified: Secondary | ICD-10-CM | POA: Insufficient documentation

## 2017-09-09 DIAGNOSIS — Z7982 Long term (current) use of aspirin: Secondary | ICD-10-CM | POA: Diagnosis not present

## 2017-09-09 DIAGNOSIS — E538 Deficiency of other specified B group vitamins: Secondary | ICD-10-CM | POA: Diagnosis not present

## 2017-09-09 DIAGNOSIS — N183 Chronic kidney disease, stage 3 (moderate): Secondary | ICD-10-CM | POA: Insufficient documentation

## 2017-09-09 DIAGNOSIS — E1122 Type 2 diabetes mellitus with diabetic chronic kidney disease: Secondary | ICD-10-CM | POA: Insufficient documentation

## 2017-09-09 DIAGNOSIS — D649 Anemia, unspecified: Secondary | ICD-10-CM

## 2017-09-09 LAB — COMPREHENSIVE METABOLIC PANEL
ALT: 16 U/L — ABNORMAL LOW (ref 17–63)
AST: 21 U/L (ref 15–41)
Albumin: 4.1 g/dL (ref 3.5–5.0)
Alkaline Phosphatase: 55 U/L (ref 38–126)
Anion gap: 7 (ref 5–15)
BUN: 39 mg/dL — ABNORMAL HIGH (ref 6–20)
CO2: 23 mmol/L (ref 22–32)
Calcium: 8.9 mg/dL (ref 8.9–10.3)
Chloride: 107 mmol/L (ref 101–111)
Creatinine, Ser: 1.47 mg/dL — ABNORMAL HIGH (ref 0.61–1.24)
GFR calc Af Amer: 55 mL/min — ABNORMAL LOW (ref >60)
GFR calc non Af Amer: 47 mL/min — ABNORMAL LOW (ref >60)
Glucose, Bld: 105 mg/dL — ABNORMAL HIGH (ref 65–99)
Potassium: 4.2 mmol/L (ref 3.5–5.1)
Sodium: 137 mmol/L (ref 135–145)
Total Bilirubin: 0.4 mg/dL (ref 0.3–1.2)
Total Protein: 7.1 g/dL (ref 6.5–8.1)

## 2017-09-09 LAB — CBC WITH DIFFERENTIAL/PLATELET
Basophils Absolute: 0 10*3/uL (ref 0–0.1)
Basophils Relative: 1 %
Eosinophils Absolute: 0.3 10*3/uL (ref 0–0.7)
Eosinophils Relative: 4 %
HCT: 30.6 % — ABNORMAL LOW (ref 40.0–52.0)
Hemoglobin: 10.2 g/dL — ABNORMAL LOW (ref 13.0–18.0)
Lymphocytes Relative: 13 %
Lymphs Abs: 1.1 10*3/uL (ref 1.0–3.6)
MCH: 32.1 pg (ref 26.0–34.0)
MCHC: 33.3 g/dL (ref 32.0–36.0)
MCV: 96.6 fL (ref 80.0–100.0)
Monocytes Absolute: 0.9 10*3/uL (ref 0.2–1.0)
Monocytes Relative: 10 %
Neutro Abs: 6.4 10*3/uL (ref 1.4–6.5)
Neutrophils Relative %: 72 %
Platelets: 217 10*3/uL (ref 150–440)
RBC: 3.17 MIL/uL — ABNORMAL LOW (ref 4.40–5.90)
RDW: 13.4 % (ref 11.5–14.5)
WBC: 8.7 10*3/uL (ref 3.8–10.6)

## 2017-09-09 LAB — FERRITIN: Ferritin: 74 ng/mL (ref 24–336)

## 2017-09-12 ENCOUNTER — Inpatient Hospital Stay: Payer: BLUE CROSS/BLUE SHIELD

## 2017-09-12 ENCOUNTER — Encounter: Payer: Self-pay | Admitting: Hematology and Oncology

## 2017-09-12 ENCOUNTER — Other Ambulatory Visit: Payer: Self-pay

## 2017-09-12 ENCOUNTER — Inpatient Hospital Stay: Payer: BLUE CROSS/BLUE SHIELD | Admitting: Hematology and Oncology

## 2017-09-12 VITALS — BP 178/75 | HR 65 | Temp 96.4°F | Wt 187.5 lb

## 2017-09-12 DIAGNOSIS — Z79899 Other long term (current) drug therapy: Secondary | ICD-10-CM

## 2017-09-12 DIAGNOSIS — K219 Gastro-esophageal reflux disease without esophagitis: Secondary | ICD-10-CM

## 2017-09-12 DIAGNOSIS — N183 Chronic kidney disease, stage 3 unspecified: Secondary | ICD-10-CM

## 2017-09-12 DIAGNOSIS — E1122 Type 2 diabetes mellitus with diabetic chronic kidney disease: Secondary | ICD-10-CM

## 2017-09-12 DIAGNOSIS — D509 Iron deficiency anemia, unspecified: Secondary | ICD-10-CM | POA: Diagnosis not present

## 2017-09-12 DIAGNOSIS — N400 Enlarged prostate without lower urinary tract symptoms: Secondary | ICD-10-CM

## 2017-09-12 DIAGNOSIS — E538 Deficiency of other specified B group vitamins: Secondary | ICD-10-CM | POA: Diagnosis not present

## 2017-09-12 DIAGNOSIS — Z7982 Long term (current) use of aspirin: Secondary | ICD-10-CM | POA: Diagnosis not present

## 2017-09-12 DIAGNOSIS — R600 Localized edema: Secondary | ICD-10-CM | POA: Diagnosis not present

## 2017-09-12 DIAGNOSIS — D631 Anemia in chronic kidney disease: Secondary | ICD-10-CM

## 2017-09-12 DIAGNOSIS — I129 Hypertensive chronic kidney disease with stage 1 through stage 4 chronic kidney disease, or unspecified chronic kidney disease: Secondary | ICD-10-CM | POA: Diagnosis not present

## 2017-09-12 NOTE — Progress Notes (Signed)
[facility] day:  09/12/2017  Chief Complaint: Joshua Cherry is a 67 y.o. male with stage III chronic kidney disease and anemia who is seen for 1 month assessment.  HPI:  The patient last seen in the hematology clinic on 08/10/2017.  At that time, he felt good.  Exam was unremarkable.  Labs on 08/10/2017 revealed a ferritin of 116.  Iron saturation was 14% with a TIBC of 372.  BUN was 59 with a Cr 1.98 (CrCl 33-38 ml/min).  Epo level was 8.8.  SPEP revealed no monoclonal protein.  Retic was 1%.  Urinalysis revealed no hematuria.  Labs on 09/09/2017 revealed a hematocrit of 30.6, hemoglobin 10.2, and MCV 96.6.  Ferritin was 74.  Creatinine was 1.47 (CrCl 47-55 ml/min).  During the interim, he has done well.  He denies any acute physical concerns. He is taking oral iron with vitamin C once daily.  Patient denies bleeding; no hematochezia, melena, or gross hematuria. He is eating well.  His weight is up 12 pounds.    Past Medical History:  Diagnosis Date  . Anemia   . Chickenpox   . Chronic kidney disease   . Diabetes mellitus without complication ([place])   . GERD (gastroesophageal reflux disease)   . Heart murmur    followed by PCP  . Hypertension   . Prostate enlargement   . Torn meniscus    right    Past Surgical History:  Procedure Laterality Date  . CATARACT EXTRACTION W/PHACO Left 09/08/2016   Procedure: CATARACT EXTRACTION PHACO AND INTRAOCULAR LENS PLACEMENT (IOC);  Surgeon: Leandrew Koyanagi, MD;  Location: [facility];  Service: Ophthalmology;  Laterality: Left;  DIABETIC left  . CATARACT EXTRACTION W/PHACO Right 09/29/2016   Procedure: CATARACT EXTRACTION PHACO AND INTRAOCULAR LENS PLACEMENT ([place])  right eye;  Surgeon: Leandrew Koyanagi, MD;  Location: [facility];  Service: Ophthalmology;  Laterality: Right;  Diabetic - oral meds Right Eye IVA Topical  . COLONOSCOPY    . COLONOSCOPY    .  ESOPHAGOGASTRODUODENOSCOPY    . ESOPHAGOGASTRODUODENOSCOPY      Family History  Problem Relation Age of Onset  . Diabetes Mother   . Hypertension Mother   . Colon cancer Mother   . Hyperlipidemia Mother   . CAD Mother   . Glaucoma Mother   . Heart disease Mother   . Heart attack Mother   . Stroke Mother   . Diabetes Father   . Alzheimer's disease Father   . Hypertension Father   . Hyperlipidemia Father   . Hip fracture Father   . Diabetes Sister   . Glaucoma Sister   . Cancer Maternal Grandmother     Social History:  reports that  has never smoked. he has never used smokeless tobacco. He reports that he does not drink alcohol or use drugs.  Patient is employed full time by [employer]. The patient is alone today.  Allergies: No Known Allergies  Current Medications: Current Outpatient Medications  Medication Sig Dispense Refill  . aspirin EC 81 MG tablet Take 81 mg by mouth daily.     . B Complex-C (B COMPLEX-VITAMIN C) CAPS Take by mouth.    . carvedilol (COREG) 3.125 MG tablet Take 3.125 mg by mouth.    . chlorthalidone (HYGROTON) 25 MG tablet Take 25 mg by mouth daily.    . Cholecalciferol (VITAMIN D3) 2000 UNITS capsule Take 2,000 Units by mouth daily.     Marland Kitchen  cyanocobalamin (,VITAMIN B-12,) 1000 MCG/ML injection Inject 1,000 mcg into the muscle every 30 (thirty) days.     . ferrous sulfate 325 (65 FE) MG tablet Take 325 mg by mouth daily with breakfast.     . finasteride (PROSCAR) 5 MG tablet Take 5 mg by mouth daily.     Marland Kitchen lisinopril (PRINIVIL,ZESTRIL) 40 MG tablet Take 40 mg by mouth daily.    Marland Kitchen lovastatin (MEVACOR) 20 MG tablet Take 40 mg by mouth daily at 6 PM.     . omeprazole (PRILOSEC) 20 MG capsule Take 20 mg by mouth 2 (two) times daily before a meal.     . ONETOUCH DELICA LANCETS 91Y MISC     . pioglitazone (ACTOS) 45 MG tablet Take 45 mg by mouth daily.     . sitaGLIPtin (JANUVIA) 25 MG tablet Take 25 mg by mouth daily.     . tamsulosin (FLOMAX) 0.4 MG  CAPS capsule Take 0.4 mg by mouth daily after breakfast.      No current facility-administered medications for this visit.     Review of Systems:  GENERAL:  Feels "well".  No fevers or sweats. Weight up 12 pounds.  PERFORMANCE STATUS (ECOG):  0 HEENT:  s/p cataract surgery.  No visual changes, runny nose, sore throat, mouth sores or tenderness. Lungs: No shortness of breath.  Little cough.  No hemoptysis. Cardiac:  No chest pain, palpitations, orthopnea, or PND. GI:  No nausea, vomiting, diarrhea, constipation, melena or hematochezia.  No ice pica. GU:  No urgency, frequency, dysuria, or hematuria. Musculoskeletal:  Left foot pain resolved (sprain).  No back pain.  No joint pain.  No muscle tenderness. Extremities:  No pain or swelling. Skin:  No rashes or skin changes. Neuro:  No headache, numbness or weakness, balance or coordination issues.  No restless legs. Endocrine:  Diabetes.  No thyroid issues, hot flashes or night sweats. Psych:  No mood changes, depression or anxiety. Pain:  No focal pain. Review of systems:  All other systems reviewed and found to be negative.  Physical Exam: Blood pressure (!) 178/75, pulse 65, temperature (!) 96.4 F (35.8 C), temperature source Tympanic, weight 187 lb 8 oz (85 kg). GENERAL:  Well developed, well nourished, gentleman sitting comfortably in the exam room in no acute distress. MENTAL STATUS:  Alert and oriented to person, place and time. HEAD:  Short gray hair.  Normocephalic, atraumatic, face symmetric, no Cushingoid features. EYES:  Blue eyes.  Pupils equal round and reactive to light and accomodation.  No conjunctivitis or scleral icterus. ENT:  Oropharynx clear without lesion.  Tongue normal. Mucous membranes moist.  RESPIRATORY:  Clear to auscultation without rales, wheezes or rhonchi. CARDIOVASCULAR:  Regular rate and rhythm without murmur, rub or gallop. ABDOMEN:  Soft, non-tender, with active bowel sounds, and no  hepatosplenomegaly.  No masses. SKIN:  No rashes, ulcers or lesions. EXTREMITIES:   1+ lower extremity edema.  No skin discoloration or tenderness.  No palpable cords. LYMPH NODES: No palpable cervical, supraclavicular, axillary or inguinal adenopathy  NEUROLOGICAL: Unremarkable. PSYCH:  Appropriate.   No visits with results within 3 Day(s) from this visit.  Latest known visit with results is:  Appointment on 09/09/2017  Component Date Value Ref Range Status  . Ferritin 09/09/2017 74  24 - 336 ng/mL Final   Performed at [facility], [address] [place], [place] [address]  . Sodium 09/09/2017 137  135 - 145 mmol/L Final  . Potassium 09/09/2017 4.2  3.5 - 5.1 mmol/L Final  . Chloride 09/09/2017 107  101 - 111 mmol/L Final  . CO2 09/09/2017 23  22 - 32 mmol/L Final  . Glucose, Bld 09/09/2017 105* 65 - 99 mg/dL Final  . BUN 09/09/2017 39* 6 - 20 mg/dL Final  . Creatinine, Ser 09/09/2017 1.47* 0.61 - 1.24 mg/dL Final  . Calcium 09/09/2017 8.9  8.9 - 10.3 mg/dL Final  . Total Protein 09/09/2017 7.1  6.5 - 8.1 g/dL Final  . Albumin 09/09/2017 4.1  3.5 - 5.0 g/dL Final  . AST 09/09/2017 21  15 - 41 U/L Final  . ALT 09/09/2017 16* 17 - 63 U/L Final  . Alkaline Phosphatase 09/09/2017 55  38 - 126 U/L Final  . Total Bilirubin 09/09/2017 0.4  0.3 - 1.2 mg/dL Final  . GFR calc non Af Amer 09/09/2017 47* >60 mL/min Final  . GFR calc Af Amer 09/09/2017 55* >60 mL/min Final   Comment: (NOTE) The eGFR has been calculated using the CKD EPI equation. This calculation has not been validated in all clinical situations. eGFR's persistently <60 mL/min signify possible Chronic Kidney Disease.   Georgiann Hahn gap 09/09/2017 7  5 - 15 Final   Performed at [facility], [address] [place], [place] [address]  . WBC 09/09/2017 8.7  3.8 - 10.6 K/uL Final  . RBC 09/09/2017 3.17* 4.40 - 5.90 MIL/uL Final  . Hemoglobin 09/09/2017 10.2* 13.0 - 18.0 g/dL Final  . HCT 09/09/2017 30.6* 40.0  - 52.0 % Final  . MCV 09/09/2017 96.6  80.0 - 100.0 fL Final  . MCH 09/09/2017 32.1  26.0 - 34.0 pg Final  . MCHC 09/09/2017 33.3  32.0 - 36.0 g/dL Final  . RDW 09/09/2017 13.4  11.5 - 14.5 % Final  . Platelets 09/09/2017 217  150 - 440 K/uL Final  . Neutrophils Relative % 09/09/2017 72  % Final  . Neutro Abs 09/09/2017 6.4  1.4 - 6.5 K/uL Final  . Lymphocytes Relative 09/09/2017 13  % Final  . Lymphs Abs 09/09/2017 1.1  1.0 - 3.6 K/uL Final  . Monocytes Relative 09/09/2017 10  % Final  . Monocytes Absolute 09/09/2017 0.9  0.2 - 1.0 K/uL Final  . Eosinophils Relative 09/09/2017 4  % Final  . Eosinophils Absolute 09/09/2017 0.3  0 - 0.7 K/uL Final  . Basophils Relative 09/09/2017 1  % Final  . Basophils Absolute 09/09/2017 0.0  0 - 0.1 K/uL Final   Performed at [facility], [address] [place], [place] [address]    Assessment:  Joshua Cherry is a 67 y.o. male with stage III chronic kidney disease and anemia.  He has chronic kidney disease (creatinine clearance 37-41 ml/min). Diet is good.  He denies any melena, hematochezia, or hematuria.  He denies any pica or restless legs.  EGD and colonoscopy noted only reflux in 2015.  Guaiac cards were negative.  He never had a capsule study.    He was diagnosed with B12 deficiency in 2015.  He receives  monthy injections at [facility] (last 06/2017).  He was a regular blood donor until approximately 2013-2014 when he was told his "iron was low".   Work-up in 10/2014 revealed a hematocrit of 31.8, hemoglobin 10.3, MCV 95, with a normal WBC and platelet count.  B12 and folate were normal.  SPEP was normal on 07/09/2014.  Iron studies were normal with a saturation of 18% and a TIBC of 297. Ferritin was 72.  Erythropoietin  was 10.6.  Antinuclear antibody direct was positive with an anti-DNA double-stranded 24 (0-9).  Sedimentation rate was 3.  Haptoglobin was 181. Coombs was negative.   Work-up on 08/10/2017 revealed a ferritin of 116.  Iron  saturation was 14% with a TIBC of 372.  BUN was 59 with a Cr 1.98 (CrCl 33-38 ml/min).  Epo level was 8.8.  SPEP revealed no monoclonal protein.  Retic was 1%.  Urinalysis revealed no hematuria.  Symptomatically, he feels good.  Exam reveals 1+ lower extremity edema.  Hemoglobin has improved from 9.9 to 10.2. Ferritin is 74.  Plan: 1.  Labs today: CBC with diff, CMP, ferritin 2.  Discuss iron stores. Ferritin 74. Continue oral iron with vitamin C once daily.  No Venofer today. 3.  Discuss blood pressure. Reading elevated to 178/75 today. Denies missing antihypertensive doses. Patient encouraged to monitor at home and follow up with Dr. Smith Mince.  4.  Discuss improved renal function. BUN 39 with a creatinine of 1.47 (CrCl 52 mL/min). 5.  RTC in 2 month for MD assessment and labs (CBC with diff, CMP, ferritin - day before), and +/- Venofer      Honor Loh, NP  09/12/2017, 11:03 AM   I saw and evaluated the patient, participating in the key portions of the service and reviewing pertinent diagnostic studies and records.  I reviewed the nurse practitioner's note and agree with the findings and the plan.  The assessment and plan were discussed with the patient.  Several questions were asked by the patient and answered.   Nolon Stalls, MD 09/12/2017, 11:03 AM

## 2017-09-18 ENCOUNTER — Encounter: Payer: Self-pay | Admitting: Hematology and Oncology

## 2017-11-04 ENCOUNTER — Telehealth: Payer: Self-pay | Admitting: Hematology and Oncology

## 2017-11-04 NOTE — Telephone Encounter (Signed)
  Let's set up.  M

## 2017-11-07 ENCOUNTER — Telehealth: Payer: Self-pay | Admitting: Hematology and Oncology

## 2017-11-07 NOTE — Telephone Encounter (Signed)
We saw that. He will have to come pick up a form. That is the only way we can do it.

## 2017-11-10 ENCOUNTER — Inpatient Hospital Stay: Payer: BLUE CROSS/BLUE SHIELD

## 2017-11-10 ENCOUNTER — Other Ambulatory Visit: Payer: Self-pay

## 2017-11-10 DIAGNOSIS — D509 Iron deficiency anemia, unspecified: Secondary | ICD-10-CM

## 2017-11-11 ENCOUNTER — Other Ambulatory Visit: Payer: Self-pay

## 2017-11-11 DIAGNOSIS — N289 Disorder of kidney and ureter, unspecified: Secondary | ICD-10-CM

## 2017-11-11 DIAGNOSIS — N183 Chronic kidney disease, stage 3 unspecified: Secondary | ICD-10-CM

## 2017-11-11 DIAGNOSIS — D509 Iron deficiency anemia, unspecified: Secondary | ICD-10-CM

## 2017-11-11 DIAGNOSIS — E1122 Type 2 diabetes mellitus with diabetic chronic kidney disease: Secondary | ICD-10-CM

## 2017-11-11 DIAGNOSIS — E785 Hyperlipidemia, unspecified: Secondary | ICD-10-CM

## 2017-11-11 MED ORDER — CYANOCOBALAMIN 1000 MCG/ML IJ SOLN
1000.0000 ug | Freq: Once | INTRAMUSCULAR | Status: AC
  Administered 2017-11-11: 1000 ug via INTRAMUSCULAR

## 2017-11-14 ENCOUNTER — Ambulatory Visit: Payer: BLUE CROSS/BLUE SHIELD | Admitting: Hematology and Oncology

## 2017-11-14 ENCOUNTER — Ambulatory Visit: Payer: BLUE CROSS/BLUE SHIELD

## 2017-11-17 ENCOUNTER — Encounter: Payer: Self-pay | Admitting: Hematology and Oncology

## 2017-11-17 ENCOUNTER — Inpatient Hospital Stay: Payer: BLUE CROSS/BLUE SHIELD

## 2017-11-17 ENCOUNTER — Inpatient Hospital Stay: Payer: BLUE CROSS/BLUE SHIELD | Attending: Hematology and Oncology | Admitting: Hematology and Oncology

## 2017-11-17 VITALS — BP 164/73 | HR 62 | Temp 98.4°F | Resp 16 | Wt 188.0 lb

## 2017-11-17 DIAGNOSIS — N183 Chronic kidney disease, stage 3 (moderate): Secondary | ICD-10-CM | POA: Diagnosis not present

## 2017-11-17 DIAGNOSIS — E783 Hyperchylomicronemia: Secondary | ICD-10-CM

## 2017-11-17 DIAGNOSIS — Z9889 Other specified postprocedural states: Secondary | ICD-10-CM | POA: Insufficient documentation

## 2017-11-17 DIAGNOSIS — Z79899 Other long term (current) drug therapy: Secondary | ICD-10-CM | POA: Diagnosis not present

## 2017-11-17 DIAGNOSIS — Z8249 Family history of ischemic heart disease and other diseases of the circulatory system: Secondary | ICD-10-CM | POA: Insufficient documentation

## 2017-11-17 DIAGNOSIS — I129 Hypertensive chronic kidney disease with stage 1 through stage 4 chronic kidney disease, or unspecified chronic kidney disease: Secondary | ICD-10-CM | POA: Diagnosis present

## 2017-11-17 DIAGNOSIS — K219 Gastro-esophageal reflux disease without esophagitis: Secondary | ICD-10-CM | POA: Diagnosis not present

## 2017-11-17 DIAGNOSIS — E1122 Type 2 diabetes mellitus with diabetic chronic kidney disease: Secondary | ICD-10-CM

## 2017-11-17 DIAGNOSIS — D631 Anemia in chronic kidney disease: Secondary | ICD-10-CM | POA: Insufficient documentation

## 2017-11-17 DIAGNOSIS — Z808 Family history of malignant neoplasm of other organs or systems: Secondary | ICD-10-CM | POA: Diagnosis not present

## 2017-11-17 DIAGNOSIS — Z7982 Long term (current) use of aspirin: Secondary | ICD-10-CM | POA: Diagnosis not present

## 2017-11-17 DIAGNOSIS — Z833 Family history of diabetes mellitus: Secondary | ICD-10-CM | POA: Diagnosis not present

## 2017-11-17 DIAGNOSIS — E538 Deficiency of other specified B group vitamins: Secondary | ICD-10-CM | POA: Diagnosis not present

## 2017-11-17 NOTE — Progress Notes (Signed)
[facility] day:  11/17/2017  Chief Complaint: Joshua Cherry is a 67 y.o. male with stage III chronic kidney disease and anemia who is seen for 2 month assessment.  HPI:  The patient last seen in the hematology clinic on 09/12/2017.  At that time, he felt good.  Exam revealed 1+ lower extremity edema.  Hemoglobin had improved from 9.9 to 10.2. Ferritin was 74.  During the interim, patient is feeling well. He denies any acute concerns. Patient has continued lower extremity edema, however he notes that this has improved. Patient is on B12 injections at [facility] (last was on 11/11/2017).   Patient is eating well. His weight is up 1 pound. He denies pain in the clinic today.   [facility] labs on 11/11/2017 revealed a hematocrit of 31.7, hemoglobin 10.4, MCV 96, platelets 214,000, WBC 5100 with an ANC of 3300.  BUN is 44 and creatinine 1.88.  LFTs were normal.  Ferritin was 151.  Iron saturation was 24% with a TIBC of 243.   Past Medical History:  Diagnosis Date  . Anemia   . Chickenpox   . Chronic kidney disease   . Diabetes mellitus without complication ([place])   . GERD (gastroesophageal reflux disease)   . Heart murmur    followed by PCP  . Hypertension   . Prostate enlargement   . Torn meniscus    right    Past Surgical History:  Procedure Laterality Date  . CATARACT EXTRACTION W/PHACO Left 09/08/2016   Procedure: CATARACT EXTRACTION PHACO AND INTRAOCULAR LENS PLACEMENT (IOC);  Surgeon: Leandrew Koyanagi, MD;  Location: [facility];  Service: Ophthalmology;  Laterality: Left;  DIABETIC left  . CATARACT EXTRACTION W/PHACO Right 09/29/2016   Procedure: CATARACT EXTRACTION PHACO AND INTRAOCULAR LENS PLACEMENT ([place])  right eye;  Surgeon: Leandrew Koyanagi, MD;  Location: [facility];  Service: Ophthalmology;  Laterality: Right;  Diabetic - oral meds Right Eye IVA Topical  . COLONOSCOPY    . COLONOSCOPY    .  ESOPHAGOGASTRODUODENOSCOPY    . ESOPHAGOGASTRODUODENOSCOPY      Family History  Problem Relation Age of Onset  . Diabetes Mother   . Hypertension Mother   . Colon cancer Mother   . Hyperlipidemia Mother   . CAD Mother   . Glaucoma Mother   . Heart disease Mother   . Heart attack Mother   . Stroke Mother   . Diabetes Father   . Alzheimer's disease Father   . Hypertension Father   . Hyperlipidemia Father   . Hip fracture Father   . Diabetes Sister   . Glaucoma Sister   . Cancer Maternal Grandmother     Social History:  reports that he has never smoked. He has never used smokeless tobacco. He reports that he does not drink alcohol or use drugs.  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  The patient is alone today.  Allergies: No Known Allergies  Current Medications: Current Outpatient Medications  Medication Sig Dispense Refill  . aspirin EC 81 MG tablet Take 81 mg by mouth daily.     . B Complex-C (B COMPLEX-VITAMIN C) CAPS Take by mouth.    . carvedilol (COREG) 3.125 MG tablet Take 3.125 mg by mouth.    . chlorthalidone (HYGROTON) 25 MG tablet Take 25 mg by mouth daily.    . Cholecalciferol (VITAMIN D3) 2000 UNITS capsule Take 2,000 Units by mouth  daily.     . cyanocobalamin (,VITAMIN B-12,) 1000 MCG/ML injection Inject 1,000 mcg into the muscle every 30 (thirty) days.     . ferrous sulfate 325 (65 FE) MG tablet Take 325 mg by mouth daily with breakfast.     . finasteride (PROSCAR) 5 MG tablet Take 5 mg by mouth daily.     Marland Kitchen lisinopril (PRINIVIL,ZESTRIL) 40 MG tablet Take 40 mg by mouth daily.    Marland Kitchen lovastatin (MEVACOR) 20 MG tablet Take 40 mg by mouth daily at 6 PM.     . omeprazole (PRILOSEC) 20 MG capsule Take 20 mg by mouth 2 (two) times daily before a meal.     . ONETOUCH DELICA LANCETS 95A MISC     . pioglitazone (ACTOS) 45 MG tablet Take 45 mg by mouth daily.     . sitaGLIPtin (JANUVIA) 25 MG tablet Take 25 mg by mouth  daily.     . tamsulosin (FLOMAX) 0.4 MG CAPS capsule Take 0.4 mg by mouth daily after breakfast.      No current facility-administered medications for this visit.     Review of Systems:  GENERAL:  Feels "well".  No fevers or sweats. Weight up 1 pounds.  PERFORMANCE STATUS (ECOG):  0 HEENT:  s/p cataract surgery.  No visual changes, runny nose, sore throat, mouth sores or tenderness. Lungs: No shortness of breath.  Little cough.  No hemoptysis. Cardiac:  No chest pain, palpitations, orthopnea, or PND. GI:  No nausea, vomiting, diarrhea, constipation, melena or hematochezia.  No ice pica. GU:  No urgency, frequency, dysuria, or hematuria. Musculoskeletal:  Left foot pain resolved (sprain).  No back pain.  No joint pain.  No muscle tenderness. Extremities:  Chronic lower extremity edema, improved. No pain. Skin:  No rashes or skin changes. Neuro:  No headache, numbness or weakness, balance or coordination issues.  No restless legs. Endocrine:  Diabetes.  No thyroid issues, hot flashes or night sweats. Psych:  No mood changes, depression or anxiety. Pain:  No focal pain. Review of systems:  All other systems reviewed and found to be negative.  Physical Exam: Blood pressure (!) 164/73, pulse 62, temperature 98.4 F (36.9 C), temperature source Tympanic, resp. rate 16, weight 188 lb (85.3 kg). GENERAL:  Well developed, well nourished, gentleman sitting comfortably in the exam room in no acute distress. MENTAL STATUS:  Alert and oriented to person, place and time. HEAD:  Short gray hair.  Normocephalic, atraumatic, face symmetric, no Cushingoid features. EYES:  Blue eyes.  Pupils equal round and reactive to light and accomodation.  No conjunctivitis or scleral icterus. ENT:  Oropharynx clear without lesion.  Tongue normal. Mucous membranes moist.  RESPIRATORY:  Clear to auscultation without rales, wheezes or rhonchi. CARDIOVASCULAR:  Regular rate and rhythm without murmur, rub or  gallop. ABDOMEN:  Soft, non-tender, with active bowel sounds, and no hepatosplenomegaly.  No masses. SKIN:  No rashes, ulcers or lesions. EXTREMITIES:   1+ lower extremity edema.  No skin discoloration or tenderness.  No palpable cords. LYMPH NODES: No palpable cervical, supraclavicular, axillary or inguinal adenopathy  NEUROLOGICAL: Unremarkable. PSYCH:  Appropriate.   No visits with results within 3 Day(s) from this visit.  Latest known visit with results is:  Appointment on 09/09/2017  Component Date Value Ref Range Status  . Ferritin 09/09/2017 74  24 - 336 ng/mL Final   Performed at [facility], [address] [place], [place] [address]  . Sodium 09/09/2017 137  135 -  145 mmol/L Final  . Potassium 09/09/2017 4.2  3.5 - 5.1 mmol/L Final  . Chloride 09/09/2017 107  101 - 111 mmol/L Final  . CO2 09/09/2017 23  22 - 32 mmol/L Final  . Glucose, Bld 09/09/2017 105* 65 - 99 mg/dL Final  . BUN 09/09/2017 39* 6 - 20 mg/dL Final  . Creatinine, Ser 09/09/2017 1.47* 0.61 - 1.24 mg/dL Final  . Calcium 09/09/2017 8.9  8.9 - 10.3 mg/dL Final  . Total Protein 09/09/2017 7.1  6.5 - 8.1 g/dL Final  . Albumin 09/09/2017 4.1  3.5 - 5.0 g/dL Final  . AST 09/09/2017 21  15 - 41 U/L Final  . ALT 09/09/2017 16* 17 - 63 U/L Final  . Alkaline Phosphatase 09/09/2017 55  38 - 126 U/L Final  . Total Bilirubin 09/09/2017 0.4  0.3 - 1.2 mg/dL Final  . GFR calc non Af Amer 09/09/2017 47* >60 mL/min Final  . GFR calc Af Amer 09/09/2017 55* >60 mL/min Final   Comment: (NOTE) The eGFR has been calculated using the CKD EPI equation. This calculation has not been validated in all clinical situations. eGFR's persistently <60 mL/min signify possible Chronic Kidney Disease.   Georgiann Hahn gap 09/09/2017 7  5 - 15 Final   Performed at [facility], [address] [place], [place] [address]  . WBC 09/09/2017 8.7  3.8 - 10.6 K/uL Final  . RBC 09/09/2017 3.17* 4.40 - 5.90 MIL/uL Final  . Hemoglobin  09/09/2017 10.2* 13.0 - 18.0 g/dL Final  . HCT 09/09/2017 30.6* 40.0 - 52.0 % Final  . MCV 09/09/2017 96.6  80.0 - 100.0 fL Final  . MCH 09/09/2017 32.1  26.0 - 34.0 pg Final  . MCHC 09/09/2017 33.3  32.0 - 36.0 g/dL Final  . RDW 09/09/2017 13.4  11.5 - 14.5 % Final  . Platelets 09/09/2017 217  150 - 440 K/uL Final  . Neutrophils Relative % 09/09/2017 72  % Final  . Neutro Abs 09/09/2017 6.4  1.4 - 6.5 K/uL Final  . Lymphocytes Relative 09/09/2017 13  % Final  . Lymphs Abs 09/09/2017 1.1  1.0 - 3.6 K/uL Final  . Monocytes Relative 09/09/2017 10  % Final  . Monocytes Absolute 09/09/2017 0.9  0.2 - 1.0 K/uL Final  . Eosinophils Relative 09/09/2017 4  % Final  . Eosinophils Absolute 09/09/2017 0.3  0 - 0.7 K/uL Final  . Basophils Relative 09/09/2017 1  % Final  . Basophils Absolute 09/09/2017 0.0  0 - 0.1 K/uL Final   Performed at [facility], [address] [place], [place] [address]    Assessment:  AALIYAH CANCRO is a 67 y.o. male with stage III chronic kidney disease and anemia.  He has chronic kidney disease (creatinine clearance 37-41 ml/min). Diet is good.  He denies any melena, hematochezia, or hematuria.  He denies any pica or restless legs.  EGD and colonoscopy noted only reflux in 2015.  Guaiac cards were negative.  He never had a capsule study.    He was diagnosed with B12 deficiency in 2015.  He receives  monthy injections at [facility] (last 06/2017).  He was a regular blood donor until approximately 2013-2014 when he was told his "iron was low".   Work-up in 10/2014 revealed a hematocrit of 31.8, hemoglobin 10.3, MCV 95, with a normal WBC and platelet count.  B12 and folate were normal.  SPEP was normal on 07/09/2014.  Iron studies were normal with a saturation of 18% and  a TIBC of 297. Ferritin was 72.  Erythropoietin was 10.6.  Antinuclear antibody direct was positive with an anti-DNA double-stranded 24 (0-9).  Sedimentation rate was 3.  Haptoglobin was 181. Coombs was  negative.   Work-up on 08/10/2017 revealed a ferritin of 116.  Iron saturation was 14% with a TIBC of 372.  BUN was 59 with a Cr 1.98 (CrCl 33-38 ml/min).  Epo level was 8.8.  SPEP revealed no monoclonal protein.  Retic was 1%.  Urinalysis revealed no hematuria.  Symptomatically, he feels good.  Exam reveals 1+ lower extremity edema.  Hemoglobin 10.4, hematocrit 31.7, MCV 96, and platelets 214,000. BUN 44 and creatinine 1.88  Ferritin 151. Iron saturation 78% with a TIBC of 321.  Plan: 1.  Review [facility] labs.  Anemia of chronic disease stable. 2.  Discuss iron stores. Ferritin 151 on oral iron with vitamin C once daily.  No Venofer today. 3.  Discuss blood pressure. Reading elevated to 164/73 today. Denies missing antihypertensive doses. Patient encouraged to monitor at home and follow up with Dr. Smith Mince.  4.  Discuss renal function. BUN 44 with a creatinine of 1.88. Patient to follow up with Dr. Smith Mince.  5.  [facility] labs in 3 months (CBC with diff, BMP, B12, folate, ferritin) 6.  RTC in 6 months for MD assessment and review of [facility] labs, and +/- Venofer.     Honor Loh, NP  11/17/2017, 10:36 AM   I saw and evaluated the patient, participating in the key portions of the service and reviewing pertinent diagnostic studies and records.  I reviewed the nurse practitioner's note and agree with the findings and the plan.  The assessment and plan were discussed with the patient.  Several questions were asked by the patient and answered.   Nolon Stalls, MD 11/17/2017, 10:36 AM

## 2017-11-28 ENCOUNTER — Telehealth: Payer: Self-pay | Admitting: *Deleted

## 2017-11-28 NOTE — Telephone Encounter (Signed)
-----   Message from Lequita Asal, MD sent at 11/27/2017  5:21 PM EDT ----- Regarding: Please call patient  Ferritin 151.  OK to discontinue oral iron.  M

## 2017-11-28 NOTE — Telephone Encounter (Signed)
Called patient and LVM  Per MD that he can discontinue his oral iron.  Ferritin 151.

## 2017-12-13 ENCOUNTER — Encounter: Payer: Self-pay | Admitting: Oncology

## 2017-12-16 ENCOUNTER — Other Ambulatory Visit: Payer: Self-pay

## 2017-12-16 DIAGNOSIS — E538 Deficiency of other specified B group vitamins: Secondary | ICD-10-CM

## 2017-12-16 DIAGNOSIS — E785 Hyperlipidemia, unspecified: Secondary | ICD-10-CM

## 2017-12-16 DIAGNOSIS — D509 Iron deficiency anemia, unspecified: Secondary | ICD-10-CM

## 2017-12-16 DIAGNOSIS — E1122 Type 2 diabetes mellitus with diabetic chronic kidney disease: Secondary | ICD-10-CM

## 2017-12-16 DIAGNOSIS — N183 Chronic kidney disease, stage 3 unspecified: Secondary | ICD-10-CM

## 2017-12-16 DIAGNOSIS — N289 Disorder of kidney and ureter, unspecified: Secondary | ICD-10-CM

## 2017-12-16 MED ORDER — CYANOCOBALAMIN 1000 MCG/ML IJ SOLN
1000.0000 ug | Freq: Once | INTRAMUSCULAR | Status: AC
  Administered 2017-12-16: 1000 ug via INTRAMUSCULAR

## 2017-12-17 LAB — BASIC METABOLIC PANEL
BUN/Creatinine Ratio: 21 (ref 10–24)
BUN: 42 mg/dL — AB (ref 8–27)
CO2: 18 mmol/L — AB (ref 20–29)
Calcium: 9.1 mg/dL (ref 8.6–10.2)
Chloride: 106 mmol/L (ref 96–106)
Creatinine, Ser: 2.03 mg/dL — ABNORMAL HIGH (ref 0.76–1.27)
GFR calc Af Amer: 38 mL/min/{1.73_m2} — ABNORMAL LOW (ref >59)
GFR, EST NON AFRICAN AMERICAN: 32 mL/min/{1.73_m2} — AB (ref >59)
Glucose: 118 mg/dL — ABNORMAL HIGH (ref 65–99)
Potassium: 4.8 mmol/L (ref 3.5–5.2)
SODIUM: 141 mmol/L (ref 134–144)

## 2017-12-17 LAB — COMPREHENSIVE METABOLIC PANEL
A/G RATIO: 1.6 (ref 1.2–2.2)
ALT: 17 IU/L (ref 0–44)
AST: 25 IU/L (ref 0–40)
Albumin: 4 g/dL (ref 3.6–4.8)
Alkaline Phosphatase: 67 IU/L (ref 39–117)
BUN/Creatinine Ratio: 21 (ref 10–24)
BUN: 43 mg/dL — ABNORMAL HIGH (ref 8–27)
Bilirubin Total: <0.2 mg/dL (ref 0.0–1.2)
CALCIUM: 9 mg/dL (ref 8.6–10.2)
CO2: 19 mmol/L — ABNORMAL LOW (ref 20–29)
Chloride: 106 mmol/L (ref 96–106)
Creatinine, Ser: 2.07 mg/dL — ABNORMAL HIGH (ref 0.76–1.27)
GFR, EST AFRICAN AMERICAN: 37 mL/min/{1.73_m2} — AB (ref >59)
GFR, EST NON AFRICAN AMERICAN: 32 mL/min/{1.73_m2} — AB (ref >59)
GLOBULIN, TOTAL: 2.5 g/dL (ref 1.5–4.5)
Glucose: 120 mg/dL — ABNORMAL HIGH (ref 65–99)
POTASSIUM: 4.7 mmol/L (ref 3.5–5.2)
Sodium: 140 mmol/L (ref 134–144)
TOTAL PROTEIN: 6.5 g/dL (ref 6.0–8.5)

## 2017-12-17 LAB — LIPID PANEL
CHOL/HDL RATIO: 3.2 ratio (ref 0.0–5.0)
Cholesterol, Total: 139 mg/dL (ref 100–199)
HDL: 43 mg/dL (ref >39)
LDL CALC: 85 mg/dL (ref 0–99)
Triglycerides: 53 mg/dL (ref 0–149)
VLDL Cholesterol Cal: 11 mg/dL (ref 5–40)

## 2017-12-17 LAB — LDL CHOLESTEROL, DIRECT: LDL Direct: 92 mg/dL (ref 0–99)

## 2017-12-17 LAB — HEMOGLOBIN A1C
ESTIMATED AVERAGE GLUCOSE: 171 mg/dL
HEMOGLOBIN A1C: 7.6 % — AB (ref 4.8–5.6)

## 2017-12-17 LAB — HEMOGLOBIN: Hemoglobin: 9.5 g/dL — ABNORMAL LOW (ref 13.0–17.7)

## 2017-12-20 ENCOUNTER — Encounter: Payer: Self-pay | Admitting: Oncology

## 2017-12-27 ENCOUNTER — Encounter: Payer: Self-pay | Admitting: Medical

## 2017-12-27 ENCOUNTER — Ambulatory Visit: Payer: Self-pay | Admitting: Medical

## 2017-12-27 VITALS — BP 150/65 | HR 61 | Temp 97.5°F | Resp 16 | Ht 72.0 in | Wt 182.0 lb

## 2017-12-27 DIAGNOSIS — T148XXA Other injury of unspecified body region, initial encounter: Secondary | ICD-10-CM

## 2017-12-27 NOTE — Patient Instructions (Addendum)
Heating pad to site. Return in one week for recheck   Contusion    A contusion is a deep bruise. Contusions happen when an injury causes bleeding under the skin. Symptoms of bruising include pain, swelling, and discolored skin. The skin may turn blue, purple, or yellow. Follow these instructions at home:  Rest the injured area.  If told, put ice on the injured area. ? Put ice in a plastic bag. ? Place a towel between your skin and the bag. ? Leave the ice on for 20 minutes, 2-3 times per day.  If told, put light pressure (compression) on the injured area using an elastic bandage. Make sure the bandage is not too tight. Remove it and put it back on as told by your doctor.  If possible, raise (elevate) the injured area above the level of your heart while you are sitting or lying down.  Take over-the-counter and prescription medicines only as told by your doctor. Contact a doctor if:  Your symptoms do not get better after several days of treatment.  Your symptoms get worse.  You have trouble moving the injured area. Get help right away if:  You have very bad pain.  You have a loss of feeling (numbness) in a hand or foot.  Your hand or foot turns pale or cold. This information is not intended to replace advice given to you by your health care provider. Make sure you discuss any questions you have with your health care provider. Document Released: 02/02/2008 Document Revised: 01/22/2016 Document Reviewed: 01/01/2015 Elsevier Interactive Patient Education  2018 [employer].

## 2017-12-27 NOTE — Progress Notes (Signed)
   Subjective:    Patient ID: Joshua Cherry, male    DOB: [birth year], 67 y.o.   MRN: [id number]  HPI  67 yo male non acute distress. Started with bruise last Thursday on right leg , history of cramp last week on Tuesday. Initially sore mild discomfort.now. Denies any shortness of breath or chest pain. Sees  Dr. Everlena Cooper ([place]) and he did see Dr. Smith Mince [facility]) last Wednesday no buruse at that time..  Was on Iron supplement and stopped per Dr. Almetta Lovely in March due due lab values being wnl..  On ASA 81 mg /day. Tried see Dr. Inis Sizer.his PCP but he could not be seen till June. Denies itching. Denies any trauma to site.   Review of Systems  Constitutional: Negative for chills, fatigue and fever.  HENT: Positive for voice change (hoarse). Negative for congestion, ear pain and sore throat.   Eyes: Negative for discharge, itching and visual disturbance.  Respiratory: Negative for cough and shortness of breath.   Cardiovascular: Positive for leg swelling. Negative for chest pain and palpitations.  Gastrointestinal: Negative for abdominal distention, abdominal pain, diarrhea, nausea and vomiting.  Endocrine: Negative for polydipsia, polyphagia and polyuria.  Genitourinary: Negative for dysuria and hematuria.  Musculoskeletal: Positive for gait problem (trouble walking initially with the bruise). Negative for myalgias.  Skin: Positive for color change (bruising on right leg). Negative for rash.  Allergic/Immunologic: Negative for environmental allergies and food allergies.  Neurological: Negative for dizziness, syncope, light-headedness and headaches.  Hematological: Bruises/bleeds easily (right leg).  Psychiatric/Behavioral: Negative for behavioral problems, confusion, hallucinations, self-injury and suicidal ideas. The patient is not nervous/anxious.    Sick  2-3 ago with upper respiratory and sore throat.     Objective:   Physical Exam  Constitutional: He is oriented  to person, place, and time. He appears well-developed and well-nourished.  HENT:  Head: Normocephalic and atraumatic.  Eyes: Pupils are equal, round, and reactive to light. EOM are normal.  Cardiovascular: Normal rate, regular rhythm and normal heart sounds.  Pulmonary/Chest: Effort normal and breath sounds normal.  Musculoskeletal: Normal range of motion.  Neurological: He is alert and oriented to person, place, and time.  Skin: Skin is warm and dry. Capillary refill takes less than 2 seconds.  Psychiatric: He has a normal mood and affect. His behavior is normal. Judgment and thought content normal.  Nursing note and vitals reviewed.     Right leg: 1 + PT /2+ DP  1+ edema on lower leg. No calf tenderness Varicosities on foot and leg noted FROM 25 cm in length  x13 cm at its widest point located medial side of right thigh and lower leg.    Assessment & Plan:  Bruise right leg. Reviewed with Dr. Rosanna Randy. Ordered CBC MET C and  PTT/Pt/INR place in Epic. Recommended heating pad to site and one week follow up sooner if any other bruising occurs. Patient verbalizes understanding and has no questions at discharge.

## 2017-12-28 LAB — COMPREHENSIVE METABOLIC PANEL
A/G RATIO: 1.7 (ref 1.2–2.2)
ALBUMIN: 4.4 g/dL (ref 3.6–4.8)
ALT: 22 IU/L (ref 0–44)
AST: 31 IU/L (ref 0–40)
Alkaline Phosphatase: 67 IU/L (ref 39–117)
BUN / CREAT RATIO: 20 (ref 10–24)
BUN: 43 mg/dL — ABNORMAL HIGH (ref 8–27)
Bilirubin Total: 0.3 mg/dL (ref 0.0–1.2)
CO2: 19 mmol/L — ABNORMAL LOW (ref 20–29)
Calcium: 9.4 mg/dL (ref 8.6–10.2)
Chloride: 106 mmol/L (ref 96–106)
Creatinine, Ser: 2.2 mg/dL — ABNORMAL HIGH (ref 0.76–1.27)
GFR, EST AFRICAN AMERICAN: 34 mL/min/{1.73_m2} — AB (ref >59)
GFR, EST NON AFRICAN AMERICAN: 29 mL/min/{1.73_m2} — AB (ref >59)
Globulin, Total: 2.6 g/dL (ref 1.5–4.5)
Glucose: 122 mg/dL — ABNORMAL HIGH (ref 65–99)
POTASSIUM: 4.5 mmol/L (ref 3.5–5.2)
Sodium: 141 mmol/L (ref 134–144)
TOTAL PROTEIN: 7 g/dL (ref 6.0–8.5)

## 2017-12-28 LAB — CBC WITH DIFFERENTIAL/PLATELET
BASOS: 1 %
Basophils Absolute: 0 10*3/uL (ref 0.0–0.2)
EOS (ABSOLUTE): 0.2 10*3/uL (ref 0.0–0.4)
EOS: 4 %
HEMATOCRIT: 30.3 % — AB (ref 37.5–51.0)
HEMOGLOBIN: 9.9 g/dL — AB (ref 13.0–17.7)
IMMATURE GRANS (ABS): 0 10*3/uL (ref 0.0–0.1)
IMMATURE GRANULOCYTES: 0 %
LYMPHS: 20 %
Lymphocytes Absolute: 0.9 10*3/uL (ref 0.7–3.1)
MCH: 31.7 pg (ref 26.6–33.0)
MCHC: 32.7 g/dL (ref 31.5–35.7)
MCV: 97 fL (ref 79–97)
MONOCYTES: 14 %
Monocytes Absolute: 0.6 10*3/uL (ref 0.1–0.9)
NEUTROS ABS: 2.7 10*3/uL (ref 1.4–7.0)
NEUTROS PCT: 61 %
PLATELETS: 237 10*3/uL (ref 150–379)
RBC: 3.12 x10E6/uL — ABNORMAL LOW (ref 4.14–5.80)
RDW: 14 % (ref 12.3–15.4)
WBC: 4.4 10*3/uL (ref 3.4–10.8)

## 2017-12-28 LAB — PT AND PTT
APTT: 28 s (ref 24–33)
INR: 1.1 (ref 0.8–1.2)
Prothrombin Time: 11.4 s (ref 9.1–12.0)

## 2018-01-02 NOTE — Progress Notes (Signed)
Patient called 01/02/18 while covering [name] PA-C vacation inbox  and reviewed all lab results, he reports his bruising is much better. Denies any new symptoms. He is in agreement of labs being sent to his nephrologist and his oncologist/hematologist and he will follow up with them by phone call. He will pick up a copy of his labs at office visit on 01/03/18 and or sign a release to send to [name], MD his primary care MD.

## 2018-01-02 NOTE — Progress Notes (Signed)
Dr. Mike Gip,  Patients labs done in office for bruising. He reports bruising has improved and he has a follow up appointment with you in June. Will you please review patients labs and let me know that you received. He asked that these be forwarded to you, kidney function is still impaired - Hemoglobin decreased since last visit with you. Please let patient know if you prefer to see him earlier. Thanks, Laverna Peace MSN, AGNP-C, FNP-C  Dr Smith Mince Please review patients labs - kidney function appears relatively stable from his last appointment with you. Patient asked me to forward labs to your office and please let me know you received and if you prefer to see patient earlier than scheduled appointment Thanks, Laverna Peace MSN, AGNP-C, FNP-C

## 2018-01-03 ENCOUNTER — Ambulatory Visit: Payer: Self-pay | Admitting: Adult Health

## 2018-01-03 ENCOUNTER — Encounter: Payer: Self-pay | Admitting: Adult Health

## 2018-01-03 VITALS — BP 149/52 | HR 63 | Temp 98.2°F | Resp 16 | Wt 183.0 lb

## 2018-01-03 DIAGNOSIS — T148XXA Other injury of unspecified body region, initial encounter: Secondary | ICD-10-CM

## 2018-01-03 NOTE — Patient Instructions (Signed)
Varicocele A varicocele is a swelling of veins in the scrotum. The scrotum is the sac that contains the testicles. Varicoceles can occur on either side of the scrotum, but they are more common on the left side. They occur most often in teenage boys and young men. In most cases, varicoceles are not a serious problem. They are usually small and painless and do not require treatment. Tests may be done to confirm the diagnosis. Treatment may be needed if:  A varicocele is large, causes a lot of pain, or causes pain when exercising.  Varicoceles are found on both sides of the scrotum.  The testicle on the opposite side is absent or not normal.  A varicocele causes a decrease in the size of the testicle in a growing adolescent.  The person has fertility problems.  What are the causes? This condition is the result of valves in the veins not working properly. Valves in the veins help to return blood from the scrotum and testicles to the heart. If these valves do not work well, blood flows backward and backs up into the veins, which causes the veins to swell. This is similar to what happens when varicose veins form in the leg. What are the signs or symptoms? Most varicoceles do not cause any symptoms. If symptoms do occur, they may include:  Swelling on one side of the scrotum. The swelling may be more obvious when you are standing up.  A lumpy feeling in the scrotum.  A heavy feeling on one side of the scrotum.  A dull ache in the scrotum, especially after exercise or prolonged standing or sitting.  Slower growth or reduced size of the testicle on the side of the varicocele (in young males).  Problems with fertility. These can occur if the testicle does not grow normally.  How is this diagnosed? This condition may be diagnosed with a physical exam. You may also have an imaging test, called an ultrasound, to confirm the diagnosis and to help rule out other causes of the swelling. How is this  treated? Treatment is usually not needed for this condition. If you have any pain, your health care provider may prescribe or recommend medicine to help relieve it. You may need regular exams so your health care provider can monitor the varicocele to ensure that it does not cause problems. When further treatment is needed, it may involve one of these options:  Varicocelectomy. This is a surgery in which the swollen veins are tied off so that the flow of blood goes to other veins instead.  Embolization. In this procedure, a small tube (catheter) is used to place metal coils or other blocking items in the veins. This cuts off the blood flow to the swollen veins.  Follow these instructions at home:  Take medicines only as directed by your health care provider.  Wear supportive underwear.  Use an athletic supporter for sports.  Keep all follow-up visits as directed by your health care provider. This is important. Contact a health care provider if:  Your pain is increasing.  You have redness in the affected area.  You have swelling that does not decrease when you are lying down.  One of your testicles is smaller than the other.  Your testicle becomes enlarged, swollen, or painful. This information is not intended to replace advice given to you by your health care provider. Make sure you discuss any questions you have with your health care provider. Document Released: 11/22/2000 Document Revised: 01/28/2016  Document Reviewed: 07/24/2014 Elsevier Interactive Patient Education  2018 [place]. Chronic Kidney Disease, Adult Chronic kidney disease (CKD) happens when the kidneys are damaged during a time of 3 or more months. The kidneys are two organs that do many important jobs in the body. These jobs include:  Removing wastes and extra fluids from the blood.  Making hormones that maintain the amount of fluid in your tissues and blood vessels.  Making sure that the body has the right  amount of fluids and chemicals.  Most of the time, this condition does not go away, but it can usually be controlled. Steps must be taken to slow down the kidney damage or stop it from getting worse. Otherwise, the kidneys may stop working. Follow these instructions at home:  Follow your diet as told by your doctor. You may need to avoid alcohol, salty foods (sodium), and foods that are high in potassium, calcium, and protein.  Take over-the-counter and prescription medicines only as told by your doctor. Do not take any new medicines unless your doctor says you can do that. These include vitamins and minerals. ? Medicines and nutritional supplements can make kidney damage worse. ? Your doctor may need to change how much medicine you take.  Do not use any tobacco products. These include cigarettes, chewing tobacco, and e-cigarettes. If you need help quitting, ask your doctor.  Keep all follow-up visits as told by your doctor. This is important.  Check your blood pressure. Tell your doctor if there are changes to your blood pressure.  Get to a healthy weight. Stay at that weight. If you need help with this, ask your doctor.  Start or continue an exercise plan. Try to exercise at least 30 minutes a day, 5 days a week.  Stay up-to-date with your shots (immunizations) as told by your doctor. Contact a doctor if:  Your symptoms get worse.  You have new symptoms. Get help right away if:  You have symptoms of end-stage kidney disease. These include: ? Headaches. ? Skin that is darker or lighter than normal. ? Numbness in your hands or feet. ? Easy bruising. ? Having hiccups often. ? Chest pain. ? Shortness of breath. ? Stopping of menstrual periods in women.  You have a fever.  You are making very little pee (urine).  You have pain or bleeding when you pee (urinate). This information is not intended to replace advice given to you by your health care provider. Make sure you discuss  any questions you have with your health care provider. Document Released: 11/10/2009 Document Revised: 01/22/2016 Document Reviewed: 04/14/2012 Elsevier Interactive Patient Education  2017 [place] A contusion is a deep bruise. Contusions happen when an injury causes bleeding under the skin. Symptoms of bruising include pain, swelling, and discolored skin. The skin may turn blue, purple, or yellow. Follow these instructions at home:  Rest the injured area.  If told, put ice on the injured area. ? Put ice in a plastic bag. ? Place a towel between your skin and the bag. ? Leave the ice on for 20 minutes, 2-3 times per day.  If told, put light pressure (compression) on the injured area using an elastic bandage. Make sure the bandage is not too tight. Remove it and put it back on as told by your doctor.  If possible, raise (elevate) the injured area above the level of your heart while you are sitting or lying down.  Take over-the-counter and prescription medicines only as told  by your doctor. Contact a doctor if:  Your symptoms do not get better after several days of treatment.  Your symptoms get worse.  You have trouble moving the injured area. Get help right away if:  You have very bad pain.  You have a loss of feeling (numbness) in a hand or foot.  Your hand or foot turns pale or cold. This information is not intended to replace advice given to you by your health care provider. Make sure you discuss any questions you have with your health care provider. Document Released: 02/02/2008 Document Revised: 01/22/2016 Document Reviewed: 01/01/2015 Elsevier Interactive Patient Education  2018 [employer].

## 2018-01-03 NOTE — Progress Notes (Signed)
Subjective:     Patient ID: Joshua Cherry, male   DOB: 03/05/1951, 67 y.o.   MRN: [id number]  HPI Blood pressure (!) 149/52, pulse 63, temperature 98.2 F (36.8 C), temperature source Tympanic, resp. rate 16, weight 183 lb (83 kg), SpO2 100 %.  Patient is a 67 year old male in no acute distress who returns  To the clinic for a follow up on his bruise of right leg that he was seen for on 12/27/17 by colleague [name] PA-C. He reports bruise is almost resolved and looking much better than his last visit. He denies any pain in leg or any pain otherwise. He reports he is feeling well. Denies any unusual bruising or bleeding.   He has no new signs or symptoms at this visit.  Patient  denies any fever, body aches,chills, rash, chest pain, shortness of breath, nausea, vomiting, or diarrhea.   Patient Active Problem List   Diagnosis Date Noted  . B12 deficiency 11/17/2017  . Anemia of chronic kidney failure, stage 3 (moderate) ([place]) 09/12/2017  . Diabetes 1.5, managed as type 2 ([place]) 04/29/2017  . Abnormal kidney function 04/27/2017  . Anemia 02/22/2015  . Diabetes mellitus type 1.5 ([place]) 02/21/2015  . Benign fibroma of prostate 03/19/2014  . Elevated lipids 03/19/2014  . Acid reflux 03/19/2014  . Diabetes mellitus, type 2 ([place]) 03/19/2014  . BP (high blood pressure) 03/19/2014  . Anemia, iron deficiency 03/19/2014  . Benign prostatic hyperplasia 03/19/2014  . DM (diabetes mellitus), type 2 with renal complications ([place]) 27/01/2375  . GERD (gastroesophageal reflux disease) 03/19/2014  . Hypertension 03/19/2014  . IDA (iron deficiency anemia) 03/19/2014    Review of Systems  Constitutional: Negative.   HENT: Negative.   Eyes: Negative.   Respiratory: Negative.   Cardiovascular: Negative.   Gastrointestinal: Negative.   Genitourinary: Negative for decreased urine volume, difficulty urinating, discharge, dysuria, enuresis, flank pain, frequency, genital sores, hematuria,  penile pain, penile swelling, scrotal swelling, testicular pain and urgency.  Musculoskeletal: Negative.   Skin: Positive for color change. Negative for pallor, rash and wound.       Healing bruise right leg- denies any other bruising or skin rash/ wound   Allergic/Immunologic: Negative.   Neurological: Negative.   Hematological: Negative for adenopathy. Bruises/bleeds easily.  Psychiatric/Behavioral: Negative.        Objective:   Physical Exam  Constitutional: He is oriented to person, place, and time. He appears well-developed and well-nourished.  HENT:  Head: Normocephalic and atraumatic.  Eyes: Pupils are equal, round, and reactive to light. EOM are normal.  Cardiovascular: Normal rate, regular rhythm and normal heart sounds.  Pulmonary/Chest: Effort normal and breath sounds normal.  Musculoskeletal: Normal range of motion.  Neurological: He is alert and oriented to person, place, and time.  Skin: Skin is warm and dry. Capillary refill takes less than 2 seconds. Bruising noted.     Bruise to right medial /lateral thigh resolving now brown in color and very fine and scattered.  No hematoma noted.  No pain at site with palpation  Psychiatric: He has a normal mood and affect. His behavior is normal. Judgment and thought content normal.  Nursing note and vitals reviewed.   ( Right leg 2 + dorsalis pedis and 1 + posterior tibial)  No leg or calf pain/ tenderness Multiple varicosities     Assessment:     Bruise    Plan:     He has a copy of his labs  Labs  were also sent to nephrologist/ hematologist and his [name], [name], MD.  He will follow up with his specialist and PCP within the next week or if any concerns develop. Provider discussed his labs/ reviewed kidney function and anemia and need for keeping specialist appointments. Avoiding NSAID'S etc.    Advised patient call the office or your primary care doctor for an appointment if no improvement within 72 hours or  if any symptoms change or worsen at any time  Advised ER or urgent Care if after hours or on weekend. Call 911 for emergency symptoms at any time.Patinet verbalized understanding of all instructions given/reviewed and treatment plan and has no further questions or concerns at this time.    Patient verbalized understanding of all instructions given and denies any further questions at this time.

## 2018-01-12 ENCOUNTER — Ambulatory Visit: Payer: Self-pay

## 2018-01-12 DIAGNOSIS — E538 Deficiency of other specified B group vitamins: Secondary | ICD-10-CM

## 2018-01-12 MED ORDER — CYANOCOBALAMIN 1000 MCG/ML IJ SOLN
1000.0000 ug | Freq: Once | INTRAMUSCULAR | Status: AC
  Administered 2018-01-12: 1000 ug via INTRAMUSCULAR

## 2018-02-02 ENCOUNTER — Other Ambulatory Visit: Payer: Self-pay

## 2018-02-02 ENCOUNTER — Ambulatory Visit: Payer: Self-pay

## 2018-02-02 DIAGNOSIS — E538 Deficiency of other specified B group vitamins: Secondary | ICD-10-CM

## 2018-02-02 DIAGNOSIS — D509 Iron deficiency anemia, unspecified: Secondary | ICD-10-CM

## 2018-02-07 ENCOUNTER — Ambulatory Visit: Payer: Self-pay

## 2018-02-07 DIAGNOSIS — D509 Iron deficiency anemia, unspecified: Secondary | ICD-10-CM

## 2018-02-07 DIAGNOSIS — E538 Deficiency of other specified B group vitamins: Secondary | ICD-10-CM

## 2018-02-07 MED ORDER — CYANOCOBALAMIN 1000 MCG/ML IJ SOLN
1000.0000 ug | Freq: Once | INTRAMUSCULAR | Status: AC
  Administered 2018-02-07: 1000 ug via INTRAMUSCULAR

## 2018-02-08 LAB — CBC WITH DIFFERENTIAL/PLATELET
BASOS: 1 %
Basophils Absolute: 0 10*3/uL (ref 0.0–0.2)
EOS (ABSOLUTE): 0.5 10*3/uL — AB (ref 0.0–0.4)
Eos: 11 %
Hematocrit: 30.5 % — ABNORMAL LOW (ref 37.5–51.0)
Hemoglobin: 9.7 g/dL — ABNORMAL LOW (ref 13.0–17.7)
IMMATURE GRANS (ABS): 0 10*3/uL (ref 0.0–0.1)
Immature Granulocytes: 0 %
LYMPHS: 26 %
Lymphocytes Absolute: 1.2 10*3/uL (ref 0.7–3.1)
MCH: 31.2 pg (ref 26.6–33.0)
MCHC: 31.8 g/dL (ref 31.5–35.7)
MCV: 98 fL — AB (ref 79–97)
Monocytes Absolute: 0.7 10*3/uL (ref 0.1–0.9)
Monocytes: 16 %
NEUTROS ABS: 2.1 10*3/uL (ref 1.4–7.0)
Neutrophils: 46 %
PLATELETS: 222 10*3/uL (ref 150–450)
RBC: 3.11 x10E6/uL — ABNORMAL LOW (ref 4.14–5.80)
RDW: 13.9 % (ref 12.3–15.4)
WBC: 4.5 10*3/uL (ref 3.4–10.8)

## 2018-02-08 LAB — B12 AND FOLATE PANEL: VITAMIN B 12: 698 pg/mL (ref 232–1245)

## 2018-02-08 LAB — BASIC METABOLIC PANEL
BUN / CREAT RATIO: 26 — AB (ref 10–24)
BUN: 47 mg/dL — AB (ref 8–27)
CO2: 19 mmol/L — ABNORMAL LOW (ref 20–29)
Calcium: 9.1 mg/dL (ref 8.6–10.2)
Chloride: 108 mmol/L — ABNORMAL HIGH (ref 96–106)
Creatinine, Ser: 1.8 mg/dL — ABNORMAL HIGH (ref 0.76–1.27)
GFR calc non Af Amer: 38 mL/min/{1.73_m2} — ABNORMAL LOW (ref >59)
GFR, EST AFRICAN AMERICAN: 43 mL/min/{1.73_m2} — AB (ref >59)
Glucose: 119 mg/dL — ABNORMAL HIGH (ref 65–99)
Potassium: 5.3 mmol/L — ABNORMAL HIGH (ref 3.5–5.2)
Sodium: 139 mmol/L (ref 134–144)

## 2018-02-08 LAB — FERRITIN: Ferritin: 151 ng/mL (ref 30–400)

## 2018-02-09 ENCOUNTER — Ambulatory Visit: Payer: BLUE CROSS/BLUE SHIELD | Admitting: Hematology and Oncology

## 2018-02-15 ENCOUNTER — Other Ambulatory Visit: Payer: Self-pay | Admitting: *Deleted

## 2018-02-15 ENCOUNTER — Encounter: Payer: Self-pay | Admitting: *Deleted

## 2018-02-15 DIAGNOSIS — E538 Deficiency of other specified B group vitamins: Secondary | ICD-10-CM

## 2018-02-15 MED ORDER — CYANOCOBALAMIN 1000 MCG/ML IJ SOLN
1000.0000 ug | INTRAMUSCULAR | Status: AC
  Administered 2018-03-16 – 2018-05-10 (×2): 1000 ug via INTRAMUSCULAR

## 2018-02-15 NOTE — Progress Notes (Signed)
Received order via fax from [facility] for B12 injections monthly for next 12 months. Order entered into Epic by Probation officer. Hard copy scanned into chart under media tab and copy kept in office.

## 2018-03-08 ENCOUNTER — Other Ambulatory Visit: Payer: Self-pay

## 2018-03-08 DIAGNOSIS — N401 Enlarged prostate with lower urinary tract symptoms: Secondary | ICD-10-CM

## 2018-03-08 DIAGNOSIS — R972 Elevated prostate specific antigen [PSA]: Secondary | ICD-10-CM

## 2018-03-08 DIAGNOSIS — N138 Other obstructive and reflux uropathy: Secondary | ICD-10-CM

## 2018-03-09 LAB — CREATININE, SERUM
Creatinine, Ser: 1.55 mg/dL — ABNORMAL HIGH (ref 0.76–1.27)
GFR calc Af Amer: 52 mL/min/{1.73_m2} — ABNORMAL LOW (ref >59)
GFR, EST NON AFRICAN AMERICAN: 45 mL/min/{1.73_m2} — AB (ref >59)

## 2018-03-09 LAB — PSA: Prostate Specific Ag, Serum: 2.6 ng/mL (ref 0.0–4.0)

## 2018-03-16 ENCOUNTER — Ambulatory Visit: Payer: Self-pay | Admitting: Adult Health

## 2018-03-16 ENCOUNTER — Ambulatory Visit: Payer: Self-pay

## 2018-05-10 ENCOUNTER — Other Ambulatory Visit: Payer: Self-pay

## 2018-05-10 DIAGNOSIS — N138 Other obstructive and reflux uropathy: Secondary | ICD-10-CM

## 2018-05-10 DIAGNOSIS — N401 Enlarged prostate with lower urinary tract symptoms: Secondary | ICD-10-CM

## 2018-05-10 DIAGNOSIS — R972 Elevated prostate specific antigen [PSA]: Secondary | ICD-10-CM

## 2018-05-11 LAB — CREATININE, SERUM
Creatinine, Ser: 2.37 mg/dL — ABNORMAL HIGH (ref 0.76–1.27)
GFR calc Af Amer: 31 mL/min/{1.73_m2} — ABNORMAL LOW (ref >59)
GFR calc non Af Amer: 27 mL/min/{1.73_m2} — ABNORMAL LOW (ref >59)

## 2018-05-11 LAB — PSA: Prostate Specific Ag, Serum: 2.2 ng/mL (ref 0.0–4.0)

## 2018-05-18 ENCOUNTER — Encounter: Payer: Self-pay | Admitting: Hematology and Oncology

## 2018-05-18 ENCOUNTER — Inpatient Hospital Stay: Payer: BLUE CROSS/BLUE SHIELD | Attending: Hematology and Oncology | Admitting: Hematology and Oncology

## 2018-05-18 ENCOUNTER — Inpatient Hospital Stay: Payer: BLUE CROSS/BLUE SHIELD

## 2018-05-18 ENCOUNTER — Other Ambulatory Visit: Payer: Self-pay

## 2018-05-18 VITALS — BP 144/64 | HR 55 | Temp 96.4°F | Resp 18 | Wt 175.5 lb

## 2018-05-18 DIAGNOSIS — D631 Anemia in chronic kidney disease: Secondary | ICD-10-CM | POA: Insufficient documentation

## 2018-05-18 DIAGNOSIS — N183 Chronic kidney disease, stage 3 unspecified: Secondary | ICD-10-CM

## 2018-05-18 DIAGNOSIS — R011 Cardiac murmur, unspecified: Secondary | ICD-10-CM

## 2018-05-18 DIAGNOSIS — Z7982 Long term (current) use of aspirin: Secondary | ICD-10-CM | POA: Diagnosis not present

## 2018-05-18 DIAGNOSIS — E538 Deficiency of other specified B group vitamins: Secondary | ICD-10-CM

## 2018-05-18 DIAGNOSIS — I129 Hypertensive chronic kidney disease with stage 1 through stage 4 chronic kidney disease, or unspecified chronic kidney disease: Secondary | ICD-10-CM | POA: Diagnosis not present

## 2018-05-18 DIAGNOSIS — E1122 Type 2 diabetes mellitus with diabetic chronic kidney disease: Secondary | ICD-10-CM | POA: Insufficient documentation

## 2018-05-18 DIAGNOSIS — Z79899 Other long term (current) drug therapy: Secondary | ICD-10-CM | POA: Diagnosis not present

## 2018-05-18 DIAGNOSIS — R600 Localized edema: Secondary | ICD-10-CM | POA: Diagnosis not present

## 2018-05-18 DIAGNOSIS — K219 Gastro-esophageal reflux disease without esophagitis: Secondary | ICD-10-CM | POA: Diagnosis not present

## 2018-05-18 NOTE — Progress Notes (Signed)
Patient here for follow up. No concerns voiced.  °

## 2018-05-18 NOTE — Progress Notes (Signed)
[facility] day:  05/18/2018  Chief Complaint: Joshua Cherry is a 68 y.o. male with stage III chronic kidney disease and anemia who is seen for 6 month assessment.  HPI:  The patient last seen in the hematology clinic on 11/17/2017.  At that time,  he felt good.  Exam revealed 1+ lower extremity edema.  Hemoglobin was 10.4, hematocrit 31.7, MCV 96, and platelets 214,000. BUN was 44 and creatinine 1.88  Ferritin was 151. Iron saturation 78% with a TIBC of 32  Labs on 02/07/2018 revealed a hematocrit of 30.5, hemoglobin 9.7, and MCV 98.  Ferritin was 151.  Folate was > 20.  B12 was 698.  Labs on 05/10/2018 revealed a creatinine 2.37.  PSA was 2.2.  During the interim, he denies any complaints.  He has lost 8 pounds.  He denies any nausea, vomiting or diarrhea.  He has continued follow-up with Dr. Smith Mince.  He has not received Procrit during the interim.   Past Medical History:  Diagnosis Date  . Anemia   . Chickenpox   . Chronic kidney disease   . Diabetes mellitus without complication ([place])   . GERD (gastroesophageal reflux disease)   . Heart murmur    followed by PCP  . Hypertension   . Prostate enlargement   . Torn meniscus    right    Past Surgical History:  Procedure Laterality Date  . CATARACT EXTRACTION W/PHACO Left 09/08/2016   Procedure: CATARACT EXTRACTION PHACO AND INTRAOCULAR LENS PLACEMENT (IOC);  Surgeon: Leandrew Koyanagi, MD;  Location: [facility];  Service: Ophthalmology;  Laterality: Left;  DIABETIC left  . CATARACT EXTRACTION W/PHACO Right 09/29/2016   Procedure: CATARACT EXTRACTION PHACO AND INTRAOCULAR LENS PLACEMENT ([place])  right eye;  Surgeon: Leandrew Koyanagi, MD;  Location: [facility];  Service: Ophthalmology;  Laterality: Right;  Diabetic - oral meds Right Eye IVA Topical  . COLONOSCOPY    . COLONOSCOPY    . ESOPHAGOGASTRODUODENOSCOPY    . ESOPHAGOGASTRODUODENOSCOPY      Family  History  Problem Relation Age of Onset  . Diabetes Mother   . Hypertension Mother   . Colon cancer Mother   . Hyperlipidemia Mother   . CAD Mother   . Glaucoma Mother   . Heart disease Mother   . Heart attack Mother   . Stroke Mother   . Diabetes Father   . Alzheimer's disease Father   . Hypertension Father   . Hyperlipidemia Father   . Hip fracture Father   . Diabetes Sister   . Glaucoma Sister   . Cancer Maternal Grandmother     Social History:  reports that he has never smoked. He has never used smokeless tobacco. He reports that he does not drink alcohol or use drugs.  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  The patient is alone today.  Allergies: No Known Allergies  Current Medications: Current Outpatient Medications  Medication Sig Dispense Refill  . aspirin EC 81 MG tablet Take 81 mg by mouth daily.     . B Complex-C (B COMPLEX-VITAMIN C) CAPS Take by mouth.    . carvedilol (COREG) 3.125 MG tablet Take 3.125 mg by mouth 2 (two) times daily with a meal.     . chlorthalidone (HYGROTON) 25 MG tablet Take 25 mg by mouth daily.    . Cholecalciferol (VITAMIN D3) 2000 UNITS capsule Take 2,000 Units by mouth  daily.     . cyanocobalamin (,VITAMIN B-12,) 1000 MCG/ML injection ADMINISTER 1 ML INTRAMUSCULARLY ONCE A MONTH    . finasteride (PROSCAR) 5 MG tablet Take 5 mg by mouth daily.     Marland Kitchen glipiZIDE (GLUCOTROL) 10 MG tablet Take by mouth. Take 1 tablet (10 mg total) by mouth 2 (two) times daily before meals    . lisinopril (PRINIVIL,ZESTRIL) 40 MG tablet Take 40 mg by mouth daily.    Marland Kitchen lovastatin (MEVACOR) 20 MG tablet Take 40 mg by mouth daily at 6 PM.     . omeprazole (PRILOSEC) 20 MG capsule Take 20 mg by mouth 2 (two) times daily before a meal.     . ONETOUCH DELICA LANCETS 25D MISC     . pioglitazone (ACTOS) 45 MG tablet Take 45 mg by mouth daily.     . sitaGLIPtin (JANUVIA) 25 MG tablet Take 25 mg by mouth daily.     Marland Kitchen  spironolactone (ALDACTONE) 25 MG tablet Take 0.5 tablets by mouth daily.    . tamsulosin (FLOMAX) 0.4 MG CAPS capsule Take 0.4 mg by mouth daily after breakfast.      Current Facility-Administered Medications  Medication Dose Route Frequency Provider Last Rate Last Dose  . cyanocobalamin ((VITAMIN B-12)) injection 1,000 mcg  1,000 mcg Intramuscular Q30 days [name], MD   1,000 mcg at 05/10/18 6644    Review of Systems:  GENERAL:  Feels "fine".  No fevers, sweats.  Weight down 8 pounds. PERFORMANCE STATUS (ECOG):  0 HEENT:  No visual changes, runny nose, sore throat, mouth sores or tenderness. Lungs: No shortness of breath or cough.  No hemoptysis. Cardiac:  No chest pain, palpitations, orthopnea, or PND. GI:  No nausea, vomiting, diarrhea, constipation, melena or hematochezia. No pica. GU:  No urgency, frequency, dysuria, or hematuria. Musculoskeletal:  No back pain.  No joint pain.  No muscle tenderness. Extremities:  No pain or swelling. Skin:  No rashes or skin changes. Neuro:  No headache, numbness or weakness, balance or coordination issues.  No restless legs. Endocrine:  Diabetes.  No thyroid issues, hot flashes or night sweats. Psych:  No mood changes, depression or anxiety. Pain:  No focal pain. Review of systems:  All other systems reviewed and found to be negative.   Physical Exam: Blood pressure (!) 144/64, pulse (!) 55, temperature (!) 96.4 F (35.8 C), temperature source Tympanic, resp. rate 18, weight 175 lb 8 oz (79.6 kg). GENERAL:  Well developed, well nourished, gentleman sitting comfortably in the exam room in no acute distress. MENTAL STATUS:  Alert and oriented to person, place and time. HEAD:  Short gray hair.  Normocephalic, atraumatic, face symmetric, no Cushingoid features. EYES:  Blue eyes.  Pupils equal round and reactive to light and accomodation.  No conjunctivitis or scleral icterus. ENT:  Oropharynx clear without lesion.  Tongue normal. Mucous  membranes moist.  RESPIRATORY:  Clear to auscultation without rales, wheezes or rhonchi. CARDIOVASCULAR:  Regular rate and rhythm without murmur, rub or gallop. ABDOMEN:  Soft, non-tender, with active bowel sounds, and no hepatosplenomegaly.  No masses. SKIN:  No rashes, ulcers or lesions. EXTREMITIES: No edema, no skin discoloration or tenderness.  No palpable cords. LYMPH NODES: No palpable cervical, supraclavicular, axillary or inguinal adenopathy  NEUROLOGICAL: Unremarkable. PSYCH:  Appropriate.    No visits with results within 3 Day(s) from this visit.  Latest known visit with results is:  Lab on 05/10/2018  Component Date Value Ref Range Status  .  Prostate Specific Ag, Serum 05/10/2018 2.2  0.0 - 4.0 ng/mL Final   Comment: Roche ECLIA methodology. According to the American Urological Association, Serum PSA should decrease and remain at undetectable levels after radical prostatectomy. The AUA defines biochemical recurrence as an initial PSA value 0.2 ng/mL or greater followed by a subsequent confirmatory PSA value 0.2 ng/mL or greater. Values obtained with different assay methods or kits cannot be used interchangeably. Results cannot be interpreted as absolute evidence of the presence or absence of malignant disease.   . Creatinine, Ser 05/10/2018 2.37* 0.76 - 1.27 mg/dL Final  . GFR calc non Af Amer 05/10/2018 27* >59 mL/min/1.73 Final  . GFR calc Af Amer 05/10/2018 31* >59 mL/min/1.73 Final    Assessment:  Joshua Cherry is a 67 y.o. male with stage III chronic kidney disease and anemia.  He has chronic kidney disease (creatinine clearance 37-41 ml/min). Diet is good.  He denies any melena, hematochezia, or hematuria.  He denies any pica or restless legs.  EGD and colonoscopy noted only reflux in 2015.  Guaiac cards were negative.  He never had a capsule study.    He was diagnosed with B12 deficiency in 2015.  He receives  monthy injections at [facility].  He was a regular  blood donor until approximately 2013-2014 when he was told his "iron was low".   Work-up in 10/2014 revealed a hematocrit of 31.8, hemoglobin 10.3, MCV 95, with a normal WBC and platelet count.  B12 and folate were normal.  SPEP was normal on 07/09/2014.  Iron studies were normal with a saturation of 18% and a TIBC of 297. Ferritin was 72.  Erythropoietin was 10.6.  Antinuclear antibody direct was positive with an anti-DNA double-stranded 24 (0-9).  Sedimentation rate was 3.  Haptoglobin was 181. Coombs was negative.   Work-up on 08/10/2017 revealed a ferritin of 116.  Iron saturation was 14% with a TIBC of 372.  BUN was 59 with a Cr 1.98 (CrCl 33-38 ml/min).  Epo level was 8.8.  SPEP revealed no monoclonal protein.  Retic was 1%.  Urinalysis revealed no hematuria.  Symptomatically, he denies any complaint.  He has lost 8 pounds.  Exam is unremarkable.  Creatinine was 2.37 on 05/10/2018.  Plan: 1.  Review [facility] labs. 2.  Anemia of chronic renal disease:  No interval Procrit per patient.  Ferritin 151 on 02/07/2018.  No Venofer needed. 3.  B12 deficiency:  Continue B12 injections at [facility].  Check folate periodically. 4.  [facility] slips provided. 5.  RTC in 6 months for MD assessment, review of [facility] labs, and +/- Venofer.    Lequita Asal, MD  05/18/2018, 4:35 PM

## 2018-05-29 ENCOUNTER — Telehealth: Payer: Self-pay | Admitting: *Deleted

## 2018-05-29 ENCOUNTER — Other Ambulatory Visit: Payer: Self-pay | Admitting: Urgent Care

## 2018-05-29 DIAGNOSIS — D509 Iron deficiency anemia, unspecified: Secondary | ICD-10-CM

## 2018-05-29 NOTE — Telephone Encounter (Signed)
Orders placed for CBC with diff, CMP, and ferritin for 05/31/2018, and then again in 6 months. They were put under "clinic collect" with the resulting agency as "[facility]" as requested. Let me know if there are problems.   Gaspar Bidding

## 2018-05-29 NOTE — Telephone Encounter (Signed)
Call requesting clarification order. States they have 2 [facility] sheets one with no labs marked and another with no date with [place], CBC, CMP. What do you want drawn Wednesday. She states that orders can be entered in Epic under [employer] resulting agency [facility]

## 2018-05-30 NOTE — Telephone Encounter (Signed)
Confirmed with Velna Hatchet that orders can be seen in Epic for 10/2 and again in 6 months. She wants to thank Gaspar Bidding for doing this

## 2018-05-31 ENCOUNTER — Other Ambulatory Visit: Payer: Self-pay

## 2018-05-31 DIAGNOSIS — D509 Iron deficiency anemia, unspecified: Secondary | ICD-10-CM

## 2018-05-31 NOTE — Addendum Note (Signed)
Addended by: Dahlia Client on: 05/31/2018 08:44 AM   Modules accepted: Orders

## 2018-06-01 LAB — COMPREHENSIVE METABOLIC PANEL
A/G RATIO: 2 (ref 1.2–2.2)
ALT: 20 IU/L (ref 0–44)
AST: 28 IU/L (ref 0–40)
Albumin: 4.5 g/dL (ref 3.6–4.8)
Alkaline Phosphatase: 48 IU/L (ref 39–117)
BUN/Creatinine Ratio: 27 — ABNORMAL HIGH (ref 10–24)
BUN: 60 mg/dL — AB (ref 8–27)
Bilirubin Total: 0.2 mg/dL (ref 0.0–1.2)
CALCIUM: 9.3 mg/dL (ref 8.6–10.2)
CO2: 19 mmol/L — ABNORMAL LOW (ref 20–29)
Chloride: 105 mmol/L (ref 96–106)
Creatinine, Ser: 2.2 mg/dL — ABNORMAL HIGH (ref 0.76–1.27)
GFR, EST AFRICAN AMERICAN: 34 mL/min/{1.73_m2} — AB (ref >59)
GFR, EST NON AFRICAN AMERICAN: 29 mL/min/{1.73_m2} — AB (ref >59)
Globulin, Total: 2.3 g/dL (ref 1.5–4.5)
Glucose: 64 mg/dL — ABNORMAL LOW (ref 65–99)
Potassium: 5.4 mmol/L — ABNORMAL HIGH (ref 3.5–5.2)
Sodium: 140 mmol/L (ref 134–144)
Total Protein: 6.8 g/dL (ref 6.0–8.5)

## 2018-06-01 LAB — CBC WITH DIFFERENTIAL/PLATELET
Basophils Absolute: 0 10*3/uL (ref 0.0–0.2)
Basos: 0 %
EOS (ABSOLUTE): 0.3 10*3/uL (ref 0.0–0.4)
Eos: 4 %
HEMATOCRIT: 27.7 % — AB (ref 37.5–51.0)
Hemoglobin: 9 g/dL — ABNORMAL LOW (ref 13.0–17.7)
IMMATURE GRANS (ABS): 0 10*3/uL (ref 0.0–0.1)
Immature Granulocytes: 0 %
LYMPHS: 13 %
Lymphocytes Absolute: 0.9 10*3/uL (ref 0.7–3.1)
MCH: 31.9 pg (ref 26.6–33.0)
MCHC: 32.5 g/dL (ref 31.5–35.7)
MCV: 98 fL — ABNORMAL HIGH (ref 79–97)
Monocytes Absolute: 0.7 10*3/uL (ref 0.1–0.9)
Monocytes: 10 %
NEUTROS PCT: 73 %
Neutrophils Absolute: 4.8 10*3/uL (ref 1.4–7.0)
PLATELETS: 207 10*3/uL (ref 150–450)
RBC: 2.82 x10E6/uL — AB (ref 4.14–5.80)
RDW: 13.2 % (ref 12.3–15.4)
WBC: 6.7 10*3/uL (ref 3.4–10.8)

## 2018-06-01 LAB — FERRITIN: Ferritin: 176 ng/mL (ref 30–400)

## 2018-07-30 ENCOUNTER — Encounter: Payer: Self-pay | Admitting: Hematology and Oncology

## 2018-10-23 ENCOUNTER — Ambulatory Visit: Admit: 2018-10-23 | Payer: BLUE CROSS/BLUE SHIELD | Admitting: Unknown Physician Specialty

## 2018-10-23 SURGERY — COLONOSCOPY WITH PROPOFOL
Anesthesia: General

## 2018-11-17 ENCOUNTER — Ambulatory Visit: Payer: BLUE CROSS/BLUE SHIELD

## 2018-11-17 ENCOUNTER — Ambulatory Visit: Payer: BLUE CROSS/BLUE SHIELD | Admitting: Urgent Care

## 2018-11-17 ENCOUNTER — Ambulatory Visit: Payer: BLUE CROSS/BLUE SHIELD | Admitting: Hematology and Oncology

## 2018-11-27 ENCOUNTER — Ambulatory Visit
Admission: RE | Admit: 2018-11-27 | Payer: Medicare HMO | Source: Home / Self Care | Admitting: Unknown Physician Specialty

## 2018-11-27 ENCOUNTER — Encounter: Admission: RE | Payer: Self-pay | Source: Home / Self Care

## 2018-11-27 SURGERY — COLONOSCOPY WITH PROPOFOL
Anesthesia: General

## 2018-12-29 ENCOUNTER — Encounter: Payer: Self-pay | Admitting: Hematology and Oncology

## 2019-02-21 ENCOUNTER — Inpatient Hospital Stay: Payer: Medicare HMO | Attending: Hematology and Oncology

## 2019-02-21 ENCOUNTER — Other Ambulatory Visit: Payer: Self-pay

## 2019-02-21 DIAGNOSIS — E538 Deficiency of other specified B group vitamins: Secondary | ICD-10-CM

## 2019-02-21 DIAGNOSIS — D631 Anemia in chronic kidney disease: Secondary | ICD-10-CM

## 2019-02-21 DIAGNOSIS — D509 Iron deficiency anemia, unspecified: Secondary | ICD-10-CM

## 2019-02-21 DIAGNOSIS — N183 Chronic kidney disease, stage 3 unspecified: Secondary | ICD-10-CM

## 2019-02-21 LAB — CBC WITH DIFFERENTIAL/PLATELET
Abs Immature Granulocytes: 0.01 10*3/uL (ref 0.00–0.07)
Basophils Absolute: 0 10*3/uL (ref 0.0–0.1)
Basophils Relative: 1 %
Eosinophils Absolute: 0.2 10*3/uL (ref 0.0–0.5)
Eosinophils Relative: 6 %
HCT: 30.2 % — ABNORMAL LOW (ref 39.0–52.0)
Hemoglobin: 9.6 g/dL — ABNORMAL LOW (ref 13.0–17.0)
Immature Granulocytes: 0 %
Lymphocytes Relative: 24 %
Lymphs Abs: 0.9 10*3/uL (ref 0.7–4.0)
MCH: 31.2 pg (ref 26.0–34.0)
MCHC: 31.8 g/dL (ref 30.0–36.0)
MCV: 98.1 fL (ref 80.0–100.0)
Monocytes Absolute: 0.6 10*3/uL (ref 0.1–1.0)
Monocytes Relative: 15 %
Neutro Abs: 2.1 10*3/uL (ref 1.7–7.7)
Neutrophils Relative %: 54 %
Platelets: 179 10*3/uL (ref 150–400)
RBC: 3.08 MIL/uL — ABNORMAL LOW (ref 4.22–5.81)
RDW: 13.6 % (ref 11.5–15.5)
WBC: 3.9 10*3/uL — ABNORMAL LOW (ref 4.0–10.5)
nRBC: 0 % (ref 0.0–0.2)

## 2019-02-21 LAB — IRON AND TIBC
Iron: 68 ug/dL (ref 45–182)
Saturation Ratios: 20 % (ref 17.9–39.5)
TIBC: 334 ug/dL (ref 250–450)
UIBC: 266 ug/dL

## 2019-02-21 LAB — COMPREHENSIVE METABOLIC PANEL
ALT: 20 U/L (ref 0–44)
AST: 23 U/L (ref 15–41)
Albumin: 4.3 g/dL (ref 3.5–5.0)
Alkaline Phosphatase: 51 U/L (ref 38–126)
Anion gap: 8 (ref 5–15)
BUN: 45 mg/dL — ABNORMAL HIGH (ref 8–23)
CO2: 21 mmol/L — ABNORMAL LOW (ref 22–32)
Calcium: 9 mg/dL (ref 8.9–10.3)
Chloride: 110 mmol/L (ref 98–111)
Creatinine, Ser: 2.05 mg/dL — ABNORMAL HIGH (ref 0.61–1.24)
GFR calc Af Amer: 37 mL/min — ABNORMAL LOW (ref >60)
GFR calc non Af Amer: 32 mL/min — ABNORMAL LOW (ref >60)
Glucose, Bld: 76 mg/dL (ref 70–99)
Potassium: 4.3 mmol/L (ref 3.5–5.1)
Sodium: 139 mmol/L (ref 135–145)
Total Bilirubin: 0.4 mg/dL (ref 0.3–1.2)
Total Protein: 7.2 g/dL (ref 6.5–8.1)

## 2019-02-21 LAB — FERRITIN: Ferritin: 73 ng/mL (ref 24–336)

## 2019-02-21 NOTE — Progress Notes (Signed)
[facility]  [address] [place], [place] [address] Phone: [phone]  Fax: [phone]   Clinic Day:  02/22/2019  Referring physician: [name], MD  Chief Complaint: Joshua Cherry is a 68 y.o. male with stage III chronic kidney disease and anemia who is seen for 9 month assessment.  HPI: The patient was last seen in the hematology clinic on 05/18/2018. At that time, he denied any complaint.  He had lost 8 pounds.  Exam was unremarkable.  Creatinine was 2.37 on 05/10/2018.  Labs followed: 05/31/2018: WBC 6,700, hemoglobin 9.0, hematocrit 27.7, MCV 98, platelets 207,000. Creatinine 2.20, BUN 60, potassium 5.4.  11/12/2018: WBC 5,100, hemoglobin 10.4, hematocrit 31.7, MCV 96, platelets 214,000. Creatinine 1.88, BUN 44. Iron saturation 24%, TIBC 321. Ferritin 151. 02/21/2019: WBC 3,900, hemoglobin 9.6, hematocrit 30.2, MCV 98.1, platelets 179,000. Creatinine 2.05, BUN 45. Iron saturation 20%, TIBC 321. Ferritin 73.  During the interim, he has been doing "fine." He denies any concerns or symptoms. Weight is up 15 lbs in the clinic today. He is managing his diabetes.   He is no longer getting B-12 shot at [place]. He was started oral B-12 per Dr Ellison Hughs on 10/18/2018. He is no longer getting [facility] labs after going on onto Medicare.  He is followed in nephrology by Dr. Smith Mince at [facility].    Past Medical History:  Diagnosis Date  . Anemia   . Chickenpox   . Chronic kidney disease   . Diabetes mellitus without complication ([place])   . GERD (gastroesophageal reflux disease)   . Heart murmur    followed by PCP  . Hypertension   . Prostate enlargement   . Torn meniscus    right    Past Surgical History:  Procedure Laterality Date  . CATARACT EXTRACTION W/PHACO Left 09/08/2016   Procedure: CATARACT EXTRACTION PHACO AND INTRAOCULAR LENS PLACEMENT (IOC);  Surgeon: Leandrew Koyanagi, MD;  Location: [place];  Service:  Ophthalmology;  Laterality: Left;  DIABETIC left  . CATARACT EXTRACTION W/PHACO Right 09/29/2016   Procedure: CATARACT EXTRACTION PHACO AND INTRAOCULAR LENS PLACEMENT ([place])  right eye;  Surgeon: Leandrew Koyanagi, MD;  Location: [place];  Service: Ophthalmology;  Laterality: Right;  Diabetic - oral meds Right Eye IVA Topical  . COLONOSCOPY    . COLONOSCOPY    . ESOPHAGOGASTRODUODENOSCOPY    . ESOPHAGOGASTRODUODENOSCOPY      Family History  Problem Relation Age of Onset  . Diabetes Mother   . Hypertension Mother   . Colon cancer Mother   . Hyperlipidemia Mother   . CAD Mother   . Glaucoma Mother   . Heart disease Mother   . Heart attack Mother   . Stroke Mother   . Diabetes Father   . Alzheimer's disease Father   . Hypertension Father   . Hyperlipidemia Father   . Hip fracture Father   . Diabetes Sister   . Glaucoma Sister   . Cancer Maternal Grandmother     Social History:  reports that he has never smoked. He has never used smokeless tobacco. He reports that he does not drink alcohol or use drugs. 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 The patient is alone today.  Allergies: No Known Allergies  Current Medications: Current Outpatient Medications  Medication Sig Dispense Refill  . aspirin EC 81 MG tablet Take 81 mg by mouth daily.     . B Complex-C (B COMPLEX-VITAMIN  C) CAPS Take by mouth.    . carvedilol (COREG) 3.125 MG tablet Take 3.125 mg by mouth 2 (two) times daily with a meal.     . chlorthalidone (HYGROTON) 25 MG tablet Take 25 mg by mouth daily.    . Cholecalciferol (VITAMIN D3) 2000 UNITS capsule Take 2,000 Units by mouth daily.     . finasteride (PROSCAR) 5 MG tablet Take 5 mg by mouth daily.     Marland Kitchen glipiZIDE (GLUCOTROL) 10 MG tablet Take by mouth. Take 1 tablet (10 mg total) by mouth 2 (two) times daily before meals    . lisinopril (PRINIVIL,ZESTRIL) 40 MG tablet Take 40 mg by mouth daily.    Marland Kitchen  lovastatin (MEVACOR) 20 MG tablet Take 40 mg by mouth daily at 6 PM.     . omeprazole (PRILOSEC) 20 MG capsule Take 20 mg by mouth daily.     Glory Rosebush DELICA LANCETS 42V MISC     . pioglitazone (ACTOS) 45 MG tablet Take 45 mg by mouth daily.     . sitaGLIPtin (JANUVIA) 25 MG tablet Take 50 mg by mouth daily.     Marland Kitchen spironolactone (ALDACTONE) 25 MG tablet Take 0.5 tablets by mouth daily.    . tamsulosin (FLOMAX) 0.4 MG CAPS capsule Take 0.4 mg by mouth daily after breakfast.     . cyanocobalamin (,VITAMIN B-12,) 1000 MCG/ML injection ADMINISTER 1 ML INTRAMUSCULARLY ONCE A MONTH     No current facility-administered medications for this visit.     Review of Systems  Constitutional: Negative for chills, diaphoresis, fever, malaise/fatigue and weight loss (Up 15lbs).       Feels "fine".  HENT: Negative.  Negative for congestion, hearing loss, sinus pain and sore throat.   Eyes: Negative.  Negative for blurred vision.  Respiratory: Negative.  Negative for cough, shortness of breath and wheezing.   Cardiovascular: Negative.  Negative for chest pain, palpitations, claudication, leg swelling and PND.  Gastrointestinal: Negative.  Negative for abdominal pain, blood in stool, constipation, diarrhea, melena, nausea and vomiting.  Genitourinary: Negative.  Negative for dysuria, frequency, hematuria and urgency.  Musculoskeletal: Negative.  Negative for back pain, joint pain and myalgias.  Skin: Negative.  Negative for rash.  Neurological: Negative.  Negative for dizziness, tingling, sensory change, weakness and headaches.  Endo/Heme/Allergies: Does not bruise/bleed easily.       Diabetes.  Psychiatric/Behavioral: Negative.  Negative for depression, memory loss and substance abuse. The patient is not nervous/anxious and does not have insomnia.   All other systems reviewed and are negative.  Performance status (ECOG): 0  Vitals: Blood pressure (!) 146/73, pulse 62, temperature 97.8 F (36.6 C),  resp. rate 18, height 6' (1.829 m), weight 190 lb 2.4 oz (86.3 kg), SpO2 100 %.   Physical Exam  Constitutional: He is oriented to person, place, and time. He appears well-developed and well-nourished. No distress.  HENT:  Head: Normocephalic and atraumatic.  Mouth/Throat: Oropharynx is clear and moist. No oropharyngeal exudate.  Short gray hair. Mask.  Eyes: Pupils are equal, round, and reactive to light. Conjunctivae and EOM are normal. No scleral icterus.  Blue eyes.  Neck: Neck supple.  Cardiovascular: Normal rate, regular rhythm and normal heart sounds.  No murmur heard. Pulmonary/Chest: Effort normal and breath sounds normal. No respiratory distress. He has no wheezes.  Abdominal: Soft. Bowel sounds are normal. He exhibits no distension. There is no abdominal tenderness.  Musculoskeletal: Normal range of motion.        General:  No edema.  Lymphadenopathy:    He has no cervical adenopathy.    He has no axillary adenopathy.       Right: No supraclavicular adenopathy present.       Left: No supraclavicular adenopathy present.  Neurological: He is alert and oriented to person, place, and time.  Skin: Skin is warm and dry. He is not diaphoretic. No erythema.  Psychiatric: He has a normal mood and affect. His behavior is normal. Judgment and thought content normal.  Nursing note and vitals reviewed.   Orders Only on 02/21/2019  Component Date Value Ref Range Status  . Sodium 02/21/2019 139  135 - 145 mmol/L Final  . Potassium 02/21/2019 4.3  3.5 - 5.1 mmol/L Final  . Chloride 02/21/2019 110  98 - 111 mmol/L Final  . CO2 02/21/2019 21* 22 - 32 mmol/L Final  . Glucose, Bld 02/21/2019 76  70 - 99 mg/dL Final  . BUN 02/21/2019 45* 8 - 23 mg/dL Final  . Creatinine, Ser 02/21/2019 2.05* 0.61 - 1.24 mg/dL Final  . Calcium 02/21/2019 9.0  8.9 - 10.3 mg/dL Final  . Total Protein 02/21/2019 7.2  6.5 - 8.1 g/dL Final  . Albumin 02/21/2019 4.3  3.5 - 5.0 g/dL Final  . AST 02/21/2019 23   15 - 41 U/L Final  . ALT 02/21/2019 20  0 - 44 U/L Final  . Alkaline Phosphatase 02/21/2019 51  38 - 126 U/L Final  . Total Bilirubin 02/21/2019 0.4  0.3 - 1.2 mg/dL Final  . GFR calc non Af Amer 02/21/2019 32* >60 mL/min Final  . GFR calc Af Amer 02/21/2019 37* >60 mL/min Final  . Anion gap 02/21/2019 8  5 - 15 Final   Performed at [facility], [address], [place], [place] [address]  . Iron 02/21/2019 68  45 - 182 ug/dL Final  . TIBC 02/21/2019 334  250 - 450 ug/dL Final  . Saturation Ratios 02/21/2019 20  17.9 - 39.5 % Final  . UIBC 02/21/2019 266  ug/dL Final   Performed at [facility], [address], [place], [place] [address]  . Ferritin 02/21/2019 73  24 - 336 ng/mL Final   Performed at [facility], [address] [place], [place] [address]  . WBC 02/21/2019 3.9* 4.0 - 10.5 K/uL Final  . RBC 02/21/2019 3.08* 4.22 - 5.81 MIL/uL Final  . Hemoglobin 02/21/2019 9.6* 13.0 - 17.0 g/dL Final  . HCT 02/21/2019 30.2* 39.0 - 52.0 % Final  . MCV 02/21/2019 98.1  80.0 - 100.0 fL Final  . MCH 02/21/2019 31.2  26.0 - 34.0 pg Final  . MCHC 02/21/2019 31.8  30.0 - 36.0 g/dL Final  . RDW 02/21/2019 13.6  11.5 - 15.5 % Final  . Platelets 02/21/2019 179  150 - 400 K/uL Final  . nRBC 02/21/2019 0.0  0.0 - 0.2 % Final  . Neutrophils Relative % 02/21/2019 54  % Final  . Neutro Abs 02/21/2019 2.1  1.7 - 7.7 K/uL Final  . Lymphocytes Relative 02/21/2019 24  % Final  . Lymphs Abs 02/21/2019 0.9  0.7 - 4.0 K/uL Final  . Monocytes Relative 02/21/2019 15  % Final  . Monocytes Absolute 02/21/2019 0.6  0.1 - 1.0 K/uL Final  . Eosinophils Relative 02/21/2019 6  % Final  . Eosinophils Absolute 02/21/2019 0.2  0.0 - 0.5 K/uL Final  . Basophils Relative 02/21/2019 1  % Final  . Basophils Absolute 02/21/2019 0.0  0.0 - 0.1  K/uL Final  . Immature Granulocytes 02/21/2019 0  % Final  . Abs Immature Granulocytes 02/21/2019 0.01  0.00 - 0.07 K/uL Final   Performed at [facility], [address] [place], [place] [address]    Assessment:  Joshua Cherry is a 68 y.o. male with stage III chronic kidney disease and anemia.  He has chronic kidney disease (creatinine clearance 37-41 ml/min). Diet is good.  He denies any melena, hematochezia, or hematuria.  He denies any pica or restless legs.  EGD and colonoscopy noted only reflux in 2015. Guaiac cards were negative. He never had a capsule study.   He was diagnosed with B12 deficiency in 2015.  He receives  monthy injections at [facility]. He was a regular blood donor until approximately 2013-2014 when he was told his "iron was low".   Work-up in 10/2014 revealed a hematocrit of 31.8, hemoglobin 10.3, MCV 95, with a normal WBC and platelet count. B12 and folate were normal. SPEP was normal on 07/09/2014. Iron studies were normal with a saturation of 18% and a TIBC of 297. Ferritin was 72.  Erythropoietin was 10.6.  Antinuclear antibody direct was positive with an anti-DNA double-stranded 24 (0-9).  Sedimentation rate was 3.  Haptoglobin was 181. Coombs was negative.   Work-up on 08/10/2017 revealed a ferritin of 116.  Iron saturation was 14% with a TIBC of 372.  BUN was 59 with a Cr 1.98 (CrCl 33-38 ml/min).  Epo level was 8.8.  SPEP revealed no monoclonal protein.  Retic was 1%.  Urinalysis revealed no hematuria.  Symptomatically, he feels fine.  Exam is unremarkable.  Plan: 1.   Review labs from 02/21/2019. 2.   Anemia of chronic renal disease             Clinically doing well.  Counts stable.  Hematocrit 30.2.  Hemoglobin 9.6.  MCV 98.1.             Ferritin 73. 3.   B12 deficiency             B12 injections at [facility] were discontinued.   Patient is on oral B-12.   Check B12 level in 3 months.              Check folate periodically. 4.   RTC in 3 months for labs (CBC with diff, ferritin, B12, folate). 5.   RTC in 6 months for MD assessment, labs (CBC with diff, ferritin, Cr).  I discussed the  assessment and treatment plan with the patient.  The patient was provided an opportunity to ask questions and all were answered.  The patient agreed with the plan and demonstrated an understanding of the instructions.  The patient was advised to call back if the symptoms worsen or if the condition fails to improve as anticipated.   Lequita Asal, MD, PhD    02/22/2019, 10:30 AM  I, Molly Dorshimer, am acting as Education administrator for [employer]. Mike Gip, MD, PhD.  I, Melissa C. Mike Gip, MD, have reviewed the above documentation for accuracy and completeness, and I agree with the above.

## 2019-02-22 ENCOUNTER — Encounter: Payer: Self-pay | Admitting: Hematology and Oncology

## 2019-02-22 ENCOUNTER — Inpatient Hospital Stay: Payer: Medicare HMO

## 2019-02-22 ENCOUNTER — Inpatient Hospital Stay (HOSPITAL_BASED_OUTPATIENT_CLINIC_OR_DEPARTMENT_OTHER): Payer: Medicare HMO | Admitting: Hematology and Oncology

## 2019-02-22 VITALS — BP 146/73 | HR 62 | Temp 97.8°F | Resp 18 | Ht 72.0 in | Wt 190.1 lb

## 2019-02-22 DIAGNOSIS — N183 Chronic kidney disease, stage 3 (moderate): Secondary | ICD-10-CM

## 2019-02-22 DIAGNOSIS — D631 Anemia in chronic kidney disease: Secondary | ICD-10-CM

## 2019-02-22 DIAGNOSIS — E538 Deficiency of other specified B group vitamins: Secondary | ICD-10-CM | POA: Diagnosis not present

## 2019-02-22 NOTE — Progress Notes (Signed)
No new changes noted today 

## 2019-05-24 ENCOUNTER — Other Ambulatory Visit: Payer: Self-pay

## 2019-05-24 ENCOUNTER — Inpatient Hospital Stay: Payer: Medicare HMO | Attending: Hematology and Oncology

## 2019-05-24 DIAGNOSIS — E538 Deficiency of other specified B group vitamins: Secondary | ICD-10-CM | POA: Diagnosis not present

## 2019-05-24 DIAGNOSIS — N183 Chronic kidney disease, stage 3 unspecified: Secondary | ICD-10-CM

## 2019-05-24 DIAGNOSIS — D631 Anemia in chronic kidney disease: Secondary | ICD-10-CM | POA: Insufficient documentation

## 2019-05-24 LAB — CBC WITH DIFFERENTIAL/PLATELET
Abs Immature Granulocytes: 0.03 10*3/uL (ref 0.00–0.07)
Basophils Absolute: 0 10*3/uL (ref 0.0–0.1)
Basophils Relative: 0 %
Eosinophils Absolute: 0.4 10*3/uL (ref 0.0–0.5)
Eosinophils Relative: 8 %
HCT: 29.1 % — ABNORMAL LOW (ref 39.0–52.0)
Hemoglobin: 9.5 g/dL — ABNORMAL LOW (ref 13.0–17.0)
Immature Granulocytes: 1 %
Lymphocytes Relative: 19 %
Lymphs Abs: 1 10*3/uL (ref 0.7–4.0)
MCH: 32.3 pg (ref 26.0–34.0)
MCHC: 32.6 g/dL (ref 30.0–36.0)
MCV: 99 fL (ref 80.0–100.0)
Monocytes Absolute: 0.6 10*3/uL (ref 0.1–1.0)
Monocytes Relative: 11 %
Neutro Abs: 3.3 10*3/uL (ref 1.7–7.7)
Neutrophils Relative %: 61 %
Platelets: 202 10*3/uL (ref 150–400)
RBC: 2.94 MIL/uL — ABNORMAL LOW (ref 4.22–5.81)
RDW: 13 % (ref 11.5–15.5)
WBC: 5.3 10*3/uL (ref 4.0–10.5)
nRBC: 0 % (ref 0.0–0.2)

## 2019-05-24 LAB — VITAMIN B12: Vitamin B-12: 884 pg/mL (ref 180–914)

## 2019-05-24 LAB — FOLATE: Folate: 36 ng/mL (ref >5.9)

## 2019-05-24 LAB — FERRITIN: Ferritin: 100 ng/mL (ref 24–336)

## 2019-06-20 ENCOUNTER — Other Ambulatory Visit: Payer: Self-pay

## 2019-06-20 ENCOUNTER — Ambulatory Visit: Payer: Self-pay

## 2019-06-20 DIAGNOSIS — Z23 Encounter for immunization: Secondary | ICD-10-CM

## 2019-08-15 ENCOUNTER — Other Ambulatory Visit: Payer: Self-pay

## 2019-08-15 ENCOUNTER — Inpatient Hospital Stay: Payer: Medicare HMO | Attending: Hematology and Oncology

## 2019-08-15 DIAGNOSIS — Z800 Family history of malignant neoplasm of digestive organs: Secondary | ICD-10-CM | POA: Insufficient documentation

## 2019-08-15 DIAGNOSIS — N400 Enlarged prostate without lower urinary tract symptoms: Secondary | ICD-10-CM | POA: Diagnosis not present

## 2019-08-15 DIAGNOSIS — I10 Essential (primary) hypertension: Secondary | ICD-10-CM | POA: Insufficient documentation

## 2019-08-15 DIAGNOSIS — Z8249 Family history of ischemic heart disease and other diseases of the circulatory system: Secondary | ICD-10-CM | POA: Insufficient documentation

## 2019-08-15 DIAGNOSIS — E119 Type 2 diabetes mellitus without complications: Secondary | ICD-10-CM | POA: Diagnosis not present

## 2019-08-15 DIAGNOSIS — D631 Anemia in chronic kidney disease: Secondary | ICD-10-CM | POA: Diagnosis present

## 2019-08-15 DIAGNOSIS — Z7982 Long term (current) use of aspirin: Secondary | ICD-10-CM | POA: Insufficient documentation

## 2019-08-15 DIAGNOSIS — Z79899 Other long term (current) drug therapy: Secondary | ICD-10-CM | POA: Insufficient documentation

## 2019-08-15 DIAGNOSIS — Z7984 Long term (current) use of oral hypoglycemic drugs: Secondary | ICD-10-CM | POA: Insufficient documentation

## 2019-08-15 DIAGNOSIS — Z833 Family history of diabetes mellitus: Secondary | ICD-10-CM | POA: Insufficient documentation

## 2019-08-15 DIAGNOSIS — E538 Deficiency of other specified B group vitamins: Secondary | ICD-10-CM | POA: Diagnosis not present

## 2019-08-15 DIAGNOSIS — N183 Chronic kidney disease, stage 3 unspecified: Secondary | ICD-10-CM | POA: Insufficient documentation

## 2019-08-15 DIAGNOSIS — R011 Cardiac murmur, unspecified: Secondary | ICD-10-CM | POA: Diagnosis not present

## 2019-08-15 LAB — CBC WITH DIFFERENTIAL/PLATELET
Abs Immature Granulocytes: 0.02 10*3/uL (ref 0.00–0.07)
Basophils Absolute: 0 10*3/uL (ref 0.0–0.1)
Basophils Relative: 0 %
Eosinophils Absolute: 0.3 10*3/uL (ref 0.0–0.5)
Eosinophils Relative: 7 %
HCT: 31 % — ABNORMAL LOW (ref 39.0–52.0)
Hemoglobin: 9.9 g/dL — ABNORMAL LOW (ref 13.0–17.0)
Immature Granulocytes: 0 %
Lymphocytes Relative: 20 %
Lymphs Abs: 1 10*3/uL (ref 0.7–4.0)
MCH: 31.8 pg (ref 26.0–34.0)
MCHC: 31.9 g/dL (ref 30.0–36.0)
MCV: 99.7 fL (ref 80.0–100.0)
Monocytes Absolute: 0.6 10*3/uL (ref 0.1–1.0)
Monocytes Relative: 12 %
Neutro Abs: 3.1 10*3/uL (ref 1.7–7.7)
Neutrophils Relative %: 61 %
Platelets: 197 10*3/uL (ref 150–400)
RBC: 3.11 MIL/uL — ABNORMAL LOW (ref 4.22–5.81)
RDW: 12.8 % (ref 11.5–15.5)
WBC: 5.1 10*3/uL (ref 4.0–10.5)
nRBC: 0 % (ref 0.0–0.2)

## 2019-08-15 LAB — FERRITIN: Ferritin: 82 ng/mL (ref 24–336)

## 2019-08-16 ENCOUNTER — Encounter: Payer: Self-pay | Admitting: Nurse Practitioner

## 2019-08-16 ENCOUNTER — Inpatient Hospital Stay: Payer: Medicare HMO | Admitting: Nurse Practitioner

## 2019-08-16 VITALS — BP 155/58 | HR 65 | Temp 97.6°F | Resp 16 | Wt 185.2 lb

## 2019-08-16 DIAGNOSIS — E538 Deficiency of other specified B group vitamins: Secondary | ICD-10-CM | POA: Diagnosis not present

## 2019-08-16 DIAGNOSIS — N183 Chronic kidney disease, stage 3 unspecified: Secondary | ICD-10-CM

## 2019-08-16 DIAGNOSIS — D631 Anemia in chronic kidney disease: Secondary | ICD-10-CM | POA: Diagnosis not present

## 2019-08-16 NOTE — Progress Notes (Signed)
[facility]  [address], [address] [place], [place] [address] Phone: [phone]  Fax: [phone]   Clinic Day:  08/16/2019  Referring physician: [name], MD  Chief Complaint: Joshua Cherry is a 68 y.o. male with stage IIIb/4 chronic kidney disease and anemia, who is seen for 29-month reevaluation  HPI: Patient last saw Dr. Mike Gip on 02/22/2019.  Hematocrit was 30.2, hemoglobin was 9.6, MCV was 98.1, ferritin was 73.  Creatinine was 2.05.  He continued oral B12.  In the interim, he had repeat labs in September.  Ferritin was 100.  Folate was 36.  B12 was 884.    Today, he reports feeling fine and denies.  Specifically denies fatigue, shortness of breath, paleness, weakness, aches or pains, chest pain, dizziness, fainting, irregular heartbeat, headaches, sleep problems, trouble concentrating, or pica.  He continues to be followed by Dr. Smith Mince at [facility] nephrology.  He was last seen by nephrology in August 2020. I reviewed Dr. Murrell Converse last note in preparation for this appointment. He has a colonoscopy planned for January with Dr. Alice Reichert.  Continues to be followed by Dr. Honor Junes with endocrinology for history of type 2 diabetes.  He no longer gets B12 shots and continues oral B12 per his PCP.   Past medical, surgical, and social history reviewed and updated as below today.  Additionally, allergies and current medications were reviewed and updated as below.  Past Medical History:  Diagnosis Date  . Anemia   . Chickenpox   . Chronic kidney disease   . Diabetes mellitus without complication ([place])   . GERD (gastroesophageal reflux disease)   . Heart murmur    followed by PCP  . Hypertension   . Prostate enlargement   . Torn meniscus    right    Past Surgical History:  Procedure Laterality Date  . CATARACT EXTRACTION W/PHACO Left 09/08/2016   Procedure: CATARACT EXTRACTION PHACO AND INTRAOCULAR LENS PLACEMENT (IOC);  Surgeon: Leandrew Koyanagi,  MD;  Location: [place];  Service: Ophthalmology;  Laterality: Left;  DIABETIC left  . CATARACT EXTRACTION W/PHACO Right 09/29/2016   Procedure: CATARACT EXTRACTION PHACO AND INTRAOCULAR LENS PLACEMENT ([place])  right eye;  Surgeon: Leandrew Koyanagi, MD;  Location: [place];  Service: Ophthalmology;  Laterality: Right;  Diabetic - oral meds Right Eye IVA Topical  . COLONOSCOPY    . COLONOSCOPY    . ESOPHAGOGASTRODUODENOSCOPY    . ESOPHAGOGASTRODUODENOSCOPY      Family History  Problem Relation Age of Onset  . Diabetes Mother   . Hypertension Mother   . Colon cancer Mother   . Hyperlipidemia Mother   . CAD Mother   . Glaucoma Mother   . Heart disease Mother   . Heart attack Mother   . Stroke Mother   . Diabetes Father   . Alzheimer's disease Father   . Hypertension Father   . Hyperlipidemia Father   . Hip fracture Father   . Diabetes Sister   . Glaucoma Sister   . Cancer Maternal Grandmother     Social History:  reports that he has never smoked. He has never used smokeless tobacco. He reports that he does not drink alcohol or use drugs. 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   Allergies: No Known Allergies  Current Medications: Current Outpatient Medications  Medication Sig Dispense Refill  . aspirin EC 81 MG tablet Take 81 mg by mouth daily.     Marland Kitchen  B Complex-C (B COMPLEX-VITAMIN C) CAPS Take by mouth.    . carvedilol (COREG) 3.125 MG tablet Take 3.125 mg by mouth 2 (two) times daily with a meal.     . chlorthalidone (HYGROTON) 25 MG tablet Take 25 mg by mouth daily.    . Cholecalciferol (VITAMIN D3) 2000 UNITS capsule Take 2,000 Units by mouth daily.     . cyanocobalamin (,VITAMIN B-12,) 1000 MCG/ML injection ADMINISTER 1 ML INTRAMUSCULARLY ONCE A MONTH    . finasteride (PROSCAR) 5 MG tablet Take 5 mg by mouth daily.     Marland Kitchen glipiZIDE (GLUCOTROL) 10 MG tablet Take by mouth. Take 1 tablet (10 mg total) by  mouth 2 (two) times daily before meals    . lisinopril (PRINIVIL,ZESTRIL) 40 MG tablet Take 40 mg by mouth daily.    Marland Kitchen lovastatin (MEVACOR) 20 MG tablet Take 40 mg by mouth daily at 6 PM.     . omeprazole (PRILOSEC) 20 MG capsule Take 20 mg by mouth daily.     Glory Rosebush DELICA LANCETS [id number] MISC     . pioglitazone (ACTOS) 45 MG tablet Take 45 mg by mouth daily.     . sitaGLIPtin (JANUVIA) 25 MG tablet Take 50 mg by mouth daily.     Marland Kitchen spironolactone (ALDACTONE) 25 MG tablet Take 0.5 tablets by mouth daily.    . tamsulosin (FLOMAX) 0.4 MG CAPS capsule Take 0.4 mg by mouth daily after breakfast.      No current facility-administered medications for this visit.    Review of Systems  Constitutional: Negative for chills, diaphoresis, fever, malaise/fatigue and weight loss (Up 15lbs).       Feels "fine".  HENT: Negative.  Negative for congestion, hearing loss, sinus pain and sore throat.   Eyes: Negative.  Negative for blurred vision.  Respiratory: Negative.  Negative for cough, shortness of breath and wheezing.   Cardiovascular: Positive for leg swelling (Thought to be secondary to Actos). Negative for chest pain, palpitations, claudication and PND.       Hypertension  Gastrointestinal: Negative.  Negative for abdominal pain, blood in stool, constipation, diarrhea, melena, nausea and vomiting.  Genitourinary: Negative.  Negative for dysuria, frequency, hematuria and urgency.  Musculoskeletal: Negative.  Negative for back pain, joint pain and myalgias.  Skin: Negative.  Negative for rash.  Neurological: Negative.  Negative for dizziness, tingling, sensory change, weakness and headaches.  Endo/Heme/Allergies: Does not bruise/bleed easily.       Diabetes.  Psychiatric/Behavioral: Negative.  Negative for depression, memory loss and substance abuse. The patient is not nervous/anxious and does not have insomnia.   All other systems reviewed and are negative.  Performance status (ECOG):  0  Vitals: There were no vitals filed for this visit. There is no height or weight on file to calculate BMI.   Physical Exam  Constitutional: He is oriented to person, place, and time. He appears well-developed and well-nourished. No distress.  Unaccompanied.  Wearing mask.  HENT:  Head: Normocephalic and atraumatic.  Mouth/Throat: Oropharynx is clear and moist. No oropharyngeal exudate.  Short gray hair. Mask.  Eyes: Conjunctivae are normal. No scleral icterus.  Blue eyes.  Cardiovascular: Normal rate and regular rhythm.  Murmur (systolic murmur grade 2/6) heard. Pulmonary/Chest: Effort normal and breath sounds normal. No respiratory distress. He has no wheezes.  Abdominal: Soft. Bowel sounds are normal. He exhibits no distension. There is no abdominal tenderness.  Musculoskeletal:        General: Edema (BLE 1+) present. Normal range  of motion.     Cervical back: Neck supple.  Lymphadenopathy:    He has no cervical adenopathy.    He has no axillary adenopathy.       Right: No supraclavicular adenopathy present.       Left: No supraclavicular adenopathy present.  Neurological: He is alert and oriented to person, place, and time.  Skin: Skin is warm and dry. He is not diaphoretic. No erythema. No pallor.  Psychiatric: He has a normal mood and affect. His behavior is normal. Judgment and thought content normal.  Nursing note and vitals reviewed.   Appointment on 08/15/2019  Component Date Value Ref Range Status  . Ferritin 08/15/2019 82  24 - 336 ng/mL Final   Performed at [facility], [address] [place], [place] [address]  . WBC 08/15/2019 5.1  4.0 - 10.5 K/uL Final  . RBC 08/15/2019 3.11* 4.22 - 5.81 MIL/uL Final  . Hemoglobin 08/15/2019 9.9* 13.0 - 17.0 g/dL Final  . HCT 08/15/2019 31.0* 39.0 - 52.0 % Final  . MCV 08/15/2019 99.7  80.0 - 100.0 fL Final  . MCH 08/15/2019 31.8  26.0 - 34.0 pg Final  . MCHC 08/15/2019 31.9  30.0 - 36.0 g/dL Final  . RDW  08/15/2019 12.8  11.5 - 15.5 % Final  . Platelets 08/15/2019 197  150 - 400 K/uL Final  . nRBC 08/15/2019 0.0  0.0 - 0.2 % Final  . Neutrophils Relative % 08/15/2019 61  % Final  . Neutro Abs 08/15/2019 3.1  1.7 - 7.7 K/uL Final  . Lymphocytes Relative 08/15/2019 20  % Final  . Lymphs Abs 08/15/2019 1.0  0.7 - 4.0 K/uL Final  . Monocytes Relative 08/15/2019 12  % Final  . Monocytes Absolute 08/15/2019 0.6  0.1 - 1.0 K/uL Final  . Eosinophils Relative 08/15/2019 7  % Final  . Eosinophils Absolute 08/15/2019 0.3  0.0 - 0.5 K/uL Final  . Basophils Relative 08/15/2019 0  % Final  . Basophils Absolute 08/15/2019 0.0  0.0 - 0.1 K/uL Final  . Immature Granulocytes 08/15/2019 0  % Final  . Abs Immature Granulocytes 08/15/2019 0.02  0.00 - 0.07 K/uL Final   Performed at [facility], [address], [place], [place] [address]    Assessment:  TIMOUTHY RITTGERS is a 68 y.o. male with stage III chronic kidney disease and anemia.  He has chronic kidney disease (creatinine clearance 37-41 ml/min).  Diet is good.  He denies melena, hematochezia, or hematuria.  He denies pica or restless legs.  EGD and colonoscopy noted only reflux in 2015. Guaiac cards were negative. He never had a capsule study.   He was diagnosed with B12 deficiency in 2015.    He previously received monthly injections at [facility] per his PCP.  He was a regular blood donor until approximately 2013-14 when he was told that his "iron was low". B12 injections were discontinued in 2020 and he has been stable on oral B12 supplementation.  Work-up in 10/2014 revealed a hematocrit of 31.8, hemoglobin 10.3, MCV 95, with a normal WBC and platelet count. B12 and folate were normal. SPEP was normal on 07/09/2014. Iron studies were normal with a saturation of 18% and a TIBC of 297. Ferritin was 72.  Erythropoietin was 10.6.  Antinuclear antibody direct was positive with an anti-DNA double-stranded 24 (0-9).  Sedimentation rate was 3.   Haptoglobin was 181. Coombs was negative.   Work-up on 08/10/2017 revealed a ferritin of 116.  Iron saturation was 14% with a TIBC of 372.  BUN was 59 with a Cr 1.98 (CrCl 33-38 ml/min).  Epo level was 8.8.  SPEP revealed no monoclonal protein.  Retic was 1%.  Urinalysis revealed no hematuria.  Symptomatically, patient is stable and exam is unremarkable.  Plan: 1.   Reviewed labs from 05/24/2019 and 08/15/2019. 2.   Anemia of chronic renal disease             Clinically stable.  Counts stable to slightly improved as below  Hematocrit 31  hemoglobin 9.9 MCV 99.7  Ferritin 82.  Previously 100  Not currently on supplemental oral iron and he has not required IV iron.  Labs overall stable.  No need for iron today. 3.   B12 deficiency             On oral B12.  Previously received injections.  B12 was normal at 884 in September.  Folate was normal at 36 in September  Continue oral B12  Plan to recheck B12 & folate at next visit 4. Heart Murmur  Per patient, onset in his teens and unchanged.   Notes reviewed and managed by pcp  Follow up with pcp as scheduled Disposition: RTC in 3 months for labs (cbc w/ diff, ferritin) RTC in 6 months for labs (cbc w/ diff, cmp, iron studies, ferritin, b12, folate), next day see MD  I discussed the assessment and treatment plan with the patient.  The patient was provided an opportunity to ask questions and all were answered.  The patient agreed with the plan and demonstrated an understanding of the instructions.  The patient was advised to call back if the symptoms worsen or if the condition fails to improve as anticipated.  Beckey Rutter, DNP, AGNP-C [place] at [facility] [phone] (clinic)  CC: Dr. Mike Gip

## 2019-08-16 NOTE — Progress Notes (Signed)
Patient here for follow up. Denies any concerns.  

## 2019-09-10 ENCOUNTER — Ambulatory Visit: Admission: RE | Admit: 2019-09-10 | Payer: Medicare HMO | Source: Home / Self Care | Admitting: Internal Medicine

## 2019-09-10 ENCOUNTER — Encounter: Admission: RE | Payer: Self-pay | Source: Home / Self Care

## 2019-09-10 SURGERY — COLONOSCOPY WITH PROPOFOL
Anesthesia: General

## 2019-10-11 ENCOUNTER — Other Ambulatory Visit: Payer: Self-pay

## 2019-10-11 ENCOUNTER — Emergency Department
Admission: EM | Admit: 2019-10-11 | Discharge: 2019-10-11 | Disposition: A | Discharge status: Home / Self Care | Payer: HMO | Attending: Emergency Medicine | Admitting: Emergency Medicine

## 2019-10-11 ENCOUNTER — Encounter: Payer: Self-pay | Admitting: Emergency Medicine

## 2019-10-11 DIAGNOSIS — Z79899 Other long term (current) drug therapy: Secondary | ICD-10-CM | POA: Insufficient documentation

## 2019-10-11 DIAGNOSIS — Z7984 Long term (current) use of oral hypoglycemic drugs: Secondary | ICD-10-CM | POA: Insufficient documentation

## 2019-10-11 DIAGNOSIS — Z7982 Long term (current) use of aspirin: Secondary | ICD-10-CM | POA: Diagnosis not present

## 2019-10-11 DIAGNOSIS — Z711 Person with feared health complaint in whom no diagnosis is made: Secondary | ICD-10-CM

## 2019-10-11 DIAGNOSIS — N183 Chronic kidney disease, stage 3 unspecified: Secondary | ICD-10-CM | POA: Diagnosis not present

## 2019-10-11 DIAGNOSIS — I129 Hypertensive chronic kidney disease with stage 1 through stage 4 chronic kidney disease, or unspecified chronic kidney disease: Secondary | ICD-10-CM | POA: Diagnosis not present

## 2019-10-11 DIAGNOSIS — I10 Essential (primary) hypertension: Secondary | ICD-10-CM | POA: Diagnosis not present

## 2019-10-11 DIAGNOSIS — R799 Abnormal finding of blood chemistry, unspecified: Secondary | ICD-10-CM | POA: Diagnosis not present

## 2019-10-11 DIAGNOSIS — E1122 Type 2 diabetes mellitus with diabetic chronic kidney disease: Secondary | ICD-10-CM | POA: Insufficient documentation

## 2019-10-11 DIAGNOSIS — E875 Hyperkalemia: Secondary | ICD-10-CM | POA: Diagnosis not present

## 2019-10-11 LAB — COMPREHENSIVE METABOLIC PANEL
ALT: 18 U/L (ref 0–44)
AST: 21 U/L (ref 15–41)
Albumin: 4 g/dL (ref 3.5–5.0)
Alkaline Phosphatase: 50 U/L (ref 38–126)
Anion gap: 5 (ref 5–15)
BUN: 47 mg/dL — ABNORMAL HIGH (ref 8–23)
CO2: 26 mmol/L (ref 22–32)
Calcium: 9.1 mg/dL (ref 8.9–10.3)
Chloride: 107 mmol/L (ref 98–111)
Creatinine, Ser: 1.8 mg/dL — ABNORMAL HIGH (ref 0.61–1.24)
GFR calc Af Amer: 43 mL/min — ABNORMAL LOW (ref >60)
GFR calc non Af Amer: 37 mL/min — ABNORMAL LOW (ref >60)
Glucose, Bld: 163 mg/dL — ABNORMAL HIGH (ref 70–99)
Potassium: 4.7 mmol/L (ref 3.5–5.1)
Sodium: 138 mmol/L (ref 135–145)
Total Bilirubin: 0.5 mg/dL (ref 0.3–1.2)
Total Protein: 7.2 g/dL (ref 6.5–8.1)

## 2019-10-11 LAB — CBC
HCT: 30.7 % — ABNORMAL LOW (ref 39.0–52.0)
Hemoglobin: 9.7 g/dL — ABNORMAL LOW (ref 13.0–17.0)
MCH: 31.3 pg (ref 26.0–34.0)
MCHC: 31.6 g/dL (ref 30.0–36.0)
MCV: 99 fL (ref 80.0–100.0)
Platelets: 210 10*3/uL (ref 150–400)
RBC: 3.1 MIL/uL — ABNORMAL LOW (ref 4.22–5.81)
RDW: 12.6 % (ref 11.5–15.5)
WBC: 5 10*3/uL (ref 4.0–10.5)
nRBC: 0 % (ref 0.0–0.2)

## 2019-10-11 NOTE — ED Triage Notes (Signed)
Patient had a routine lab draw today and his pcp told him to come to the ED because his potassium was 6.4.  Patient states he is feeling fine.  Patient has history of kidney disease.  He is not on dialysis.  Patient is in no obvious distress at this time.

## 2019-10-11 NOTE — ED Notes (Signed)
Pt states he went to dr today to have blood work, states he received call from doctor that his potassium was high and to come to the ED.  Pt in NAD at this time.  Pt denies any s/s at this time. Ambulatory to treatment room

## 2019-10-11 NOTE — Discharge Instructions (Addendum)
Follow-up with your regular doctor.  Your potassium level today was 4.1.  Please follow-up with your regular doctor for repeat lab test on Monday for your already scheduled appointment.

## 2019-10-11 NOTE — ED Provider Notes (Signed)
[facility] Emergency Department Provider Note  ____________________________________________   First MD Initiated Contact with Patient 10/11/19 2057     (approximate)  I have reviewed the triage vital signs and the nursing notes.   HISTORY  Chief Complaint Abnormal Lab    HPI Joshua Cherry is a 69 y.o. male presents emergency department stating his doctor told him his potassium was 6.4 he need to come to the emergency department.  He has no complaints.  He denies chest pain, shortness of breath, vomiting or diarrhea.  Does have a history of chronic kidney disease    Past Medical History:  Diagnosis Date  . Anemia   . Chickenpox   . Chronic kidney disease   . Diabetes mellitus without complication ([place])   . GERD (gastroesophageal reflux disease)   . Heart murmur    followed by PCP  . Hypertension   . Prostate enlargement   . Torn meniscus    right    Patient Active Problem List   Diagnosis Date Noted  . B12 deficiency 11/17/2017  . Anemia of chronic kidney failure, stage 3 (moderate) 09/12/2017  . 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 Abnormal kidney function 04/27/2017  . Anemia 02/22/2015  . Diabetes mellitus type 1.5 ([place]) 02/21/2015  . Benign fibroma of prostate 03/19/2014  . Elevated lipids 03/19/2014  . Acid reflux 03/19/2014  . Diabetes mellitus, type 2 ([place]) 03/19/2014  . BP (high blood pressure) 03/19/2014  . Anemia, iron deficiency 03/19/2014  . Benign prostatic hyperplasia 03/19/2014  . DM (diabetes mellitus), type 2 with renal complications ([place]) 123XX123  . GERD (gastroesophageal reflux disease) 03/19/2014  . Hypertension 03/19/2014  . IDA (iron deficiency anemia) 03/19/2014    Past Surgical History:  Procedure Laterality Date  . CATARACT EXTRACTION W/PHACO Left 09/08/2016   Procedure: CATARACT EXTRACTION PHACO AND INTRAOCULAR LENS PLACEMENT (IOC);  Surgeon: Leandrew Koyanagi, MD;  Location: [place];  Service: Ophthalmology;  Laterality: Left;  DIABETIC left  . CATARACT EXTRACTION W/PHACO Right 09/29/2016   Procedure: CATARACT EXTRACTION PHACO AND INTRAOCULAR LENS PLACEMENT ([place])  right eye;  Surgeon: Leandrew Koyanagi, MD;  Location: [place];  Service: Ophthalmology;  Laterality: Right;  Diabetic - oral meds Right Eye IVA Topical  . COLONOSCOPY    . COLONOSCOPY    . ESOPHAGOGASTRODUODENOSCOPY    . ESOPHAGOGASTRODUODENOSCOPY      Prior to Admission medications   Medication Sig Start Date End Date Taking? Authorizing Provider  aspirin EC 81 MG tablet Take 81 mg by mouth daily.     [provider]  B Complex-C (B COMPLEX-VITAMIN C) CAPS Take 1 tablet by mouth daily.     [provider]  carvedilol (COREG) 3.125 MG tablet Take 3.125 mg by mouth 2 (two) times daily with a meal.  06/04/14   [provider]  chlorthalidone (HYGROTON) 25 MG tablet Take 25 mg by mouth daily.    [provider]  Cholecalciferol (VITAMIN D3) 2000 UNITS capsule Take 2,000 Units by mouth daily.     [provider]  finasteride (PROSCAR) 5 MG tablet Take 5 mg by mouth daily.  05/10/14   [provider]  glipiZIDE (GLUCOTROL) 10 MG tablet Take by mouth. Take 1 tablet (10 mg total) by mouth 2 (two) times daily before meals 04/01/14   [provider]  lisinopril (PRINIVIL,ZESTRIL) 40 MG tablet Take 40 mg by mouth daily.    [provider]  lovastatin (  MEVACOR) 40 MG tablet Take 40 mg by mouth at bedtime. 08/01/19   [provider]  omeprazole (PRILOSEC) 20 MG capsule Take 20 mg by mouth daily.  05/10/14   [provider]  ONETOUCH DELICA LANCETS [id number] MISC  06/01/14   [provider]  [facility] ULTRA test strip USE ONCE DAILY AS DIRECTED   WHAT MACHINE  NOTHING SINCE 2018 06/19/19   [provider]  pioglitazone (ACTOS) 45 MG tablet Take 45 mg by mouth daily.  06/04/14   [provider]    sitaGLIPtin (JANUVIA) 25 MG tablet Take 50 mg by mouth daily.  06/20/14   [provider]  spironolactone (ALDACTONE) 25 MG tablet Take 0.5 tablets by mouth daily. 12/21/17 08/16/19  [provider]  tamsulosin (FLOMAX) 0.4 MG CAPS capsule Take 0.4 mg by mouth daily after breakfast.  05/02/14   [provider]  vitamin B-12 (CYANOCOBALAMIN) 1000 MCG tablet Take 1,000 mcg by mouth daily.    [provider]    Allergies Patient has no known allergies.  Family History  Problem Relation Age of Onset  . Diabetes Mother   . Hypertension Mother   . Colon cancer Mother   . Hyperlipidemia Mother   . CAD Mother   . Glaucoma Mother   . Heart disease Mother   . Heart attack Mother   . Stroke Mother   . Diabetes Father   . Alzheimer's disease Father   . Hypertension Father   . Hyperlipidemia Father   . Hip fracture Father   . Diabetes Sister   . Glaucoma Sister   . Cancer Maternal Grandmother     Social History Social History   Tobacco Use  . Smoking status: Never Smoker  . Smokeless tobacco: Never Used  Substance Use Topics  . Alcohol use: No    Comment: former drinker  . Drug use: No    Review of Systems  Constitutional: No fever/chills Eyes: No visual changes. ENT: No sore throat. Respiratory: Denies cough Cardiovascular: Denies chest pain Gastrointestinal: Denies abdominal pain Genitourinary: Negative for dysuria. Musculoskeletal: Negative for back pain. Skin: Negative for rash. Psychiatric: no mood changes,     ____________________________________________   PHYSICAL EXAM:  VITAL SIGNS: ED Triage Vitals  Enc Vitals Group     BP 10/11/19 1730 (!) 179/71     Pulse Rate 10/11/19 1730 72     Resp 10/11/19 1730 16     Temp 10/11/19 1730 97.6 F (36.4 C)     Temp Source 10/11/19 1730 Oral     SpO2 10/11/19 1730 100 %     Weight 10/11/19 1731 185 lb (83.9 kg)     Height 10/11/19 1731 5\' 11"  (1.803 m)     Head Circumference  --      Peak Flow --      Pain Score 10/11/19 1731 0     Pain Loc --      Pain Edu? --      Excl. in [place]? --     Constitutional: Alert and oriented. Well appearing and in no acute distress. Eyes: Conjunctivae are normal.  Head: Atraumatic.Marland Kitchen   Neck:  supple no lymphadenopathy noted Cardiovascular: Normal rate, regular rhythm. Heart sounds are normal Respiratory: Normal respiratory effort.  No retractions, lungs c t a  GU: deferred Musculoskeletal: FROM all extremities, warm and well perfused Neurologic:  Normal speech and language.  Skin:  Skin is warm, dry and intact. No rash noted. Psychiatric: Mood and affect are  normal. Speech and behavior are normal.  ____________________________________________   LABS (all labs ordered are listed, but only abnormal results are displayed)  Labs Reviewed  CBC - Abnormal; Notable for the following components:      Result Value   RBC 3.10 (*)    Hemoglobin 9.7 (*)    HCT 30.7 (*)    All other components within normal limits  COMPREHENSIVE METABOLIC PANEL - Abnormal; Notable for the following components:   Glucose, Bld 163 (*)    BUN 47 (*)    Creatinine, Ser 1.80 (*)    GFR calc non Af Amer 37 (*)    GFR calc Af Amer 43 (*)    All other components within normal limits   ____________________________________________   ____________________________________________  RADIOLOGY    ____________________________________________   PROCEDURES  Procedure(s) performed: No  Procedures    ____________________________________________   INITIAL IMPRESSION / ASSESSMENT AND PLAN / ED COURSE  Pertinent labs & imaging results that were available during my care of the patient were reviewed by me and considered in my medical decision making (see chart for details).   Patient 69 year old male presents emergency department with concerns of elevated potassium.  See HPI  Physical exam shows patient to appear well.  Exam is basically  unremarkable  CBC has decreased H&H of 9.7 and 30.7, glucose is elevated at 163, BUN and creatinine are slightly elevated at 47 and 1.8, potassium is in normal range at 4.7  I did discuss the lab value with Dr. Archie Balboa.  He agrees patient is stable and able to return home.  Explained the findings to the patient.  I do not feel he needs further treatment from the ED at this time.  Joshua Cherry "followed up at his regular doctor's office.  He states he agrees and understands.  He was discharged in stable condition.    Joshua Cherry was evaluated in Emergency Department on 10/11/2019 for the symptoms described in the history of present illness. He was evaluated in the context of the global COVID-19 pandemic, which necessitated consideration that the patient might be at risk for infection with the SARS-CoV-2 virus that causes COVID-19. Institutional protocols and algorithms that pertain to the evaluation of patients at risk for COVID-19 are in a state of rapid change based on information released by regulatory bodies including the CDC and federal and state organizations. These policies and algorithms were followed during the patient's care in the ED.   As part of my medical decision making, I reviewed the following data within the [place] notes reviewed and incorporated, Labs reviewed see above, Notes from prior ED visits and [place] Controlled Substance Database  ____________________________________________   FINAL CLINICAL IMPRESSION(S) / ED DIAGNOSES  Final diagnoses:  Concern about disease without diagnosis      NEW MEDICATIONS STARTED DURING THIS VISIT:  New Prescriptions   No medications on file     Note:  This document was prepared using Dragon voice recognition software and may include unintentional dictation errors.    Versie Starks, PA-C 10/11/19 2109    Nance Pear, MD 10/11/19 2122

## 2019-10-11 NOTE — ED Notes (Signed)
Dr Smith Mince sent pt over for abnormal labs. Pt is not dialysis but chronic kidney disease. Labs done showed no change in renal function but potassium of 6.4. Pt is asymptomatic. MD asking to redraw labs and treat hyperkalemia.

## 2019-10-15 DIAGNOSIS — Z6826 Body mass index (BMI) 26.0-26.9, adult: Secondary | ICD-10-CM | POA: Diagnosis not present

## 2019-10-15 DIAGNOSIS — D631 Anemia in chronic kidney disease: Secondary | ICD-10-CM | POA: Diagnosis not present

## 2019-10-15 DIAGNOSIS — N1832 Chronic kidney disease, stage 3b: Secondary | ICD-10-CM | POA: Diagnosis not present

## 2019-10-15 DIAGNOSIS — I10 Essential (primary) hypertension: Secondary | ICD-10-CM | POA: Diagnosis not present

## 2019-10-15 DIAGNOSIS — E139 Other specified diabetes mellitus without complications: Secondary | ICD-10-CM | POA: Diagnosis not present

## 2019-10-17 DIAGNOSIS — D631 Anemia in chronic kidney disease: Secondary | ICD-10-CM | POA: Diagnosis not present

## 2019-10-17 DIAGNOSIS — Z86010 Personal history of colonic polyps: Secondary | ICD-10-CM | POA: Diagnosis not present

## 2019-10-17 DIAGNOSIS — N183 Chronic kidney disease, stage 3 unspecified: Secondary | ICD-10-CM | POA: Diagnosis not present

## 2019-10-17 DIAGNOSIS — K219 Gastro-esophageal reflux disease without esophagitis: Secondary | ICD-10-CM | POA: Diagnosis not present

## 2019-10-25 DIAGNOSIS — E113293 Type 2 diabetes mellitus with mild nonproliferative diabetic retinopathy without macular edema, bilateral: Secondary | ICD-10-CM | POA: Diagnosis not present

## 2019-11-09 DIAGNOSIS — I10 Essential (primary) hypertension: Secondary | ICD-10-CM | POA: Diagnosis not present

## 2019-11-09 DIAGNOSIS — K219 Gastro-esophageal reflux disease without esophagitis: Secondary | ICD-10-CM | POA: Diagnosis not present

## 2019-11-09 DIAGNOSIS — D631 Anemia in chronic kidney disease: Secondary | ICD-10-CM | POA: Diagnosis not present

## 2019-11-09 DIAGNOSIS — N1832 Chronic kidney disease, stage 3b: Secondary | ICD-10-CM | POA: Diagnosis not present

## 2019-11-09 DIAGNOSIS — E538 Deficiency of other specified B group vitamins: Secondary | ICD-10-CM | POA: Diagnosis not present

## 2019-11-09 DIAGNOSIS — E1121 Type 2 diabetes mellitus with diabetic nephropathy: Secondary | ICD-10-CM | POA: Diagnosis not present

## 2019-11-09 DIAGNOSIS — N401 Enlarged prostate with lower urinary tract symptoms: Secondary | ICD-10-CM | POA: Diagnosis not present

## 2019-11-09 DIAGNOSIS — Z0000 Encounter for general adult medical examination without abnormal findings: Secondary | ICD-10-CM | POA: Diagnosis not present

## 2019-11-09 DIAGNOSIS — E785 Hyperlipidemia, unspecified: Secondary | ICD-10-CM | POA: Diagnosis not present

## 2019-11-15 ENCOUNTER — Inpatient Hospital Stay: Payer: HMO | Attending: Hematology and Oncology

## 2019-11-15 DIAGNOSIS — E119 Type 2 diabetes mellitus without complications: Secondary | ICD-10-CM | POA: Insufficient documentation

## 2019-11-15 DIAGNOSIS — E611 Iron deficiency: Secondary | ICD-10-CM | POA: Diagnosis not present

## 2019-11-15 DIAGNOSIS — N400 Enlarged prostate without lower urinary tract symptoms: Secondary | ICD-10-CM | POA: Diagnosis not present

## 2019-11-15 DIAGNOSIS — Z79899 Other long term (current) drug therapy: Secondary | ICD-10-CM | POA: Insufficient documentation

## 2019-11-15 DIAGNOSIS — Z8249 Family history of ischemic heart disease and other diseases of the circulatory system: Secondary | ICD-10-CM | POA: Diagnosis not present

## 2019-11-15 DIAGNOSIS — I10 Essential (primary) hypertension: Secondary | ICD-10-CM | POA: Diagnosis not present

## 2019-11-15 DIAGNOSIS — Z7982 Long term (current) use of aspirin: Secondary | ICD-10-CM | POA: Insufficient documentation

## 2019-11-15 DIAGNOSIS — D631 Anemia in chronic kidney disease: Secondary | ICD-10-CM | POA: Diagnosis not present

## 2019-11-15 DIAGNOSIS — N183 Chronic kidney disease, stage 3 unspecified: Secondary | ICD-10-CM | POA: Insufficient documentation

## 2019-11-15 DIAGNOSIS — Z800 Family history of malignant neoplasm of digestive organs: Secondary | ICD-10-CM | POA: Diagnosis not present

## 2019-11-15 DIAGNOSIS — Z7984 Long term (current) use of oral hypoglycemic drugs: Secondary | ICD-10-CM | POA: Insufficient documentation

## 2019-11-15 DIAGNOSIS — Z833 Family history of diabetes mellitus: Secondary | ICD-10-CM | POA: Insufficient documentation

## 2019-11-15 DIAGNOSIS — E538 Deficiency of other specified B group vitamins: Secondary | ICD-10-CM | POA: Diagnosis not present

## 2019-11-15 LAB — CBC WITH DIFFERENTIAL/PLATELET
Abs Immature Granulocytes: 0.01 10*3/uL (ref 0.00–0.07)
Basophils Absolute: 0 10*3/uL (ref 0.0–0.1)
Basophils Relative: 1 %
Eosinophils Absolute: 0.4 10*3/uL (ref 0.0–0.5)
Eosinophils Relative: 8 %
HCT: 28 % — ABNORMAL LOW (ref 39.0–52.0)
Hemoglobin: 8.9 g/dL — ABNORMAL LOW (ref 13.0–17.0)
Immature Granulocytes: 0 %
Lymphocytes Relative: 21 %
Lymphs Abs: 0.9 10*3/uL (ref 0.7–4.0)
MCH: 31 pg (ref 26.0–34.0)
MCHC: 31.8 g/dL (ref 30.0–36.0)
MCV: 97.6 fL (ref 80.0–100.0)
Monocytes Absolute: 0.6 10*3/uL (ref 0.1–1.0)
Monocytes Relative: 13 %
Neutro Abs: 2.5 10*3/uL (ref 1.7–7.7)
Neutrophils Relative %: 57 %
Platelets: 192 10*3/uL (ref 150–400)
RBC: 2.87 MIL/uL — ABNORMAL LOW (ref 4.22–5.81)
RDW: 13.2 % (ref 11.5–15.5)
WBC: 4.4 10*3/uL (ref 4.0–10.5)
nRBC: 0 % (ref 0.0–0.2)

## 2019-11-15 LAB — FERRITIN: Ferritin: 81 ng/mL (ref 24–336)

## 2019-11-18 NOTE — Progress Notes (Signed)
[facility]  [address] [place], [place] [address] Phone: [phone]  Fax: [phone]   Clinic Day:  11/19/2019  Referring physician: [name], MD  Chief Complaint: Joshua Cherry is a 69 y.o. male with stage III chronic kidney disease and anemia who is seen for 9 month assessment.   HPI: The patient was last seen in the hematology clinic on 02/22/2019. At that time, he felt fine. Exam was unremarkable. Hematocrit was 30.2, hemoglobin 9.6, MCV 98.1, platelets 179,000, WBC 3,900. Ferritin was 73 with an iron saturation of 20% and a TIBC of 334. Creatinine 2.05. He remained on oral B12.   Patient was last seen in the hematology clinic on 08/16/2019 by Beckey Rutter, NP. At that time, patient was stable and exam was unremarkable. Patient continued oral B12.   Labs followed: 05/24/2019: Hematocrit 29.1, hemoglobin 9.5, MCV 99.0, platelets 202,000, WBC 5,300. Ferritin 100. Vitamin B12 884.  Folate 36.0.  08/15/2019: Hematocrit 31.0, hemoglobin 9.9. MCV 99.7, platelets 197,000, WBC 5,100.  11/09/2019 ([facility]): Creatinine 1.8. LFTs were normal. 11/15/2019: Hematocrit 28.0, hemoglobin 8.9, MCV 97.6, platelets 192,000, WBC 4,400. Ferritin 81.   During the interim, he was doing "alright". He has no symptoms. His diabetes is under good control. He has been on oral B12 for over a year. He is eating well.  He is seen by nephrologist every 6 months. He notes his kidney function is stable.   Patient received his last COVID-19 vaccine on 11/12/2019. He reported that his arm was sore.  He denied any other side effect.  Patient has an upcoming colonoscopy with Dr. Alice Reichert on 02/04/2020.   Past Medical History:  Diagnosis Date  . Anemia   . Chickenpox   . Chronic kidney disease   . Diabetes mellitus without complication ([place])   . GERD (gastroesophageal reflux disease)   . Heart murmur    followed by PCP  . Hypertension   . Prostate enlargement     . Torn meniscus    right    Past Surgical History:  Procedure Laterality Date  . CATARACT EXTRACTION W/PHACO Left 09/08/2016   Procedure: CATARACT EXTRACTION PHACO AND INTRAOCULAR LENS PLACEMENT (IOC);  Surgeon: Leandrew Koyanagi, MD;  Location: [place];  Service: Ophthalmology;  Laterality: Left;  DIABETIC left  . CATARACT EXTRACTION W/PHACO Right 09/29/2016   Procedure: CATARACT EXTRACTION PHACO AND INTRAOCULAR LENS PLACEMENT ([place])  right eye;  Surgeon: Leandrew Koyanagi, MD;  Location: [place];  Service: Ophthalmology;  Laterality: Right;  Diabetic - oral meds Right Eye IVA Topical  . COLONOSCOPY    . COLONOSCOPY    . ESOPHAGOGASTRODUODENOSCOPY    . ESOPHAGOGASTRODUODENOSCOPY      Family History  Problem Relation Age of Onset  . Diabetes Mother   . Hypertension Mother   . Colon cancer Mother   . Hyperlipidemia Mother   . CAD Mother   . Glaucoma Mother   . Heart disease Mother   . Heart attack Mother   . Stroke Mother   . Diabetes Father   . Alzheimer's disease Father   . Hypertension Father   . Hyperlipidemia Father   . Hip fracture Father   . Diabetes Sister   . Glaucoma Sister   . Cancer Maternal Grandmother     Social History:  reports that he has never smoked. He has never used smokeless tobacco. He reports that he does not drink alcohol or use drugs. 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 The patient is alone today.  Allergies: No Known Allergies  Current Medications: Current Outpatient Medications  Medication Sig Dispense Refill  . aspirin EC 81 MG tablet Take 81 mg by mouth daily.     . B Complex-C (B COMPLEX-VITAMIN C) CAPS Take 1 tablet by mouth daily.     . carvedilol (COREG) 3.125 MG tablet Take 3.125 mg by mouth 2 (two) times daily with a meal.     . chlorthalidone (HYGROTON) 50 MG tablet Take by mouth.    . Cholecalciferol (VITAMIN D3) 2000 UNITS capsule Take 2,000 Units by  mouth daily.     . finasteride (PROSCAR) 5 MG tablet Take 5 mg by mouth daily.     Marland Kitchen glipiZIDE (GLUCOTROL) 10 MG tablet Take by mouth. Take 1 tablet (10 mg total) by mouth 2 (two) times daily before meals    . lisinopril (PRINIVIL,ZESTRIL) 40 MG tablet Take 40 mg by mouth daily.    Marland Kitchen lovastatin (MEVACOR) 40 MG tablet Take 40 mg by mouth at bedtime.    Marland Kitchen omeprazole (PRILOSEC) 20 MG capsule Take 20 mg by mouth daily.     Glory Rosebush DELICA LANCETS 81W MISC     . ONETOUCH ULTRA test strip USE ONCE DAILY AS DIRECTED   WHAT MACHINE  NOTHING SINCE 2018    . pioglitazone (ACTOS) 45 MG tablet Take 45 mg by mouth daily.     . sitaGLIPtin (JANUVIA) 25 MG tablet Take 50 mg by mouth daily.     . tamsulosin (FLOMAX) 0.4 MG CAPS capsule Take 0.4 mg by mouth daily after breakfast.     . vitamin B-12 (CYANOCOBALAMIN) 1000 MCG tablet Take 1,000 mcg by mouth daily.    . chlorthalidone (HYGROTON) 25 MG tablet Take 25 mg by mouth daily.    Marland Kitchen spironolactone (ALDACTONE) 25 MG tablet Take 0.5 tablets by mouth daily.     No current facility-administered medications for this visit.    Review of Systems  Constitutional: Negative for chills, diaphoresis, fever, malaise/fatigue and weight loss (up 1 lb).       Doing "alright". No symptoms.  HENT: Negative.  Negative for congestion, ear pain, hearing loss, nosebleeds, sinus pain and sore throat.   Eyes: Negative.  Negative for blurred vision, double vision and photophobia.  Respiratory: Negative.  Negative for cough, shortness of breath and wheezing.   Cardiovascular: Negative.  Negative for chest pain, palpitations, claudication, leg swelling and PND.  Gastrointestinal: Negative.  Negative for abdominal pain, blood in stool, constipation, diarrhea, heartburn, melena, nausea and vomiting.       Eating well.  Colonoscopy planned in 01/2020.  Genitourinary: Negative.  Negative for dysuria, frequency, hematuria and urgency.  Musculoskeletal: Negative.  Negative for back  pain, joint pain and myalgias.  Skin: Negative.  Negative for rash.  Neurological: Negative.  Negative for dizziness, tingling, sensory change, speech change, focal weakness, weakness and headaches.  Endo/Heme/Allergies: Does not bruise/bleed easily.       Diabetes.  Psychiatric/Behavioral: Negative.  Negative for depression, memory loss and substance abuse. The patient is not nervous/anxious and does not have insomnia.   All other systems reviewed and are negative.  Performance status (ECOG): 0  Vitals Blood pressure 136/72, pulse 63, temperature 97.7 F (36.5 C), temperature source Tympanic, resp. rate 18, height 5\' 11"  (1.803 m), weight 186 lb 6.4 oz (84.5 kg), SpO2 100 %.   Physical Exam  Constitutional: He is oriented to person, place, and  time. He appears well-developed and well-nourished. No distress.  HENT:  Head: Normocephalic and atraumatic.  Mouth/Throat: Oropharynx is clear and moist. No oropharyngeal exudate.  Short gray hair. Mask.  Eyes: Pupils are equal, round, and reactive to light. Conjunctivae and EOM are normal. No scleral icterus.  Glasses.  Blue eyes.  Cardiovascular: Normal rate and regular rhythm.  Murmur heard. Pulmonary/Chest: Effort normal and breath sounds normal. No respiratory distress. He has no wheezes.  Abdominal: Soft. Bowel sounds are normal. He exhibits no distension. There is no abdominal tenderness.  Musculoskeletal:        General: No edema. Normal range of motion.     Cervical back: Neck supple.  Lymphadenopathy:       Head (right side): No preauricular, no posterior auricular and no occipital adenopathy present.       Head (left side): No preauricular, no posterior auricular and no occipital adenopathy present.    He has no cervical adenopathy.    He has no axillary adenopathy.       Right: No inguinal and no supraclavicular adenopathy present.       Left: No inguinal and no supraclavicular adenopathy present.  Neurological: He is alert and  oriented to person, place, and time.  Skin: Skin is warm and dry. He is not diaphoretic. No erythema.  Psychiatric: He has a normal mood and affect. His behavior is normal. Judgment and thought content normal.  Nursing note and vitals reviewed.   No visits with results within 3 Day(s) from this visit.  Latest known visit with results is:  Appointment on 11/15/2019  Component Date Value Ref Range Status  . Ferritin 11/15/2019 81  24 - 336 ng/mL Final   Performed at [facility], [address] [place], [place] [address]  . WBC 11/15/2019 4.4  4.0 - 10.5 K/uL Final  . RBC 11/15/2019 2.87* 4.22 - 5.81 MIL/uL Final  . Hemoglobin 11/15/2019 8.9* 13.0 - 17.0 g/dL Final  . HCT 11/15/2019 28.0* 39.0 - 52.0 % Final  . MCV 11/15/2019 97.6  80.0 - 100.0 fL Final  . MCH 11/15/2019 31.0  26.0 - 34.0 pg Final  . MCHC 11/15/2019 31.8  30.0 - 36.0 g/dL Final  . RDW 11/15/2019 13.2  11.5 - 15.5 % Final  . Platelets 11/15/2019 192  150 - 400 K/uL Final  . nRBC 11/15/2019 0.0  0.0 - 0.2 % Final  . Neutrophils Relative % 11/15/2019 57  % Final  . Neutro Abs 11/15/2019 2.5  1.7 - 7.7 K/uL Final  . Lymphocytes Relative 11/15/2019 21  % Final  . Lymphs Abs 11/15/2019 0.9  0.7 - 4.0 K/uL Final  . Monocytes Relative 11/15/2019 13  % Final  . Monocytes Absolute 11/15/2019 0.6  0.1 - 1.0 K/uL Final  . Eosinophils Relative 11/15/2019 8  % Final  . Eosinophils Absolute 11/15/2019 0.4  0.0 - 0.5 K/uL Final  . Basophils Relative 11/15/2019 1  % Final  . Basophils Absolute 11/15/2019 0.0  0.0 - 0.1 K/uL Final  . Immature Granulocytes 11/15/2019 0  % Final  . Abs Immature Granulocytes 11/15/2019 0.01  0.00 - 0.07 K/uL Final   Performed at [facility], [address] [place], [place] [address]    Assessment:  Joshua Cherry is a 69 y.o. male with stage III chronic kidney diseaseand anemia. He has chronic kidney disease (creatinine clearance 37-41 ml/min).  Dietis good. He denies any  melena, hematochezia, or hematuria. He denies any pica or  restless legs.  EGD and colonoscopy noted only reflux in 2015. Guaiac cardswere negative. He never had a capsule study. Colonoscopy is scheduled for 02/04/2020.  He was diagnosed with B12 deficiencyin 2015. He previously received monthy injections at [facility]. He has been on oral B12 for > 1 year.  B12 was 884 and folate 36 on 05/24/2019.  He was a regular blood donor until approximately 2013-2014 when he was told his "iron was low".    Work-up in 03/2016revealed a hematocrit of 31.8, hemoglobin 10.3, MCV 95, with a normal WBC and platelet count. B12 and folate were normal. SPEP was normal on 07/09/2014. Iron studies were normal with a saturation of 18% and a TIBC of 297. Ferritin was 72. Erythropoietin was 10.6. Antinuclear antibody direct was positive with an anti-DNA double-stranded 24 (0-9). Sedimentation rate was 3. Haptoglobin was 181. Coombs was negative.   Work-up on 12/12/2018revealed a ferritin of 116. Iron saturation was 14% with a TIBC of 372. BUN was 59 with a Cr 1.98 (CrCl 33-38 ml/min). Epo level was 8.8. SPEP revealed no monoclonal protein. Retic was 1%. Urinalysis revealed no hematuria.  He received his last COVID-19 vaccine on 11/12/2019.  Symptomatically, he feels "alright".  He denies any bleeding.  Exam is stable.  Plan: 1.   Labs today: CBC with diff, retic, iron studies, B12, TSH. 2.   Anemia of chronic renal disease Clinically he denis any symptoms.             Hematocrit 27.6.  Hemoglobin 8.7.  MCV 98.2.   Retic 1.3%. Hemoglobin has drifted down from 9.7 to 8.7 in the past 6 weeks.   Ferritin 81 on 11/15/2019.   Iron saturation 17% with a TIBC 347 (normal) on 11/19/2019.  He denies any bleeding.  Diet is good.   B12 is 752 and TSH 2.118 (normal).  Discuss consideration of initiation of Retacrit.   Potential side effects reviewed.   Information  provided.   Preauth Retacrit. 3.   B12 deficiency B12 injections at [facility] were discontinued.              Patient has been on oral B12 > 1 year.  B12 level is 752 (normal).              Monitor B12 and folate annually. 4.   Contact Dr Smith Mince regarding plan to initiate Retacrit. 5.   RTC for MD assessment (telephone) for review of work-up.  I discussed the assessment and treatment plan with the patient.  The patient was provided an opportunity to ask questions and all were answered.  The patient agreed with the plan and demonstrated an understanding of the instructions.  The patient was advised to call back if the symptoms worsen or if the condition fails to improve as anticipated.   Lequita Asal, MD, PhD    11/19/2019, 1:49 PM  I, Selena Batten, am acting as scribe for [employer]. Mike Gip, MD, PhD.  I, Ahni Bradwell C. Mike Gip, MD, have reviewed the above documentation for accuracy and completeness, and I agree with the above.

## 2019-11-19 ENCOUNTER — Other Ambulatory Visit: Payer: Self-pay

## 2019-11-19 ENCOUNTER — Other Ambulatory Visit: Payer: Self-pay | Admitting: Hematology and Oncology

## 2019-11-19 ENCOUNTER — Encounter: Payer: Self-pay | Admitting: Hematology and Oncology

## 2019-11-19 ENCOUNTER — Inpatient Hospital Stay: Payer: HMO

## 2019-11-19 ENCOUNTER — Inpatient Hospital Stay (HOSPITAL_BASED_OUTPATIENT_CLINIC_OR_DEPARTMENT_OTHER): Payer: HMO | Admitting: Hematology and Oncology

## 2019-11-19 VITALS — BP 136/72 | HR 63 | Temp 97.7°F | Resp 18 | Ht 71.0 in | Wt 186.4 lb

## 2019-11-19 DIAGNOSIS — D509 Iron deficiency anemia, unspecified: Secondary | ICD-10-CM

## 2019-11-19 DIAGNOSIS — E538 Deficiency of other specified B group vitamins: Secondary | ICD-10-CM

## 2019-11-19 DIAGNOSIS — N183 Chronic kidney disease, stage 3 unspecified: Secondary | ICD-10-CM

## 2019-11-19 DIAGNOSIS — D631 Anemia in chronic kidney disease: Secondary | ICD-10-CM

## 2019-11-19 DIAGNOSIS — D649 Anemia, unspecified: Secondary | ICD-10-CM

## 2019-11-19 LAB — IRON AND TIBC
Iron: 60 ug/dL (ref 45–182)
Saturation Ratios: 17 % — ABNORMAL LOW (ref 17.9–39.5)
TIBC: 347 ug/dL (ref 250–450)
UIBC: 287 ug/dL

## 2019-11-19 LAB — CBC
HCT: 27.6 % — ABNORMAL LOW (ref 39.0–52.0)
Hemoglobin: 8.7 g/dL — ABNORMAL LOW (ref 13.0–17.0)
MCH: 31 pg (ref 26.0–34.0)
MCHC: 31.5 g/dL (ref 30.0–36.0)
MCV: 98.2 fL (ref 80.0–100.0)
Platelets: 196 10*3/uL (ref 150–400)
RBC: 2.81 MIL/uL — ABNORMAL LOW (ref 4.22–5.81)
RDW: 13.2 % (ref 11.5–15.5)
WBC: 4.4 10*3/uL (ref 4.0–10.5)
nRBC: 0 % (ref 0.0–0.2)

## 2019-11-19 LAB — RETICULOCYTES
Immature Retic Fract: 20.9 % — ABNORMAL HIGH (ref 2.3–15.9)
RBC.: 2.77 MIL/uL — ABNORMAL LOW (ref 4.22–5.81)
Retic Count, Absolute: 35.5 10*3/uL (ref 19.0–186.0)
Retic Ct Pct: 1.3 % (ref 0.4–3.1)

## 2019-11-19 LAB — VITAMIN B12: Vitamin B-12: 752 pg/mL (ref 180–914)

## 2019-11-19 LAB — TSH: TSH: 2.118 u[IU]/mL (ref 0.350–4.500)

## 2019-11-19 NOTE — Patient Instructions (Signed)

## 2019-11-19 NOTE — Progress Notes (Signed)
No new changes noted today 

## 2019-11-23 ENCOUNTER — Other Ambulatory Visit: Payer: Self-pay

## 2019-11-23 NOTE — Patient Outreach (Signed)
[facility]) Care Management Chronic Special Needs Program  11/23/2019  Name: Joshua Cherry DOB: 1951-07-27  MRN: [id number]  Mr. Domenic Schoenberger is enrolled in a chronic special needs plan for Diabetes. Chronic Care Management Coordinator telephoned client to review health risk assessment and to develop individualized care plan. HIPAA verified.  Introduced the chronic care management program, importance of client participation, and taking their care plan to all provider appointments and inpatient facilities.   Subjective: Client states he is doing well. He reports seeing his primary MD every 6 months. Client states his next follow up visit is 05/22/20. Client states he had an ED visit within the last couple of months. He states his doctor advised him to go to the ED because his potassium was elevated. Client states once at the ED his potassium was rechecked and the lab value was in the normal range.  Client states he is scheduled for a colonoscopy in June 2021. Client states his next follow up appointment with his endocrinologist is April 2021. Client reports he does not check his blood sugar very frequently.  He states he is just not consistent with it. RNCM explained to client it is important to know what your blood sugar runs, what the range of your blood sugar is, if your blood sugar is running high or low, and how your diabetes medication effects your blood sugar. Client verbalized understanding.  Client confirms his endocrinologist has advised him to check his blood sugar more regularly.   Goals Addressed            This Visit's Progress   .  Acknowledge receipt of Advanced Directive package       Advanced care planning discussed RN care manager will mail client Advanced directive packet on 11/23/19 Advised client to call RN case manager for social work referral if assistance is needed with Advanced directive    . " I want to lose some weight." (pt-stated)       RN care  manager referred client to [employer] health coach Incorporate physical exercise into your schedule at least 3 days per week.  RN care manager mailed client education article: Weight loss tips    . Client understands the importance of follow-up with providers by attending scheduled visits       Last primary care provide visit: 11/09/19, 05/01/19 Last endocrinology visit: 06/05/19 Continue to maintain and keep follow up visits with your providers.     . Client will use Assistive Devices as needed and verbalize understanding of device use       RN discussed with client importance of checking his blood sugars frequently. Advised to check blood sugars at least 4 days per week.  RN care manager send client education article: Blood glucose monitoring    . Client will verbalize knowledge of self management of Hypertension as evidences by BP reading of 140/90 or less; or as defined by provider       Assessed high blood pressure self management skills, ensured client has home blood pressure monitor RN case manager mailed client education article: High blood pressure: What you can do and Diabetes and blood pressure, Low salt diet Take your medications as prescribed Ask your provider, " what is my target blood pressure range."  Follow up with your provider as recommended.  Monitor your blood pressure at home and take results to your doctor appointments.     Marland Kitchen HEMOGLOBIN A1C < 7.0  Discussed diabetes self management actions:  Glucose monitoring per provider recommendation  Check feet daily  Visit provider every 3-6 months as directed  Hbg A1C level every 3-6 months.  Eye Exam yearly  Carbohydrate controlled meal planning  Taking diabetes medication as prescribed by provider  Physical activity     . Maintain timely refills of diabetic medication as prescribed within the year .       Client reports good medication taking behavior Review of medical record indicates client  maintains timely refills of diabetic medications Take medications as prescribed Follow up with your doctor as recommended.  Contact your RN case manager [phone]) if you have difficulty obtaining your medications     . Obtain annual  Lipid Profile, LDL-C       Lipid profile completed 11/09/19 The goal for LDL is less than 70 mg/ dl as you are at high risk for complications try to avoid saturated fats, trans-fats, and eat more fiber RN case manager will send client education article: heart healthy diet.     Illa Level Annual Eye (retinal)  Exam        Recent eye exam as reported by client February 2021 Plan to schedule your eye exam yearly    . Obtain Annual Foot Exam       Last foot exam 06/05/19 Diabetes foot care - Check feet daily at home (look for skin color changes, cuts, sores or cracks in the skin, swelling of feet or ankles, ingrown or fungal toenails, corn or calluses). Report these findings to your doctor - Wash feet with soap and water, dry feet well especially between toes - Moisturize your feet but not between the toes - Always wear shoes that protect your whole feet.      . Obtain annual screen for micro albuminuria (urine) , nephropathy (kidney problems)       No recent micro albuminuria noted Attend yearly physical and follow up visits with your provider and complete lab work as recommended.      Illa Level Hemoglobin A1C at least 2 times per year       Hgb A1c completed 06/05/19,  12/01/18 Continue to keep your follow up appointments with your provider and have lab work completed as recommended.     . Visit Primary Care Provider or Endocrinologist at least 2 times per year        Last primary care provide visit: 11/09/19, 05/01/19 Last endocrinology visit: 06/05/19 Continue to maintain and keep follow up visits with your providers.       Assessment: Client is not meeting diabetes self-management goal of hemoglobin A1C of <7.0% with most recent reading of % on 7/6  /21 Client is not checking blood sugars consistently RNCM informed client he will receive education material regarding her diabetes and hypertension.  RNCM advised client to notify MD of any changes in condition prior to scheduled appointment. RNCM provided client her contact name and number and 24 hour nurse advise line [phone]  Client has good understanding of:  COVID-19 cause, symptoms, precautions (social distancing, stay at home order, hand washing, wear face covering when unable to maintain or ensure 6 foot social distancing), and symptoms requiring provider notification.  Plan:  Send successful outreach letter with a copy of their individualized care plan, Send individual care plan to provider and Send educational material  Chronic care management coordination will outreach in:  6 Months  RNCM will refer client to [employer]TA health coach    Lamar Blinks  Karlin Binion RN,BSN,CCM [facility] [phone]

## 2019-11-26 ENCOUNTER — Encounter: Payer: Self-pay | Admitting: Hematology and Oncology

## 2019-11-26 ENCOUNTER — Inpatient Hospital Stay: Payer: HMO | Admitting: Hematology and Oncology

## 2019-11-26 NOTE — Progress Notes (Signed)
[facility]  [address], [address] [place], [place] [address] Phone: [phone]  Fax: [phone]   Telephone Visit:  11/29/2019  Referring physician: [name], MD  I connected with Joshua Cherry on 11/29/2019 at 8:54 AM by telephone conferencing and verified that I was speaking with the correct person using 2 identifiers.  The patient was at work.  I discussed the limitations, risk, security and privacy concerns of performing an evaluation and management service by telephone conferencing and the availability of in person appointments.  I also discussed with the patient that there may be a patient responsible charge related to this service.  The patient expressed understanding and agreed to proceed.   Chief Complaint: Joshua Cherry is a 69 y.o. male with stage III chronic kidney disease and anemia who is seen for a 1 week assessment.  HPI: The patient was last seen in the hematology clinic on 11/19/2019. At that time, he felt "alright".  He denied any bleeding.  Exam was stable.  Hematocrit was 27.6, hemoglobin 8.7, platelets 196,000, WBC 4,400. Iron saturation was 17% with a TIBC of 347. TSH was 2.118 (normal). Vitamin B12 was  752. Retic was 1.3%.  We discussed consideration of initiation of Retacrit.  During the interim, he has been doing well.  Diet is good.  He denies any bleeding. He notes no new symptoms since his last visit.  He would like to have his Retacrit injections early in the mornings.    Past Medical History:  Diagnosis Date  . Anemia   . Chickenpox   . Chronic kidney disease   . Diabetes mellitus without complication ([place])   . GERD (gastroesophageal reflux disease)   . Heart murmur    followed by PCP  . Hypertension   . Prostate enlargement   . Torn meniscus    right    Past Surgical History:  Procedure Laterality Date  . CATARACT EXTRACTION W/PHACO Left 09/08/2016   Procedure: CATARACT EXTRACTION PHACO AND INTRAOCULAR  LENS PLACEMENT (IOC);  Surgeon: Leandrew Koyanagi, MD;  Location: [facility];  Service: Ophthalmology;  Laterality: Left;  DIABETIC left  . CATARACT EXTRACTION W/PHACO Right 09/29/2016   Procedure: CATARACT EXTRACTION PHACO AND INTRAOCULAR LENS PLACEMENT ([place])  right eye;  Surgeon: Leandrew Koyanagi, MD;  Location: [facility];  Service: Ophthalmology;  Laterality: Right;  Diabetic - oral meds Right Eye IVA Topical  . COLONOSCOPY    . COLONOSCOPY    . ESOPHAGOGASTRODUODENOSCOPY    . ESOPHAGOGASTRODUODENOSCOPY      Family History  Problem Relation Age of Onset  . Diabetes Mother   . Hypertension Mother   . Colon cancer Mother   . Hyperlipidemia Mother   . CAD Mother   . Glaucoma Mother   . Heart disease Mother   . Heart attack Mother   . Stroke Mother   . Diabetes Father   . Alzheimer's disease Father   . Hypertension Father   . Hyperlipidemia Father   . Hip fracture Father   . Diabetes Sister   . Glaucoma Sister   . Cancer Maternal Grandmother     Social History:  reports that he has never smoked. He has never used smokeless tobacco. He reports that he does not drink alcohol or use drugs. 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  Participants in the patient's visit and their role in the encounter included the patient  and Vito Berger, CMA, today.  The intake visit was provided by Vito Berger, CMA.  Allergies: No Known Allergies  Current Medications: Current Outpatient Medications  Medication Sig Dispense Refill  . aspirin EC 81 MG tablet Take 81 mg by mouth daily.     . B Complex-C (B COMPLEX-VITAMIN C) CAPS Take 1 tablet by mouth daily.     . carvedilol (COREG) 3.125 MG tablet Take 3.125 mg by mouth 2 (two) times daily with a meal.     . chlorthalidone (HYGROTON) 25 MG tablet Take 25 mg by mouth daily.    . chlorthalidone (HYGROTON) 50 MG tablet Take by mouth.    .  Cholecalciferol (VITAMIN D3) 2000 UNITS capsule Take 2,000 Units by mouth daily.     . finasteride (PROSCAR) 5 MG tablet Take 5 mg by mouth daily.     Marland Kitchen glipiZIDE (GLUCOTROL) 10 MG tablet Take by mouth. Take 1 tablet (10 mg total) by mouth 2 (two) times daily before meals    . lisinopril (PRINIVIL,ZESTRIL) 40 MG tablet Take 40 mg by mouth daily.    Marland Kitchen lovastatin (MEVACOR) 40 MG tablet Take 40 mg by mouth at bedtime.    Marland Kitchen omeprazole (PRILOSEC) 20 MG capsule Take 40 mg by mouth in the morning and at bedtime.     Glory Rosebush DELICA LANCETS 04V MISC     . ONETOUCH ULTRA test strip USE ONCE DAILY AS DIRECTED   WHAT MACHINE  NOTHING SINCE 2018    . pioglitazone (ACTOS) 45 MG tablet Take 45 mg by mouth daily.     . sitaGLIPtin (JANUVIA) 25 MG tablet Take 50 mg by mouth daily.     . sitaGLIPtin (JANUVIA) 50 MG tablet TAKE 1 TABLET BY MOUTH EVERY DAY    . spironolactone (ALDACTONE) 25 MG tablet Take 25 mg by mouth daily. Client states he is taking 1/2 tablet daily    . tamsulosin (FLOMAX) 0.4 MG CAPS capsule Take 0.4 mg by mouth daily after breakfast.     . vitamin B-12 (CYANOCOBALAMIN) 1000 MCG tablet Take 1,000 mcg by mouth daily.    Marland Kitchen spironolactone (ALDACTONE) 25 MG tablet Take 0.5 tablets by mouth daily.     No current facility-administered medications for this visit.    Review of Systems  Constitutional: Negative.  Negative for chills, diaphoresis, fever, malaise/fatigue and weight loss.       No concerns.  HENT: Negative.  Negative for congestion, ear discharge, ear pain, hearing loss, nosebleeds, sinus pain, sore throat and tinnitus.   Eyes: Negative.  Negative for blurred vision and double vision.  Respiratory: Negative.  Negative for cough, hemoptysis, sputum production and shortness of breath.   Cardiovascular: Negative.  Negative for chest pain, palpitations and leg swelling.  Gastrointestinal: Negative.  Negative for abdominal pain, Cherry in stool, constipation, diarrhea, heartburn,  melena, nausea and vomiting.  Genitourinary: Negative.  Negative for dysuria, frequency, hematuria and urgency.  Musculoskeletal: Negative.  Negative for back pain, joint pain, myalgias and neck pain.  Skin: Negative.  Negative for itching and rash.  Neurological: Negative.  Negative for dizziness, tingling, sensory change, speech change, weakness and headaches.  Endo/Heme/Allergies: Does not bruise/bleed easily.       Diabetes.  Psychiatric/Behavioral: Negative.  Negative for depression and memory loss. The patient is not nervous/anxious and does not have insomnia.   All other systems reviewed and are negative.  Performance status (ECOG): 0    No visits with results within 3 Day(s) from this visit.  Latest known visit with results is:  Appointment on 11/19/2019  Component Date Value Ref Range Status  . TSH 11/19/2019 2.118  0.350 - 4.500 uIU/mL Final   Comment: Performed by a 3rd Generation assay with a functional sensitivity of <=0.01 uIU/mL. Performed at [facility], [address], [place], [place] [address]   . WBC 11/19/2019 4.4  4.0 - 10.5 K/uL Final  . RBC 11/19/2019 2.81* 4.22 - 5.81 MIL/uL Final  . Hemoglobin 11/19/2019 8.7* 13.0 - 17.0 g/dL Final  . HCT 11/19/2019 27.6* 39.0 - 52.0 % Final  . MCV 11/19/2019 98.2  80.0 - 100.0 fL Final  . MCH 11/19/2019 31.0  26.0 - 34.0 pg Final  . MCHC 11/19/2019 31.5  30.0 - 36.0 g/dL Final  . RDW 11/19/2019 13.2  11.5 - 15.5 % Final  . Platelets 11/19/2019 196  150 - 400 K/uL Final  . nRBC 11/19/2019 0.0  0.0 - 0.2 % Final   Performed at [facility], [address] [place], [place] [address]  . Vitamin B-12 11/19/2019 752  180 - 914 pg/mL Final   Comment: (NOTE) This assay is not validated for testing neonatal or myeloproliferative syndrome specimens for Vitamin B12 levels. Performed at [facility], [place] [address], [place], [place] [address]   . Retic Ct Pct 11/19/2019 1.3  0.4 - 3.1 % Final  .  RBC. 11/19/2019 2.77* 4.22 - 5.81 MIL/uL Final  . Retic Count, Absolute 11/19/2019 35.5  19.0 - 186.0 K/uL Final  . Immature Retic Fract 11/19/2019 20.9* 2.3 - 15.9 % Final   Performed at [facility], [address] [place], [place] [address]  . Iron 11/19/2019 60  45 - 182 ug/dL Final  . TIBC 11/19/2019 347  250 - 450 ug/dL Final  . Saturation Ratios 11/19/2019 17* 17.9 - 39.5 % Final  . UIBC 11/19/2019 287  ug/dL Final   Performed at [facility], [address] [place], [place] [address]    Assessment:  Joshua Cherry is a 69 y.o. male with stage III chronic kidney diseaseand anemia. He has chronic kidney disease (creatinine clearance 37-41 ml/min). Dietis good. He denies any melena, hematochezia, or hematuria. He denies any pica or restless legs.  EGD and colonoscopy noted only reflux in 2015. Guaiac cardswere negative. He never had a capsule study.   He was diagnosed with B12 deficiencyin 2015. He previously received monthy injections at [facility]. B12 was 884 and folate 36 on 05/24/2019.  He was a regular Cherry donor until approximately 2013-2014 when he was told his "iron was low".    Work-up in 03/2016revealed a hematocrit of 31.8, hemoglobin 10.3, MCV 95, with a normal WBC and platelet count. B12 and folate were normal. SPEP was normal on 07/09/2014. Iron studies were normal with a saturation of 18% and a TIBC of 297. Ferritin was 72. Erythropoietin was 10.6. Antinuclear antibody direct was positive with an anti-DNA double-stranded 24 (0-9). Sedimentation rate was 3. Haptoglobin was 181. Coombs was negative.   Work-up on 12/12/2018revealed a ferritin of 116. Iron saturation was 14% with a TIBC of 372. BUN was 59 with a Cr 1.98 (CrCl 33-38 ml/min). Epo level was 8.8. SPEP revealed no monoclonal protein. Retic was 1%. Urinalysis revealed no hematuria.  Ferritin was 81 on 11/15/2019.  Labs on 11/19/2019 included an iron saturation of 17%  with a TIBC of 347. TSH and B12 were normal. Retic was 1.3%.    Symptomatically, he denies  any complaint.  Plan: 1.  Anemia of chronic renal disease Clinically, he is doing well. Hemoglobin had been stable (9.5-9.7) but has recently dropped (8.7-8.9).   Additional work-up unrevealing.  Iron studies are normal.             He denies any bleeding.  Diet remains good.                         B12 is 752 and TSH 2.118 (normal).             Review plan for initiation of Retacrit.   Information previously provided.   Patient consented to initiation of treatment. 2.B12 deficiency Patient no longer on B12 injections.  He is on oral B12.  B12 was 752 on 11/19/2019.  Monitor B12 and folate annually. 3.   RTC in [place] every 2 weeks for labs (HCT/Hgb) and +/- Retacrit. 4.   RTC in 2 months for MD assessment in [place], labs (CBC with diff, ferritin, iron studies) and +/- Retacrit.  I discussed the assessment and treatment plan with the patient.  The patient was provided an opportunity to ask questions and all were answered.  The patient agreed with the plan and demonstrated an understanding of the instructions.  The patient was advised to call back or seek an in person evaluation if the symptoms worsen or if the condition fails to improve as anticipated.  I provided 8 minutes (8:54 AM - 9:02 AM) of non face-to-face telephone visit time during this this encounter and > 50% was spent counseling as documented under my assessment and plan.  I provided these services from the [facility] office.   Nolon Stalls, MD, PhD  11/29/2019, 8:54 AM  I, Selena Batten, am acting as scribe for [employer]. Mike Gip, MD, PhD.  I, Sheza Strickland C. Mike Gip, MD, have reviewed the above documentation for accuracy and completeness, and I agree with the above.

## 2019-11-26 NOTE — Progress Notes (Incomplete)
No new changes noted today 

## 2019-11-29 ENCOUNTER — Inpatient Hospital Stay: Payer: HMO | Attending: Hematology and Oncology | Admitting: Hematology and Oncology

## 2019-11-29 ENCOUNTER — Encounter: Payer: Self-pay | Admitting: Hematology and Oncology

## 2019-11-29 DIAGNOSIS — E538 Deficiency of other specified B group vitamins: Secondary | ICD-10-CM

## 2019-11-29 DIAGNOSIS — N183 Chronic kidney disease, stage 3 unspecified: Secondary | ICD-10-CM

## 2019-11-29 DIAGNOSIS — D631 Anemia in chronic kidney disease: Secondary | ICD-10-CM | POA: Diagnosis not present

## 2019-11-29 NOTE — Progress Notes (Signed)
No new changes noted today. The patient Name and DOB has been verified by phone today. 

## 2019-12-13 ENCOUNTER — Other Ambulatory Visit: Payer: Self-pay

## 2019-12-13 ENCOUNTER — Inpatient Hospital Stay: Payer: HMO

## 2019-12-13 VITALS — BP 150/68 | HR 59

## 2019-12-13 DIAGNOSIS — N183 Chronic kidney disease, stage 3 unspecified: Secondary | ICD-10-CM

## 2019-12-13 DIAGNOSIS — D631 Anemia in chronic kidney disease: Secondary | ICD-10-CM | POA: Diagnosis not present

## 2019-12-13 DIAGNOSIS — D509 Iron deficiency anemia, unspecified: Secondary | ICD-10-CM

## 2019-12-13 LAB — HEMOGLOBIN AND HEMATOCRIT, BLOOD
HCT: 30.5 % — ABNORMAL LOW (ref 39.0–52.0)
Hemoglobin: 9.8 g/dL — ABNORMAL LOW (ref 13.0–17.0)

## 2019-12-13 MED ORDER — EPOETIN ALFA-EPBX 10000 UNIT/ML IJ SOLN
10000.0000 [IU] | Freq: Once | INTRAMUSCULAR | Status: AC
  Administered 2019-12-13: 10000 [IU] via SUBCUTANEOUS
  Filled 2019-12-13: qty 1

## 2019-12-19 DIAGNOSIS — E1122 Type 2 diabetes mellitus with diabetic chronic kidney disease: Secondary | ICD-10-CM | POA: Diagnosis not present

## 2019-12-19 DIAGNOSIS — E1169 Type 2 diabetes mellitus with other specified complication: Secondary | ICD-10-CM | POA: Diagnosis not present

## 2019-12-19 DIAGNOSIS — N183 Chronic kidney disease, stage 3 unspecified: Secondary | ICD-10-CM | POA: Diagnosis not present

## 2019-12-19 DIAGNOSIS — E1159 Type 2 diabetes mellitus with other circulatory complications: Secondary | ICD-10-CM | POA: Diagnosis not present

## 2019-12-19 DIAGNOSIS — E785 Hyperlipidemia, unspecified: Secondary | ICD-10-CM | POA: Diagnosis not present

## 2019-12-19 DIAGNOSIS — I10 Essential (primary) hypertension: Secondary | ICD-10-CM | POA: Diagnosis not present

## 2019-12-27 ENCOUNTER — Other Ambulatory Visit: Payer: Self-pay

## 2019-12-27 ENCOUNTER — Inpatient Hospital Stay: Payer: HMO

## 2019-12-27 VITALS — BP 122/54 | HR 69

## 2019-12-27 DIAGNOSIS — N183 Chronic kidney disease, stage 3 unspecified: Secondary | ICD-10-CM | POA: Diagnosis not present

## 2019-12-27 DIAGNOSIS — D509 Iron deficiency anemia, unspecified: Secondary | ICD-10-CM

## 2019-12-27 DIAGNOSIS — D631 Anemia in chronic kidney disease: Secondary | ICD-10-CM

## 2019-12-27 LAB — HEMOGLOBIN AND HEMATOCRIT, BLOOD
HCT: 29.1 % — ABNORMAL LOW (ref 39.0–52.0)
Hemoglobin: 9.4 g/dL — ABNORMAL LOW (ref 13.0–17.0)

## 2019-12-27 MED ORDER — EPOETIN ALFA-EPBX 10000 UNIT/ML IJ SOLN
10000.0000 [IU] | Freq: Once | INTRAMUSCULAR | Status: AC
  Administered 2019-12-27: 10000 [IU] via SUBCUTANEOUS
  Filled 2019-12-27: qty 1

## 2020-01-10 ENCOUNTER — Inpatient Hospital Stay: Payer: HMO

## 2020-01-15 ENCOUNTER — Inpatient Hospital Stay: Payer: HMO | Attending: Hematology and Oncology

## 2020-01-15 ENCOUNTER — Inpatient Hospital Stay (HOSPITAL_BASED_OUTPATIENT_CLINIC_OR_DEPARTMENT_OTHER): Payer: HMO

## 2020-01-15 ENCOUNTER — Other Ambulatory Visit: Payer: Self-pay

## 2020-01-15 DIAGNOSIS — D631 Anemia in chronic kidney disease: Secondary | ICD-10-CM | POA: Diagnosis not present

## 2020-01-15 DIAGNOSIS — N183 Chronic kidney disease, stage 3 unspecified: Secondary | ICD-10-CM | POA: Insufficient documentation

## 2020-01-15 LAB — HEMOGLOBIN AND HEMATOCRIT, BLOOD
HCT: 33.5 % — ABNORMAL LOW (ref 39.0–52.0)
Hemoglobin: 10.8 g/dL — ABNORMAL LOW (ref 13.0–17.0)

## 2020-01-17 DIAGNOSIS — N1832 Chronic kidney disease, stage 3b: Secondary | ICD-10-CM | POA: Diagnosis not present

## 2020-01-17 DIAGNOSIS — I10 Essential (primary) hypertension: Secondary | ICD-10-CM | POA: Diagnosis not present

## 2020-01-17 DIAGNOSIS — D631 Anemia in chronic kidney disease: Secondary | ICD-10-CM | POA: Diagnosis not present

## 2020-01-17 DIAGNOSIS — E139 Other specified diabetes mellitus without complications: Secondary | ICD-10-CM | POA: Diagnosis not present

## 2020-01-17 DIAGNOSIS — Z6826 Body mass index (BMI) 26.0-26.9, adult: Secondary | ICD-10-CM | POA: Diagnosis not present

## 2020-01-24 ENCOUNTER — Inpatient Hospital Stay: Payer: HMO | Admitting: Hematology and Oncology

## 2020-01-24 ENCOUNTER — Inpatient Hospital Stay: Payer: HMO

## 2020-02-04 NOTE — Progress Notes (Signed)
[facility]  [address] [place], [place] [address] Phone: [phone]  Fax: [phone]   Clinic Day:  02/05/2020  Referring physician: [name], MD  Chief Complaint: Joshua Cherry is a 69 y.o. male with stage III chronic kidney disease and anemia who is seen for a 2 month assessment.  HPI: The patient was last seen in the hematology clinic on 11/29/2019 for a telephone visit. At that time, he denied any complaint. Hematocrit was 27.6, hemoglobin 8.7, platelets 196,000, WBC 4,400. Iron saturation was 17% and a TIBC of 347. TSH 2.118. Vitamin B12 was 752. Retic was 1.3%.   He saw Dr. Honor Junes on 12/19/2019 for follow-up of his diabetes. Exam was stable. Follow up planned for 6 months.   He saw Dr. Smith Mince on 01/17/2020 for follow-up of his stage 3a/b chronic kidney disease.  Lower extremity edema had improved. He had nocturia x 1-2. BUN was 44 and creatinine was 1.85.  Follow up planned for 6 months.   Labs followed: 12/13/2019: Hematocrit 30.5. Hemoglobin 9.8 12/27/2019: Hematocrit 29.1. Hemoglobin 9.4.  01/15/2020: Hematocrit 33.5. Hemoglobin 10.8.  Patient received Retacrit on 12/13/2019 and 12/27/2019.   He is scheduled for colonoscopy in 05/12/2020.  During the interim, he has been well overall. He notes that he can't feel when his numbers drop, and he denies fatigue. He denies blood in his stool.   He is vaccinated against COVID-19. He got his second vaccine on 11/12/2019. He denies any difficulties with the vaccine.    Past Medical History:  Diagnosis Date  . Anemia   . Chickenpox   . Chronic kidney disease   . Diabetes mellitus without complication ([place])   . GERD (gastroesophageal reflux disease)   . Heart murmur    followed by PCP  . Hypertension   . Prostate enlargement   . Torn meniscus    right    Past Surgical History:  Procedure Laterality Date  . CATARACT EXTRACTION W/PHACO Left 09/08/2016   Procedure:  CATARACT EXTRACTION PHACO AND INTRAOCULAR LENS PLACEMENT (IOC);  Surgeon: Leandrew Koyanagi, MD;  Location: [place];  Service: Ophthalmology;  Laterality: Left;  DIABETIC left  . CATARACT EXTRACTION W/PHACO Right 09/29/2016   Procedure: CATARACT EXTRACTION PHACO AND INTRAOCULAR LENS PLACEMENT ([place])  right eye;  Surgeon: Leandrew Koyanagi, MD;  Location: [place];  Service: Ophthalmology;  Laterality: Right;  Diabetic - oral meds Right Eye IVA Topical  . COLONOSCOPY    . COLONOSCOPY    . ESOPHAGOGASTRODUODENOSCOPY    . ESOPHAGOGASTRODUODENOSCOPY      Family History  Problem Relation Age of Onset  . Diabetes Mother   . Hypertension Mother   . Colon cancer Mother   . Hyperlipidemia Mother   . CAD Mother   . Glaucoma Mother   . Heart disease Mother   . Heart attack Mother   . Stroke Mother   . Diabetes Father   . Alzheimer's disease Father   . Hypertension Father   . Hyperlipidemia Father   . Hip fracture Father   . Diabetes Sister   . Glaucoma Sister   . Cancer Maternal Grandmother     Social History:  reports that he has never smoked. He has never used smokeless tobacco. He reports that he does not drink alcohol or use drugs. 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   Allergies: No Known Allergies  Current Medications: Current  Outpatient Medications  Medication Sig Dispense Refill  . aspirin EC 81 MG tablet Take 81 mg by mouth daily.     . B Complex-C (B COMPLEX-VITAMIN C) CAPS Take 1 tablet by mouth daily.     . carvedilol (COREG) 3.125 MG tablet Take 3.125 mg by mouth 2 (two) times daily with a meal.     . chlorthalidone (HYGROTON) 50 MG tablet Take by mouth.    . Cholecalciferol (VITAMIN D3) 2000 UNITS capsule Take 2,000 Units by mouth daily.     . finasteride (PROSCAR) 5 MG tablet Take 5 mg by mouth daily.     Marland Kitchen glipiZIDE (GLUCOTROL) 10 MG tablet Take by mouth. Take 1  tablet (10 mg total) by mouth 2 (two) times daily before meals    . lisinopril (PRINIVIL,ZESTRIL) 40 MG tablet Take 40 mg by mouth daily.    Marland Kitchen lovastatin (MEVACOR) 40 MG tablet Take 40 mg by mouth at bedtime.    Marland Kitchen omeprazole (PRILOSEC) 20 MG capsule Take 40 mg by mouth in the morning and at bedtime.     Glory Rosebush DELICA LANCETS 56Y MISC     . ONETOUCH ULTRA test strip USE ONCE DAILY AS DIRECTED   WHAT MACHINE  NOTHING SINCE 2018    . pioglitazone (ACTOS) 45 MG tablet Take 45 mg by mouth daily.     . sitaGLIPtin (JANUVIA) 50 MG tablet TAKE 1 TABLET BY MOUTH EVERY DAY    . spironolactone (ALDACTONE) 25 MG tablet Take 25 mg by mouth daily. Client states he is taking 1/2 tablet daily    . tamsulosin (FLOMAX) 0.4 MG CAPS capsule Take 0.4 mg by mouth daily after breakfast.     . vitamin B-12 (CYANOCOBALAMIN) 1000 MCG tablet Take 1,000 mcg by mouth daily.     No current facility-administered medications for this visit.    Review of Systems  Constitutional: Negative.  Negative for chills, diaphoresis, fever, malaise/fatigue and weight loss.       No concerns.  HENT: Negative.  Negative for congestion, ear discharge, ear pain, hearing loss, nosebleeds, sinus pain, sore throat and tinnitus.   Eyes: Negative.  Negative for blurred vision and double vision.  Respiratory: Negative.  Negative for cough, hemoptysis, sputum production and shortness of breath.   Cardiovascular: Negative.  Negative for palpitations and leg swelling.  Gastrointestinal: Negative.  Negative for abdominal pain, blood in stool, constipation, diarrhea, heartburn, melena, nausea and vomiting.  Genitourinary: Negative.  Negative for dysuria, frequency, hematuria and urgency.  Musculoskeletal: Negative.  Negative for back pain, joint pain, myalgias and neck pain.  Skin: Negative.  Negative for itching and rash.  Neurological: Negative.  Negative for dizziness, tingling, sensory change, speech change, weakness and headaches.    Endo/Heme/Allergies: Does not bruise/bleed easily.       Diabetes.  Psychiatric/Behavioral: Negative.  Negative for depression and memory loss. The patient is not nervous/anxious and does not have insomnia.   All other systems reviewed and are negative.  Performance status (ECOG): 0 - Asymptomatic    Vitals:   Physical Exam  Constitutional: He is oriented to person, place, and time. He appears well-developed and well-nourished. No distress.  HENT:  Head: Normocephalic and atraumatic.  Mouth/Throat: Oropharynx is clear and moist. No oropharyngeal exudate.  Eyes: Pupils are equal, round, and reactive to light. Conjunctivae and EOM are normal. No scleral icterus.  Cardiovascular: Normal rate, regular rhythm and normal heart sounds.  No murmur heard. Pulmonary/Chest: Effort normal and breath sounds normal. No  respiratory distress. He has no wheezes. He has no rales. He exhibits no tenderness.  Abdominal: Soft. Bowel sounds are normal. He exhibits no distension and no mass. There is no abdominal tenderness. There is no rebound and no guarding.  Musculoskeletal:        General: No tenderness or edema. Normal range of motion.     Cervical back: Normal range of motion and neck supple.  Lymphadenopathy:    He has no cervical adenopathy.    He has no axillary adenopathy.       Right: No supraclavicular adenopathy present.       Left: No supraclavicular adenopathy present.  Neurological: He is alert and oriented to person, place, and time.  Skin: Skin is warm and dry. He is not diaphoretic. No erythema.  Psychiatric: He has a normal mood and affect. His behavior is normal. Judgment and thought content normal.  Nursing note and vitals reviewed.   Appointment on 02/05/2020  Component Date Value Ref Range Status  . WBC 02/05/2020 5.5  4.0 - 10.5 K/uL Final  . RBC 02/05/2020 3.01* 4.22 - 5.81 MIL/uL Final  . Hemoglobin 02/05/2020 9.4* 13.0 - 17.0 g/dL Final  . HCT 02/05/2020 29.3* 39.0 -  52.0 % Final  . MCV 02/05/2020 97.3  80.0 - 100.0 fL Final  . MCH 02/05/2020 31.2  26.0 - 34.0 pg Final  . MCHC 02/05/2020 32.1  30.0 - 36.0 g/dL Final  . RDW 02/05/2020 13.5  11.5 - 15.5 % Final  . Platelets 02/05/2020 180  150 - 400 K/uL Final  . nRBC 02/05/2020 0.0  0.0 - 0.2 % Final  . Neutrophils Relative % 02/05/2020 69  % Final  . Neutro Abs 02/05/2020 3.8  1.7 - 7.7 K/uL Final  . Lymphocytes Relative 02/05/2020 16  % Final  . Lymphs Abs 02/05/2020 0.9  0.7 - 4.0 K/uL Final  . Monocytes Relative 02/05/2020 11  % Final  . Monocytes Absolute 02/05/2020 0.6  0.1 - 1.0 K/uL Final  . Eosinophils Relative 02/05/2020 4  % Final  . Eosinophils Absolute 02/05/2020 0.2  0.0 - 0.5 K/uL Final  . Basophils Relative 02/05/2020 0  % Final  . Basophils Absolute 02/05/2020 0.0  0.0 - 0.1 K/uL Final  . Immature Granulocytes 02/05/2020 0  % Final  . Abs Immature Granulocytes 02/05/2020 0.01  0.00 - 0.07 K/uL Final   Performed at [facility], [address] [place], [place] [address]    Assessment:  EMANI MORAD is a 69 y.o. male with stage III chronic kidney diseaseand anemia. He has chronic kidney disease (creatinine clearance 37-41 ml/min).  Dietis good. He denies any melena, hematochezia, or hematuria. He denies any pica or restless legs.  Work-up in 03/2016revealed a hematocrit of 31.8, hemoglobin 10.3, MCV 95, with a normal WBC and platelet count. B12 and folate were normal. SPEP was normal on 07/09/2014. Iron studies were normal with a saturation of 18% and a TIBC of 297. Ferritin was 72. Erythropoietin was 10.6. Antinuclear antibody direct was positive with an anti-DNA double-stranded 24 (0-9). Sedimentation rate was 3. Haptoglobin was 181. Coombs was negative.   Work-up on 12/12/2018revealed a ferritin of 116. Iron saturation was 14% with a TIBC of 372. BUN was 59 with a Cr 1.98 (CrCl 33-38 ml/min). Epo level was 8.8. SPEP revealed no monoclonal protein.  Retic was 1%. Urinalysis revealed no hematuria.  Ferritin was 81 on 11/15/2019.  Labs on 11/19/2019 included an iron saturation of 17%  with a TIBC of 347. TSH and B12 were normal. Retic was 1.3%.  He receives Retacrit every 2 weeks (12/13/2019 - 12/27/2019).  Ferritin was 81 on 11/15/2019.  EGD and colonoscopy noted only reflux in 2015. Guaiac cardswere negative. He never had a capsule study.   He was diagnosed with B12 deficiencyin 2015. He previously received monthy injections at [place]. B12 was 884 and folate 36 on 05/24/2019.  He was a regular blood donor until approximately 2013-2014 when he was told his "iron was low".    Colonoscopy is scheduled on 05/12/2020    Symptomatically, he has felt well.  Diet is good.  He denies blood in his stool.   Plan: 1.  Labs today: CBC with diff, ferritin, iron studies.  2.  Anemia of chronic renal disease Clinically, he is doing well Hematocrit 29.3 and hemoglobin 9.4 today   Hemoglobin had been stable (9.5-9.7).  Ferritin is 65 with an iron saturation of 20% and a TIBC of 343.             He denies any bleeding.  Diet remains good.                         B12 is 752 and TSH 2.118 (normal).             Retacrit started on 12/13/2019.  Retacrit today. 3.B12 deficiency Patient no longer on B12 injections.  He is on oral B12.  B12 was 752 on 11/19/2019.  Monitor B12 and folate annually. 4.   Retacrit today. 5.   RTC in [place] every 4 weeks for labs (HCT/Hgb) and +/- Retacrit. 6.   RTC in 12 weeks for MD assessment in [place], labs (CBC with diff, ferritin, iron studies, B12, folate) and +/- Retacrit.  I discussed the assessment and treatment plan with the patient.  The patient was provided an opportunity to ask questions and all were answered.  The patient agreed with the plan and demonstrated an understanding of the instructions.  The patient was advised to call back or seek an in person  evaluation if the symptoms worsen or if the condition fails to improve as anticipated.   Nolon Stalls, MD, PhD  02/05/2020, 10:23 AM  I, Jacqualyn Posey, am acting as a Education administrator for [employer]. Mike Gip, MD.   I, Amoni Scallan C. Mike Gip, MD, have reviewed the above documentation for accuracy and completeness, and I agree with the above.

## 2020-02-05 ENCOUNTER — Other Ambulatory Visit: Payer: Self-pay

## 2020-02-05 ENCOUNTER — Inpatient Hospital Stay: Payer: HMO

## 2020-02-05 ENCOUNTER — Inpatient Hospital Stay (HOSPITAL_BASED_OUTPATIENT_CLINIC_OR_DEPARTMENT_OTHER): Payer: HMO | Admitting: Hematology and Oncology

## 2020-02-05 ENCOUNTER — Inpatient Hospital Stay: Payer: HMO | Attending: Hematology and Oncology

## 2020-02-05 ENCOUNTER — Encounter: Payer: Self-pay | Admitting: Hematology and Oncology

## 2020-02-05 VITALS — BP 151/69 | HR 54 | Resp 18

## 2020-02-05 VITALS — BP 131/52 | HR 62 | Temp 96.3°F | Resp 16 | Wt 181.3 lb

## 2020-02-05 DIAGNOSIS — D631 Anemia in chronic kidney disease: Secondary | ICD-10-CM | POA: Diagnosis not present

## 2020-02-05 DIAGNOSIS — E538 Deficiency of other specified B group vitamins: Secondary | ICD-10-CM

## 2020-02-05 DIAGNOSIS — D509 Iron deficiency anemia, unspecified: Secondary | ICD-10-CM

## 2020-02-05 DIAGNOSIS — N183 Chronic kidney disease, stage 3 unspecified: Secondary | ICD-10-CM | POA: Diagnosis not present

## 2020-02-05 LAB — IRON AND TIBC
Iron: 70 ug/dL (ref 45–182)
Saturation Ratios: 20 % (ref 17.9–39.5)
TIBC: 343 ug/dL (ref 250–450)
UIBC: 273 ug/dL

## 2020-02-05 LAB — CBC WITH DIFFERENTIAL/PLATELET
Abs Immature Granulocytes: 0.01 10*3/uL (ref 0.00–0.07)
Basophils Absolute: 0 10*3/uL (ref 0.0–0.1)
Basophils Relative: 0 %
Eosinophils Absolute: 0.2 10*3/uL (ref 0.0–0.5)
Eosinophils Relative: 4 %
HCT: 29.3 % — ABNORMAL LOW (ref 39.0–52.0)
Hemoglobin: 9.4 g/dL — ABNORMAL LOW (ref 13.0–17.0)
Immature Granulocytes: 0 %
Lymphocytes Relative: 16 %
Lymphs Abs: 0.9 10*3/uL (ref 0.7–4.0)
MCH: 31.2 pg (ref 26.0–34.0)
MCHC: 32.1 g/dL (ref 30.0–36.0)
MCV: 97.3 fL (ref 80.0–100.0)
Monocytes Absolute: 0.6 10*3/uL (ref 0.1–1.0)
Monocytes Relative: 11 %
Neutro Abs: 3.8 10*3/uL (ref 1.7–7.7)
Neutrophils Relative %: 69 %
Platelets: 180 10*3/uL (ref 150–400)
RBC: 3.01 MIL/uL — ABNORMAL LOW (ref 4.22–5.81)
RDW: 13.5 % (ref 11.5–15.5)
WBC: 5.5 10*3/uL (ref 4.0–10.5)
nRBC: 0 % (ref 0.0–0.2)

## 2020-02-05 LAB — FERRITIN: Ferritin: 65 ng/mL (ref 24–336)

## 2020-02-05 MED ORDER — EPOETIN ALFA-EPBX 10000 UNIT/ML IJ SOLN
10000.0000 [IU] | Freq: Once | INTRAMUSCULAR | Status: AC
  Administered 2020-02-05: 10000 [IU] via SUBCUTANEOUS
  Filled 2020-02-05: qty 1

## 2020-02-13 ENCOUNTER — Other Ambulatory Visit: Payer: Medicare HMO

## 2020-02-14 ENCOUNTER — Ambulatory Visit: Payer: Medicare HMO | Admitting: Hematology and Oncology

## 2020-02-28 DIAGNOSIS — E119 Type 2 diabetes mellitus without complications: Secondary | ICD-10-CM | POA: Diagnosis not present

## 2020-02-28 DIAGNOSIS — B351 Tinea unguium: Secondary | ICD-10-CM | POA: Diagnosis not present

## 2020-02-28 DIAGNOSIS — Q6689 Other  specified congenital deformities of feet: Secondary | ICD-10-CM | POA: Diagnosis not present

## 2020-02-28 DIAGNOSIS — M778 Other enthesopathies, not elsewhere classified: Secondary | ICD-10-CM | POA: Diagnosis not present

## 2020-03-04 ENCOUNTER — Inpatient Hospital Stay: Payer: HMO | Attending: Hematology and Oncology

## 2020-03-04 ENCOUNTER — Other Ambulatory Visit: Payer: Self-pay

## 2020-03-04 ENCOUNTER — Inpatient Hospital Stay: Payer: HMO

## 2020-03-04 VITALS — BP 121/62 | HR 62

## 2020-03-04 DIAGNOSIS — D509 Iron deficiency anemia, unspecified: Secondary | ICD-10-CM

## 2020-03-04 DIAGNOSIS — N183 Chronic kidney disease, stage 3 unspecified: Secondary | ICD-10-CM | POA: Diagnosis not present

## 2020-03-04 DIAGNOSIS — E538 Deficiency of other specified B group vitamins: Secondary | ICD-10-CM

## 2020-03-04 DIAGNOSIS — D631 Anemia in chronic kidney disease: Secondary | ICD-10-CM | POA: Diagnosis not present

## 2020-03-04 LAB — HEMATOCRIT: HCT: 28.6 % — ABNORMAL LOW (ref 39.0–52.0)

## 2020-03-04 LAB — HEMOGLOBIN: Hemoglobin: 9.3 g/dL — ABNORMAL LOW (ref 13.0–17.0)

## 2020-03-04 MED ORDER — EPOETIN ALFA-EPBX 10000 UNIT/ML IJ SOLN
10000.0000 [IU] | Freq: Once | INTRAMUSCULAR | Status: AC
  Administered 2020-03-04: 10000 [IU] via SUBCUTANEOUS
  Filled 2020-03-04: qty 1

## 2020-03-05 DIAGNOSIS — N401 Enlarged prostate with lower urinary tract symptoms: Secondary | ICD-10-CM | POA: Diagnosis not present

## 2020-03-05 DIAGNOSIS — Z79899 Other long term (current) drug therapy: Secondary | ICD-10-CM | POA: Diagnosis not present

## 2020-03-06 DIAGNOSIS — N421 Congestion and hemorrhage of prostate: Secondary | ICD-10-CM | POA: Diagnosis not present

## 2020-03-06 DIAGNOSIS — Z125 Encounter for screening for malignant neoplasm of prostate: Secondary | ICD-10-CM | POA: Diagnosis not present

## 2020-04-01 ENCOUNTER — Other Ambulatory Visit: Payer: Self-pay

## 2020-04-01 ENCOUNTER — Inpatient Hospital Stay: Payer: HMO

## 2020-04-01 ENCOUNTER — Inpatient Hospital Stay: Payer: HMO | Attending: Hematology and Oncology

## 2020-04-01 VITALS — BP 145/64 | HR 64

## 2020-04-01 DIAGNOSIS — E538 Deficiency of other specified B group vitamins: Secondary | ICD-10-CM

## 2020-04-01 DIAGNOSIS — D509 Iron deficiency anemia, unspecified: Secondary | ICD-10-CM

## 2020-04-01 DIAGNOSIS — D631 Anemia in chronic kidney disease: Secondary | ICD-10-CM | POA: Diagnosis not present

## 2020-04-01 DIAGNOSIS — N183 Chronic kidney disease, stage 3 unspecified: Secondary | ICD-10-CM | POA: Insufficient documentation

## 2020-04-01 LAB — HEMATOCRIT: HCT: 29.9 % — ABNORMAL LOW (ref 39.0–52.0)

## 2020-04-01 LAB — HEMOGLOBIN: Hemoglobin: 9.7 g/dL — ABNORMAL LOW (ref 13.0–17.0)

## 2020-04-01 MED ORDER — EPOETIN ALFA-EPBX 10000 UNIT/ML IJ SOLN
10000.0000 [IU] | Freq: Once | INTRAMUSCULAR | Status: AC
  Administered 2020-04-01: 10000 [IU] via SUBCUTANEOUS
  Filled 2020-04-01: qty 1

## 2020-04-28 NOTE — Progress Notes (Signed)
[facility]  [address], [address] [place], [place] [address] Phone: [phone]  Fax: [phone]   Clinic Day:  04/29/2020  Referring physician: [name], MD  Chief Complaint: Joshua Cherry is a 69 y.o. male with stage III chronic kidney disease and anemia who is seen for 12 week assessment.  HPI: The patient was last seen in the hematology clinic on 02/05/2020. Hematocrit was 29.3, hemoglobin 9.4, MCV 97.3, platelets 180,000, WBC 5,500. Ferritin was 65 with an iron saturation of 20% and a TIBC of 343.  The patient received Retacrit 10,000 units monthly x 3 (02/05/2020 - 04/01/2020).  Labs followed: 03/04/2020: Hematocrit 28.6. Hemoglobin 9.3. 04/01/2020: Hematocrit 29.9. Hemoglobin 9.7.  He is scheduled for a colonoscopy on 05/12/2020.  During the interim, he has been "ok".  His diabetes is stable as far as he knows. He is taking oral vitamin B12. He is seeing his nephrologist in 06/2020.   Past Medical History:  Diagnosis Date  . Anemia   . Chickenpox   . Chronic kidney disease   . Diabetes mellitus without complication ([place])   . GERD (gastroesophageal reflux disease)   . Heart murmur    followed by PCP  . Hypertension   . Prostate enlargement   . Torn meniscus    right    Past Surgical History:  Procedure Laterality Date  . CATARACT EXTRACTION W/PHACO Left 09/08/2016   Procedure: CATARACT EXTRACTION PHACO AND INTRAOCULAR LENS PLACEMENT (IOC);  Surgeon: Leandrew Koyanagi, MD;  Location: [place];  Service: Ophthalmology;  Laterality: Left;  DIABETIC left  . CATARACT EXTRACTION W/PHACO Right 09/29/2016   Procedure: CATARACT EXTRACTION PHACO AND INTRAOCULAR LENS PLACEMENT ([place])  right eye;  Surgeon: Leandrew Koyanagi, MD;  Location: [place];  Service: Ophthalmology;  Laterality: Right;  Diabetic - oral meds Right Eye IVA Topical  . COLONOSCOPY    . COLONOSCOPY    . ESOPHAGOGASTRODUODENOSCOPY    .  ESOPHAGOGASTRODUODENOSCOPY      Family History  Problem Relation Age of Onset  . Diabetes Mother   . Hypertension Mother   . Colon cancer Mother   . Hyperlipidemia Mother   . CAD Mother   . Glaucoma Mother   . Heart disease Mother   . Heart attack Mother   . Stroke Mother   . Diabetes Father   . Alzheimer's disease Father   . Hypertension Father   . Hyperlipidemia Father   . Hip fracture Father   . Diabetes Sister   . Glaucoma Sister   . Cancer Maternal Grandmother     Social History:  reports that he has never smoked. He has never used smokeless tobacco. He reports that he does not drink alcohol and does not use drugs. 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   Allergies: No Known Allergies  Current Medications: Current Outpatient Medications  Medication Sig Dispense Refill  . aspirin EC 81 MG tablet Take 81 mg by mouth daily.     . B Complex-C (B COMPLEX-VITAMIN C) CAPS Take 1 tablet by mouth daily.     . carvedilol (COREG) 3.125 MG tablet Take 3.125 mg by mouth 2 (two) times daily with a meal.     . chlorthalidone (HYGROTON) 50 MG tablet Take by mouth.    . Cholecalciferol (VITAMIN D3) 2000 UNITS capsule Take 2,000 Units by mouth daily.     . finasteride (PROSCAR) 5 MG tablet  Take 5 mg by mouth daily.     Marland Kitchen glipiZIDE (GLUCOTROL) 10 MG tablet Take by mouth. Take 1 tablet (10 mg total) by mouth 2 (two) times daily before meals    . lisinopril (PRINIVIL,ZESTRIL) 40 MG tablet Take 40 mg by mouth daily.    Marland Kitchen lovastatin (MEVACOR) 40 MG tablet Take 40 mg by mouth at bedtime.    Marland Kitchen omeprazole (PRILOSEC) 20 MG capsule Take 40 mg by mouth in the morning and at bedtime.     Glory Rosebush DELICA LANCETS 28B MISC     . ONETOUCH ULTRA test strip USE ONCE DAILY AS DIRECTED   WHAT MACHINE  NOTHING SINCE 2018    . pioglitazone (ACTOS) 45 MG tablet Take 45 mg by mouth daily.     . sitaGLIPtin (JANUVIA) 50 MG tablet TAKE  1 TABLET BY MOUTH EVERY DAY    . spironolactone (ALDACTONE) 25 MG tablet Take 25 mg by mouth daily. Client states he is taking 1/2 tablet daily    . tamsulosin (FLOMAX) 0.4 MG CAPS capsule Take 0.4 mg by mouth daily after breakfast.     . vitamin B-12 (CYANOCOBALAMIN) 1000 MCG tablet Take 1,000 mcg by mouth daily.     No current facility-administered medications for this visit.    Review of Systems  Constitutional: Positive for weight loss (5 lbs). Negative for chills, diaphoresis, fever and malaise/fatigue.       No concerns.  HENT: Negative.  Negative for congestion, ear discharge, ear pain, hearing loss, nosebleeds, sinus pain, sore throat and tinnitus.   Eyes: Negative.  Negative for blurred vision and double vision.  Respiratory: Negative.  Negative for cough, hemoptysis, sputum production and shortness of breath.   Cardiovascular: Negative.  Negative for chest pain, palpitations and leg swelling.  Gastrointestinal: Negative.  Negative for abdominal pain, blood in stool, constipation, diarrhea, heartburn, melena, nausea and vomiting.  Genitourinary: Negative.  Negative for dysuria, frequency, hematuria and urgency.  Musculoskeletal: Negative.  Negative for back pain, joint pain, myalgias and neck pain.  Skin: Negative.  Negative for itching and rash.  Neurological: Negative.  Negative for dizziness, tingling, sensory change, speech change, weakness and headaches.  Endo/Heme/Allergies: Does not bruise/bleed easily.       Diabetes.  Psychiatric/Behavioral: Negative.  Negative for depression and memory loss. The patient is not nervous/anxious and does not have insomnia.   All other systems reviewed and are negative.  Performance status (ECOG): 0   Vitals:   Physical Exam Vitals and nursing note reviewed.  Constitutional:      General: He is not in acute distress.    Appearance: He is well-developed. He is not diaphoretic.  HENT:     Head: Normocephalic and atraumatic.      Comments: Short gray hair.    Mouth/Throat:     Pharynx: No oropharyngeal exudate.  Eyes:     General: No scleral icterus.    Conjunctiva/sclera: Conjunctivae normal.     Pupils: Pupils are equal, round, and reactive to light.     Comments: Blue eyes.  Cardiovascular:     Rate and Rhythm: Normal rate and regular rhythm.     Heart sounds: Normal heart sounds. No murmur heard.   Pulmonary:     Effort: Pulmonary effort is normal. No respiratory distress.     Breath sounds: Normal breath sounds. No wheezing or rales.  Chest:     Chest wall: No tenderness.  Abdominal:     General: Bowel sounds are normal. There  is no distension.     Palpations: Abdomen is soft. There is no hepatomegaly, splenomegaly or mass.     Tenderness: There is no abdominal tenderness. There is no guarding or rebound.  Musculoskeletal:        General: No tenderness. Normal range of motion.     Cervical back: Normal range of motion and neck supple.  Lymphadenopathy:     Head:     Right side of head: No preauricular, posterior auricular or occipital adenopathy.     Left side of head: No preauricular, posterior auricular or occipital adenopathy.     Cervical: No cervical adenopathy.     Upper Body:     Right upper body: No supraclavicular or axillary adenopathy.     Left upper body: No supraclavicular or axillary adenopathy.  Skin:    General: Skin is warm and dry.     Findings: No erythema.  Neurological:     Mental Status: He is alert and oriented to person, place, and time.  Psychiatric:        Behavior: Behavior normal.        Thought Content: Thought content normal.        Judgment: Judgment normal.     No visits with results within 3 Day(s) from this visit.  Latest known visit with results is:  Appointment on 04/01/2020  Component Date Value Ref Range Status  . Hemoglobin 04/01/2020 9.7* 13.0 - 17.0 g/dL Final   Performed at [facility], [address] [place], [place] [address]  . HCT  04/01/2020 29.9* 39 - 52 % Final   Performed at [facility], [address] [place], [place] [address]    Assessment:  Joshua Cherry is a 69 y.o. male with stage III chronic kidney diseaseand anemia. He has chronic kidney disease (creatinine clearance 37-41 ml/min).  Dietis good. He denies any melena, hematochezia, or hematuria. He denies any pica or restless legs.  Work-up in 03/2016revealed a hematocrit of 31.8, hemoglobin 10.3, MCV 95, with a normal WBC and platelet count. B12 and folate were normal. SPEP was normal on 07/09/2014. Iron studies were normal with a saturation of 18% and a TIBC of 297. Ferritin was 72. Erythropoietin was 10.6. Antinuclear antibody direct was positive with an anti-DNA double-stranded 24 (0-9). Sedimentation rate was 3. Haptoglobin was 181. Coombs was negative.   Work-up on 12/12/2018revealed a ferritin of 116. Iron saturation was 14% with a TIBC of 372. BUN was 59 with a Cr 1.98 (CrCl 33-38 ml/min). Epo level was 8.8. SPEP revealed no monoclonal protein. Retic was 1%. Urinalysis revealed no hematuria.  Ferritin was 81 on 11/15/2019.  Labs on 11/19/2019 included an iron saturation of 17% with a TIBC of 347. TSH and B12 were normal. Retic was 1.3%.  He receives Retacrit every 2 weeks (12/13/2019 - 12/27/2019) then monthly (last 04/01/2020).  Ferritin was 81 on 11/15/2019.  EGD and colonoscopy noted only reflux in 2015. Guaiac cardswere negative. He never had a capsule study.   He was diagnosed with B12 deficiencyin 2015. He previously received monthy injections at [place]. B12 was 994 and folate 26 on 04/29/2020.  He was a regular blood donor until approximately 2013-2014 when he was told his "iron was low".    Colonoscopy is scheduled on 05/12/2020.  The patient received both doses of the COVID-19 vaccine. His second dose was on 11/12/2019.    Symptomatically, he is doing well.  He denies any complaints.  Exam is  stable.  Plan: 1.  Labs today: CBC with diff, ferritin, iron studies, B12, folate. 2.  Anemia of chronic renal disease Clinically, he continues to do well. Hematocrit 32.1 and hemoglobin 10.2 today   Ferritin is 82 with an iron saturation of 27% and a TIBC of 365.             He denies any bleeding.  Diet is good..             Retacrit started on 12/13/2019.  Retacrit today. 3.B12 deficiency Patient no longer on B12 injections.  He is on oral B12.  B12 is 994 today.  Monitor B12 and folate annually. 4.   RTC in [place] every 4 weeks for labs (HCT/Hgb) and +/- Retacrit. 5.   RTC in 12 weeks for MD assessment in [place], labs (CBC with diff, ferritin, iron studies) and +/- Retacrit.  I discussed the assessment and treatment plan with the patient.  The patient was provided an opportunity to ask questions and all were answered.  The patient agreed with the plan and demonstrated an understanding of the instructions.  The patient was advised to call back or seek an in person evaluation if the symptoms worsen or if the condition fails to improve as anticipated.   Nolon Stalls, MD, PhD  04/29/2020, 10:36 AM  I, Mirian Mo Tufford, am acting as a Education administrator for [employer]. Mike Gip, MD.   I, Merrianne Mccumbers C. Mike Gip, MD, have reviewed the above documentation for accuracy and completeness, and I agree with the above.

## 2020-04-29 ENCOUNTER — Inpatient Hospital Stay: Payer: HMO

## 2020-04-29 ENCOUNTER — Other Ambulatory Visit: Payer: Self-pay

## 2020-04-29 ENCOUNTER — Encounter: Payer: Self-pay | Admitting: Hematology and Oncology

## 2020-04-29 ENCOUNTER — Inpatient Hospital Stay (HOSPITAL_BASED_OUTPATIENT_CLINIC_OR_DEPARTMENT_OTHER): Payer: HMO | Admitting: Hematology and Oncology

## 2020-04-29 VITALS — BP 132/62 | HR 58 | Temp 96.3°F | Resp 18 | Wt 176.4 lb

## 2020-04-29 DIAGNOSIS — D631 Anemia in chronic kidney disease: Secondary | ICD-10-CM

## 2020-04-29 DIAGNOSIS — D509 Iron deficiency anemia, unspecified: Secondary | ICD-10-CM

## 2020-04-29 DIAGNOSIS — E538 Deficiency of other specified B group vitamins: Secondary | ICD-10-CM

## 2020-04-29 DIAGNOSIS — N183 Chronic kidney disease, stage 3 unspecified: Secondary | ICD-10-CM

## 2020-04-29 LAB — CBC WITH DIFFERENTIAL/PLATELET
Abs Immature Granulocytes: 0.04 10*3/uL (ref 0.00–0.07)
Basophils Absolute: 0 10*3/uL (ref 0.0–0.1)
Basophils Relative: 0 %
Eosinophils Absolute: 0.3 10*3/uL (ref 0.0–0.5)
Eosinophils Relative: 6 %
HCT: 32.1 % — ABNORMAL LOW (ref 39.0–52.0)
Hemoglobin: 10.2 g/dL — ABNORMAL LOW (ref 13.0–17.0)
Immature Granulocytes: 1 %
Lymphocytes Relative: 20 %
Lymphs Abs: 1.1 10*3/uL (ref 0.7–4.0)
MCH: 31.1 pg (ref 26.0–34.0)
MCHC: 31.8 g/dL (ref 30.0–36.0)
MCV: 97.9 fL (ref 80.0–100.0)
Monocytes Absolute: 0.6 10*3/uL (ref 0.1–1.0)
Monocytes Relative: 12 %
Neutro Abs: 3.2 10*3/uL (ref 1.7–7.7)
Neutrophils Relative %: 61 %
Platelets: 202 10*3/uL (ref 150–400)
RBC: 3.28 MIL/uL — ABNORMAL LOW (ref 4.22–5.81)
RDW: 14.1 % (ref 11.5–15.5)
WBC: 5.3 10*3/uL (ref 4.0–10.5)
nRBC: 0 % (ref 0.0–0.2)

## 2020-04-29 LAB — IRON AND TIBC
Iron: 100 ug/dL (ref 45–182)
Saturation Ratios: 27 % (ref 17.9–39.5)
TIBC: 365 ug/dL (ref 250–450)
UIBC: 265 ug/dL

## 2020-04-29 LAB — FERRITIN: Ferritin: 82 ng/mL (ref 24–336)

## 2020-04-29 LAB — FOLATE: Folate: 26 ng/mL (ref >5.9)

## 2020-04-29 LAB — VITAMIN B12: Vitamin B-12: 994 pg/mL — ABNORMAL HIGH (ref 180–914)

## 2020-04-29 MED ORDER — EPOETIN ALFA-EPBX 10000 UNIT/ML IJ SOLN
10000.0000 [IU] | Freq: Once | INTRAMUSCULAR | Status: AC
  Administered 2020-04-29: 10000 [IU] via SUBCUTANEOUS

## 2020-05-08 ENCOUNTER — Other Ambulatory Visit: Payer: Self-pay

## 2020-05-08 ENCOUNTER — Other Ambulatory Visit
Admission: RE | Admit: 2020-05-08 | Discharge: 2020-05-08 | Disposition: A | Discharge status: Home / Self Care | Payer: HMO | Source: Ambulatory Visit | Attending: Internal Medicine | Admitting: Internal Medicine

## 2020-05-08 DIAGNOSIS — Z20822 Contact with and (suspected) exposure to covid-19: Secondary | ICD-10-CM | POA: Diagnosis not present

## 2020-05-08 DIAGNOSIS — Z01812 Encounter for preprocedural laboratory examination: Secondary | ICD-10-CM | POA: Insufficient documentation

## 2020-05-09 ENCOUNTER — Encounter: Payer: Self-pay | Admitting: Internal Medicine

## 2020-05-09 LAB — SARS CORONAVIRUS 2 (TAT 6-24 HRS): SARS Coronavirus 2: NEGATIVE

## 2020-05-12 ENCOUNTER — Ambulatory Visit: Payer: HMO | Admitting: Certified Registered Nurse Anesthetist

## 2020-05-12 ENCOUNTER — Encounter
Admission: RE | Disposition: A | Discharge status: Home / Self Care | Payer: Self-pay | Source: Home / Self Care | Attending: Internal Medicine

## 2020-05-12 ENCOUNTER — Ambulatory Visit
Admission: RE | Admit: 2020-05-12 | Discharge: 2020-05-12 | Disposition: A | Discharge status: Home / Self Care | Payer: HMO | Attending: Internal Medicine | Admitting: Internal Medicine

## 2020-05-12 DIAGNOSIS — E1122 Type 2 diabetes mellitus with diabetic chronic kidney disease: Secondary | ICD-10-CM | POA: Diagnosis not present

## 2020-05-12 DIAGNOSIS — K219 Gastro-esophageal reflux disease without esophagitis: Secondary | ICD-10-CM | POA: Diagnosis not present

## 2020-05-12 DIAGNOSIS — Z7984 Long term (current) use of oral hypoglycemic drugs: Secondary | ICD-10-CM | POA: Diagnosis not present

## 2020-05-12 DIAGNOSIS — Z79899 Other long term (current) drug therapy: Secondary | ICD-10-CM | POA: Diagnosis not present

## 2020-05-12 DIAGNOSIS — N189 Chronic kidney disease, unspecified: Secondary | ICD-10-CM | POA: Diagnosis not present

## 2020-05-12 DIAGNOSIS — Z8719 Personal history of other diseases of the digestive system: Secondary | ICD-10-CM | POA: Diagnosis not present

## 2020-05-12 DIAGNOSIS — I129 Hypertensive chronic kidney disease with stage 1 through stage 4 chronic kidney disease, or unspecified chronic kidney disease: Secondary | ICD-10-CM | POA: Insufficient documentation

## 2020-05-12 DIAGNOSIS — Z1211 Encounter for screening for malignant neoplasm of colon: Secondary | ICD-10-CM | POA: Insufficient documentation

## 2020-05-12 DIAGNOSIS — N400 Enlarged prostate without lower urinary tract symptoms: Secondary | ICD-10-CM | POA: Diagnosis not present

## 2020-05-12 DIAGNOSIS — Z86010 Personal history of colonic polyps: Secondary | ICD-10-CM | POA: Diagnosis not present

## 2020-05-12 DIAGNOSIS — K648 Other hemorrhoids: Secondary | ICD-10-CM | POA: Diagnosis not present

## 2020-05-12 DIAGNOSIS — Z7982 Long term (current) use of aspirin: Secondary | ICD-10-CM | POA: Insufficient documentation

## 2020-05-12 DIAGNOSIS — K640 First degree hemorrhoids: Secondary | ICD-10-CM | POA: Insufficient documentation

## 2020-05-12 HISTORY — PX: COLONOSCOPY WITH PROPOFOL: SHX5780

## 2020-05-12 SURGERY — COLONOSCOPY WITH PROPOFOL
Anesthesia: General

## 2020-05-12 MED ORDER — SODIUM CHLORIDE 0.9 % IV SOLN: INTRAVENOUS | Status: DC

## 2020-05-12 MED ORDER — LIDOCAINE HCL (CARDIAC) PF 100 MG/5ML IV SOSY
PREFILLED_SYRINGE | INTRAVENOUS | Status: DC | PRN
  Administered 2020-05-12: 50 mg via INTRAVENOUS

## 2020-05-12 MED ORDER — PROPOFOL 10 MG/ML IV BOLUS
INTRAVENOUS | Status: DC | PRN
  Administered 2020-05-12 (×2): 40 mg via INTRAVENOUS

## 2020-05-12 MED ORDER — PROPOFOL 10 MG/ML IV BOLUS
INTRAVENOUS | Status: AC
  Filled 2020-05-12: qty 40

## 2020-05-12 MED ORDER — PROPOFOL 500 MG/50ML IV EMUL
INTRAVENOUS | Status: DC | PRN
  Administered 2020-05-12: 75 ug/kg/min via INTRAVENOUS

## 2020-05-12 NOTE — H&P (Signed)
Outpatient short stay form Pre-procedure 05/12/2020 9:06 AM Joshua Cherry K. Alice Reichert, M.D.  Primary Physician: Thereasa Distance, M.D.  Reason for visit: Personal history of colon polyps (Tubular adenomas).  History of present illness:                            Patient presents for colonoscopy for a personal hx of colon polyps. The patient denies abdominal pain, abnormal weight loss or rectal bleeding.  08/10/13: CSY (indic: PHx polyps) - Mild edema of IC valve. Small internal hemorrhoids. Repeat in 07/2018. -- EGD (indic: IDA, heme + stool) - Esophageal mucosal changes suspicious for short segment Barrett's esophagus; path with chronic inflammation of cardiac type gastric mucosa and evidence of reflux esophagitis, but negative for Barrett's esophagus. Normal stomach and duodenum.  GI medications: -- Current: Omeprazole 20 mg BID. -- Prior: n/a.     No current facility-administered medications for this encounter.  Current Outpatient Medications:  .  aspirin EC 81 MG tablet, Take 81 mg by mouth daily. , Disp: , Rfl:  .  B Complex-C (B COMPLEX-VITAMIN C) CAPS, Take 1 tablet by mouth daily. , Disp: , Rfl:  .  carvedilol (COREG) 3.125 MG tablet, Take 3.125 mg by mouth 2 (two) times daily with a meal. , Disp: , Rfl:  .  chlorthalidone (HYGROTON) 50 MG tablet, Take by mouth., Disp: , Rfl:  .  Cholecalciferol (VITAMIN D3) 2000 UNITS capsule, Take 2,000 Units by mouth daily. , Disp: , Rfl:  .  finasteride (PROSCAR) 5 MG tablet, Take 5 mg by mouth daily. , Disp: , Rfl:  .  glipiZIDE (GLUCOTROL) 10 MG tablet, Take by mouth. Take 1 tablet (10 mg total) by mouth 2 (two) times daily before meals, Disp: , Rfl:  .  lisinopril (PRINIVIL,ZESTRIL) 40 MG tablet, Take 40 mg by mouth daily., Disp: , Rfl:  .  lovastatin (MEVACOR) 40 MG tablet, Take 40 mg by mouth at bedtime., Disp: , Rfl:  .  omeprazole (PRILOSEC) 20 MG capsule, Take 40 mg by mouth in the morning and at bedtime. , Disp: , Rfl:  .  ONETOUCH DELICA  LANCETS 81X MISC, , Disp: , Rfl:  .  ONETOUCH ULTRA test strip, USE ONCE DAILY AS DIRECTED   WHAT MACHINE  NOTHING SINCE 2018, Disp: , Rfl:  .  pioglitazone (ACTOS) 45 MG tablet, Take 45 mg by mouth daily. , Disp: , Rfl:  .  sitaGLIPtin (JANUVIA) 50 MG tablet, TAKE 1 TABLET BY MOUTH EVERY DAY, Disp: , Rfl:  .  spironolactone (ALDACTONE) 25 MG tablet, Take 25 mg by mouth daily. Client states he is taking 1/2 tablet daily, Disp: , Rfl:  .  tamsulosin (FLOMAX) 0.4 MG CAPS capsule, Take 0.4 mg by mouth daily after breakfast. , Disp: , Rfl:  .  vitamin B-12 (CYANOCOBALAMIN) 1000 MCG tablet, Take 1,000 mcg by mouth daily., Disp: , Rfl:   No medications prior to admission.     No Known Allergies   Past Medical History:  Diagnosis Date  . Anemia   . Chickenpox   . Chronic kidney disease   . Diabetes mellitus without complication ([place])   . GERD (gastroesophageal reflux disease)   . Heart murmur    followed by PCP  . Hypertension   . Prostate enlargement   . Torn meniscus    right    Review of systems:  Otherwise negative.    Physical Exam  Gen: Alert, oriented. Appears stated  age.  HEENT: [place]/AT. PERRLA. Lungs: CTA, no wheezes. CV: RR nl S1, S2. Abd: soft, benign, no masses. BS+ Ext: No edema. Pulses 2+    Planned procedures: Proceed with colonoscopy. The patient understands the nature of the planned procedure, indications, risks, alternatives and potential complications including but not limited to bleeding, infection, perforation, damage to internal organs and possible oversedation/side effects from anesthesia. The patient agrees and gives consent to proceed.  Please refer to procedure notes for findings, recommendations and patient disposition/instructions.     Tanith Dagostino K. Alice Reichert, M.D. Gastroenterology 05/12/2020  9:06 AM

## 2020-05-12 NOTE — Transfer of Care (Signed)
Immediate Anesthesia Transfer of Care Note  Patient: Joshua Cherry  Procedure(s) Performed: COLONOSCOPY WITH PROPOFOL (N/A )  Patient Location: PACU  Anesthesia Type:General  Level of Consciousness: awake, alert  and oriented  Airway & Oxygen Therapy: Patient Spontanous Breathing  Post-op Assessment: Report given to RN and Post -op Vital signs reviewed and stable  Post vital signs: Reviewed and stable  Last Vitals:  Vitals Value Taken Time  BP    Temp    Pulse    Resp    SpO2      Last Pain:  Vitals:   05/12/20 1010  TempSrc: Temporal  PainSc: 0-No pain         Complications: No complications documented.

## 2020-05-12 NOTE — Anesthesia Preprocedure Evaluation (Addendum)
Anesthesia Evaluation  Patient identified by MRN, date of birth, ID band Patient awake    Reviewed: Allergy & Precautions, NPO status , Patient's Chart, lab work & pertinent test results  History of Anesthesia Complications Negative for: history of anesthetic complications  Airway Mallampati: II  TM Distance: >3 FB Neck ROM: Full    Dental no notable dental hx. (+) Teeth Intact   Pulmonary neg pulmonary ROS, neg sleep apnea, neg COPD, Patient abstained from smoking.Not current smoker,    Pulmonary exam normal breath sounds clear to auscultation       Cardiovascular Exercise Tolerance: Good METShypertension, (-) CAD and (-) Past MI (-) dysrhythmias + Valvular Problems/Murmurs  Rhythm:Regular Rate:Normal - Systolic murmurs TTE 8588: INTERPRETATION  Closest EF: >55% (Estimated)  Contraction: Normal  Size: MILDLY ENLARGED RV  Mitral: TRIVIAL MR  Tricuspid: MILD TR  Borderline pulmonary hypertension with estimated RVSP 35 mmHg     Neuro/Psych negative neurological ROS  negative psych ROS   GI/Hepatic GERD  ,(+)     (-) substance abuse  ,   Endo/Other  diabetes  Renal/GU CRFRenal disease     Musculoskeletal   Abdominal   Peds  Hematology   Anesthesia Other Findings Past Medical History: No date: Anemia No date: Chickenpox No date: Chronic kidney disease No date: Diabetes mellitus without complication (H[facility]) No date: GERD (gastroesophageal reflux disease) No date: Heart murmur     Comment:  followed by PCP No date: Hypertension No date: Prostate enlargement No date: Torn meniscus     Comment:  right  Reproductive/Obstetrics                            Anesthesia Physical Anesthesia Plan  ASA: II  Anesthesia Plan: General   Post-op Pain Management:    Induction: Intravenous  PONV Risk Score and Plan: 2 and Ondansetron, Propofol infusion and TIVA  Airway Management Planned:  Nasal Cannula  Additional Equipment: None  Intra-op Plan:   Post-operative Plan:   Informed Consent: I have reviewed the patients History and Physical, chart, labs and discussed the procedure including the risks, benefits and alternatives for the proposed anesthesia with the patient or authorized representative who has indicated his/her understanding and acceptance.     Dental advisory given  Plan Discussed with: CRNA and Surgeon  Anesthesia Plan Comments: (Discussed risks of anesthesia with patient, including possibility of difficulty with spontaneous ventilation under anesthesia necessitating airway intervention, PONV, and rare risks such as cardiac or respiratory or neurological events. Patient understands.)        Anesthesia Quick Evaluation

## 2020-05-12 NOTE — Anesthesia Postprocedure Evaluation (Signed)
Anesthesia Post Note  Patient: Joshua Cherry  Procedure(s) Performed: COLONOSCOPY WITH PROPOFOL (N/A )  Patient location during evaluation: Endoscopy Anesthesia Type: General Level of consciousness: awake and alert Pain management: pain level controlled Vital Signs Assessment: post-procedure vital signs reviewed and stable Respiratory status: spontaneous breathing, nonlabored ventilation, respiratory function stable and patient connected to nasal cannula oxygen Cardiovascular status: blood pressure returned to baseline and stable Postop Assessment: no apparent nausea or vomiting Anesthetic complications: no   No complications documented.   Last Vitals:  Vitals:   05/12/20 1120 05/12/20 1130  BP: (!) 139/57 (!) 150/64  Pulse: 64 (!) 53  Resp: 16 14  Temp:    SpO2: 100% 100%    Last Pain:  Vitals:   05/12/20 1110  TempSrc: Temporal  PainSc:                  Arita Miss

## 2020-05-12 NOTE — Op Note (Signed)
[facility] Gastroenterology Patient Name: Joshua Cherry Procedure Date: 05/12/2020 10:38 AM MRN: [id number] Account #: [id number] Date of Birth: 06/02/1951 Admit Type: Outpatient Age: 69 Room: [facility] ENDO ROOM 3 Gender: Male Note Status: Finalized Procedure:             Colonoscopy Indications:           High risk colon cancer surveillance: Personal history                         of colonic polyps Providers:             Benay Pike. Alice Reichert MD, MD Referring MD:          [name] (Referring MD) Medicines:             Propofol per Anesthesia Complications:         No immediate complications. Procedure:             Pre-Anesthesia Assessment:                        - The risks and benefits of the procedure and the                         sedation options and risks were discussed with the                         patient. All questions were answered and informed                         consent was obtained.                        - Patient identification and proposed procedure were                         verified prior to the procedure by the nurse. The                         procedure was verified in the procedure room.                        - ASA Grade Assessment: III - A patient with severe                         systemic disease.                        - After reviewing the risks and benefits, the patient                         was deemed in satisfactory condition to undergo the                         procedure.                        After obtaining informed consent, the colonoscope was                         passed under direct vision. Throughout the procedure,  the patient's blood pressure, pulse, and oxygen                         saturations were monitored continuously. The                         Colonoscope was introduced through the anus and                         advanced to the the cecum, identified by appendiceal                          orifice and ileocecal valve. The colonoscopy was                         performed without difficulty. The patient tolerated                         the procedure well. The quality of the bowel                         preparation was adequate. The ileocecal valve,                         appendiceal orifice, and rectum were photographed. Findings:      The perianal and digital rectal examinations were normal. Pertinent       negatives include normal sphincter tone and no palpable rectal lesions.      The colon (entire examined portion) appeared normal.      Non-bleeding internal hemorrhoids were found during retroflexion. The       hemorrhoids were Grade I (internal hemorrhoids that do not prolapse).      The exam was otherwise without abnormality. Impression:            - The entire examined colon is normal.                        - Non-bleeding internal hemorrhoids.                        - The examination was otherwise normal.                        - No specimens collected. Recommendation:        - Patient has a contact number available for                         emergencies. The signs and symptoms of potential                         delayed complications were discussed with the patient.                         Return to normal activities tomorrow. Written                         discharge instructions were provided to the patient.                        -  Resume previous diet.                        - Continue present medications.                        - No repeat colonoscopy due to current age (81 years                         or older) and the absence of colonic polyps.                        - You do NOT require further colon cancer screening                         measures (Annual stool testing (i.e. hemoccult, FIT,                         cologuard), sigmoidoscopy, colonoscopy or CT                         colonography). You should share this  recommendation                         with your Primary Care provider.                        - Return to GI office PRN.                        - The findings and recommendations were discussed with                         the patient. Procedure Code(s):     --- Professional ---                        Q2297, Colorectal cancer screening; colonoscopy on                         individual at high risk Diagnosis Code(s):     --- Professional ---                        K64.0, First degree hemorrhoids                        Z86.010, Personal history of colonic polyps CPT copyright 2019 American Medical Association. All rights reserved. The codes documented in this report are preliminary and upon coder review may  be revised to meet current compliance requirements. Efrain Sella MD, MD 05/12/2020 11:12:21 AM This report has been signed electronically. Number of Addenda: 0 Note Initiated On: 05/12/2020 10:38 AM Scope Withdrawal Time: 0 hours 7 minutes 52 seconds  Total Procedure Duration: 0 hours 14 minutes 27 seconds  Estimated Blood Loss:  Estimated blood loss: none.      [facility]

## 2020-05-12 NOTE — Interval H&P Note (Signed)
History and Physical Interval Note:  05/12/2020 10:44 AM  Joshua Cherry  has presented today for surgery, with the diagnosis of PERSONAL HX.OF COLON POLYPS.  The various methods of treatment have been discussed with the patient and family. After consideration of risks, benefits and other options for treatment, the patient has consented to  Procedure(s): COLONOSCOPY WITH PROPOFOL (N/A) as a surgical intervention.  The patient's history has been reviewed, patient examined, no change in status, stable for surgery.  I have reviewed the patient's chart and labs.  Questions were answered to the patient's satisfaction.     [place], [place]

## 2020-05-13 ENCOUNTER — Other Ambulatory Visit: Payer: Self-pay

## 2020-05-13 NOTE — Patient Outreach (Signed)
[facility]) Care Management Chronic Special Needs Program  05/13/2020  Name: Joshua Cherry DOB: 07-20-1951  MRN: [id number]  Mr. Jacqueline Delapena is enrolled in a chronic special needs plan for DiabetesTelephone call to client for CSNP assessment follow up. HIPAA verified. Client states he is doing well. He reports having a colonoscopy on yesterday. States he has not had any problems.  Client states he had a good report from the colonoscopy. Client states he is checking his blood sugar 1 time per week. He states his last follow up with his endocrinologist was 12/19/19. Client reports his next  primary care provider visit is 05/22/20. Client states he follows up monthly with the oncologist to receive injections for his anemia.   Subjective:  Goals Addressed              This Visit's Progress   .   Acknowledge receipt of Advanced Directive package   On track     Advanced care planning discussed Advised client to call RN case manager for social work referral if assistance is needed with Advanced directive    .  " I want to lose some weight." (pt-stated)   On track     RN care manager referred client to [employer] health coach Incorporate physical exercise into your schedule at least 3 days per week.  Review [employer] exercise booklet sent to you in the mail.    .  COMPLETED: Client understands the importance of follow-up with providers by attending scheduled visits   On track     Last primary care provide visit: 11/09/19 Client reports next follow up with his primary care provider is 05/22/20 Last endocrinology visit: 12/19/19 Oncologist follow up on 04/29/20 and 02/05/20.      Marland Kitchen  Client will use Assistive Devices as needed and verbalize understanding of device use   On track     Re-discussed discussed with client importance of checking his blood sugars frequently. Advised to check blood sugars at least 4 days per week.  RN case manager will send  client education article How to keep tract of your blood sugar and Low blood sugar in people with diabetes.      .  client will verbalize      .  Client will verbalize knowledge of self management of Hypertension as evidences by BP reading of 140/90 or less; or as defined by provider   On track     Assessed high blood pressure self management skills, ensured client has home blood pressure monitor Continue to take your medications as prescribed.  Do not skip doses of blood pressure medicine.  The medicine does not work as well if you skip doses.  Plan to eat low salt and heart healthy meals full of fruits, vegetables, Whole grains, lean protein and limit fat, and sugars.  RN case manager mailed client education article: High blood pressure in adults.      Marland Kitchen  HEMOGLOBIN A1C < 7        Re-Discussed diabetes self management actions:  Glucose monitoring per provider recommendation  Check feet daily  Visit provider every 3-6 months as directed  Hbg A1C level every 3-6 months.  Eye Exam yearly  Carbohydrate controlled meal planning  Taking diabetes medication as prescribed by provider  Physical activity     .  Maintain timely refills of diabetic medication as prescribed within the year .   On track     Client reports good  medication taking behavior Continue to take medications as prescribed Follow up with your doctor as recommended.  Contact your RN case manager [phone]) if you have difficulty obtaining your medications     .  COMPLETED: Obtain annual  Lipid Profile, LDL-C   On track     Lipid profile completed 11/09/19     .  COMPLETED: Obtain Annual Eye (retinal)  Exam    On track     Recent eye exam as reported by client February 2021     .  Obtain Annual Foot Exam   On track     Last foot exam 06/05/19 It is important that your doctor check your feet regularly.  Discuss this with your doctor at your next visit.  . Diabetes can affect the nerves in your feet, causing  decreased feeling or numbness.    .  Obtain annual screen for micro albuminuria (urine) , nephropathy (kidney problems)   On track     No recent micro albuminuria noted . Diabetes can affect your kidneys.  . It is important for your doctor to check your urine at least once a year. These tests show how your kidneys are working. Discuss this with your doctor at your next visit.,       .  Obtain Hemoglobin A1C at least 2 times per year   On track     Hgb A1c completed  12/19/19,  06/05/19,  12/01/18 Continue to keep your follow up appointments with your provider and have lab work completed as recommended.     .  COMPLETED: Visit Primary Care Provider or Endocrinologist at least 2 times per year    On track     Last primary care provide visit: 11/09/19 Client reports next follow up with his primary care provider is 05/22/20 Last endocrinology visit: 12/19/19       Plan:  Send successful outreach letter with a copy of their individualized care plan, Send individual care plan to provider and Send educational material  Chronic care management coordinator will outreach in:  6 Months    Quinn Plowman RN,BSN,CCM [place] Management [phone]    .

## 2020-05-22 DIAGNOSIS — D631 Anemia in chronic kidney disease: Secondary | ICD-10-CM | POA: Diagnosis not present

## 2020-05-22 DIAGNOSIS — E1121 Type 2 diabetes mellitus with diabetic nephropathy: Secondary | ICD-10-CM | POA: Diagnosis not present

## 2020-05-22 DIAGNOSIS — N1832 Chronic kidney disease, stage 3b: Secondary | ICD-10-CM | POA: Diagnosis not present

## 2020-05-22 DIAGNOSIS — I10 Essential (primary) hypertension: Secondary | ICD-10-CM | POA: Diagnosis not present

## 2020-05-22 DIAGNOSIS — E785 Hyperlipidemia, unspecified: Secondary | ICD-10-CM | POA: Diagnosis not present

## 2020-05-22 DIAGNOSIS — Z23 Encounter for immunization: Secondary | ICD-10-CM | POA: Diagnosis not present

## 2020-05-22 DIAGNOSIS — N401 Enlarged prostate with lower urinary tract symptoms: Secondary | ICD-10-CM | POA: Diagnosis not present

## 2020-05-22 DIAGNOSIS — E538 Deficiency of other specified B group vitamins: Secondary | ICD-10-CM | POA: Diagnosis not present

## 2020-05-22 DIAGNOSIS — K219 Gastro-esophageal reflux disease without esophagitis: Secondary | ICD-10-CM | POA: Diagnosis not present

## 2020-05-27 ENCOUNTER — Inpatient Hospital Stay: Payer: HMO | Attending: Hematology and Oncology

## 2020-05-27 ENCOUNTER — Other Ambulatory Visit: Payer: Self-pay

## 2020-05-27 ENCOUNTER — Inpatient Hospital Stay: Payer: HMO

## 2020-05-27 VITALS — BP 140/59 | HR 60 | Temp 97.2°F | Resp 18

## 2020-05-27 DIAGNOSIS — E538 Deficiency of other specified B group vitamins: Secondary | ICD-10-CM

## 2020-05-27 DIAGNOSIS — D631 Anemia in chronic kidney disease: Secondary | ICD-10-CM | POA: Diagnosis not present

## 2020-05-27 DIAGNOSIS — N183 Chronic kidney disease, stage 3 unspecified: Secondary | ICD-10-CM | POA: Insufficient documentation

## 2020-05-27 DIAGNOSIS — D509 Iron deficiency anemia, unspecified: Secondary | ICD-10-CM

## 2020-05-27 LAB — HEMATOCRIT: HCT: 29 % — ABNORMAL LOW (ref 39.0–52.0)

## 2020-05-27 LAB — HEMOGLOBIN: Hemoglobin: 9.3 g/dL — ABNORMAL LOW (ref 13.0–17.0)

## 2020-05-27 MED ORDER — EPOETIN ALFA-EPBX 10000 UNIT/ML IJ SOLN
10000.0000 [IU] | Freq: Once | INTRAMUSCULAR | Status: AC
  Administered 2020-05-27: 10000 [IU] via SUBCUTANEOUS
  Filled 2020-05-27: qty 1

## 2020-06-19 DIAGNOSIS — E1159 Type 2 diabetes mellitus with other circulatory complications: Secondary | ICD-10-CM | POA: Diagnosis not present

## 2020-06-19 DIAGNOSIS — E1169 Type 2 diabetes mellitus with other specified complication: Secondary | ICD-10-CM | POA: Diagnosis not present

## 2020-06-19 DIAGNOSIS — E785 Hyperlipidemia, unspecified: Secondary | ICD-10-CM | POA: Diagnosis not present

## 2020-06-19 DIAGNOSIS — E1122 Type 2 diabetes mellitus with diabetic chronic kidney disease: Secondary | ICD-10-CM | POA: Diagnosis not present

## 2020-06-19 DIAGNOSIS — N183 Chronic kidney disease, stage 3 unspecified: Secondary | ICD-10-CM | POA: Diagnosis not present

## 2020-06-19 DIAGNOSIS — I152 Hypertension secondary to endocrine disorders: Secondary | ICD-10-CM | POA: Diagnosis not present

## 2020-06-24 ENCOUNTER — Inpatient Hospital Stay: Payer: HMO

## 2020-06-24 ENCOUNTER — Other Ambulatory Visit: Payer: Self-pay

## 2020-06-24 ENCOUNTER — Inpatient Hospital Stay: Payer: HMO | Attending: Hematology and Oncology

## 2020-06-24 VITALS — BP 143/66 | HR 64

## 2020-06-24 DIAGNOSIS — E538 Deficiency of other specified B group vitamins: Secondary | ICD-10-CM

## 2020-06-24 DIAGNOSIS — D631 Anemia in chronic kidney disease: Secondary | ICD-10-CM | POA: Diagnosis not present

## 2020-06-24 DIAGNOSIS — N183 Chronic kidney disease, stage 3 unspecified: Secondary | ICD-10-CM | POA: Diagnosis not present

## 2020-06-24 DIAGNOSIS — D509 Iron deficiency anemia, unspecified: Secondary | ICD-10-CM

## 2020-06-24 LAB — HEMATOCRIT: HCT: 29.3 % — ABNORMAL LOW (ref 39.0–52.0)

## 2020-06-24 LAB — HEMOGLOBIN: Hemoglobin: 9.3 g/dL — ABNORMAL LOW (ref 13.0–17.0)

## 2020-06-24 MED ORDER — EPOETIN ALFA-EPBX 10000 UNIT/ML IJ SOLN
10000.0000 [IU] | Freq: Once | INTRAMUSCULAR | Status: AC
  Administered 2020-06-24: 10000 [IU] via SUBCUTANEOUS
  Filled 2020-06-24: qty 1

## 2020-07-17 ENCOUNTER — Other Ambulatory Visit: Payer: Self-pay

## 2020-07-17 DIAGNOSIS — I10 Essential (primary) hypertension: Secondary | ICD-10-CM | POA: Diagnosis not present

## 2020-07-17 DIAGNOSIS — Z6825 Body mass index (BMI) 25.0-25.9, adult: Secondary | ICD-10-CM | POA: Diagnosis not present

## 2020-07-17 DIAGNOSIS — E139 Other specified diabetes mellitus without complications: Secondary | ICD-10-CM | POA: Diagnosis not present

## 2020-07-17 DIAGNOSIS — N184 Chronic kidney disease, stage 4 (severe): Secondary | ICD-10-CM | POA: Diagnosis not present

## 2020-07-17 DIAGNOSIS — D631 Anemia in chronic kidney disease: Secondary | ICD-10-CM | POA: Diagnosis not present

## 2020-07-17 DIAGNOSIS — N1832 Chronic kidney disease, stage 3b: Secondary | ICD-10-CM | POA: Diagnosis not present

## 2020-07-17 NOTE — Patient Outreach (Signed)
  [facility]) Care Management Chronic Special Needs Program    07/17/2020  Name: Zykeem, Bauserman: 1951-02-07  MRN: [id number]  [employer] care management team has assumed care and services for this member. Case Closed by [facility] care management.   Quinn Plowman RN,BSN,CCM [facility] Care Management [phone]

## 2020-07-21 NOTE — Progress Notes (Signed)
[facility]  [address] [place], [place] [address] Phone: [phone]  Fax: [phone]   Clinic Day:  07/22/2020  Referring physician: [name], MD  Chief Complaint: Joshua Cherry is a 69 y.o. male with stage III chronic kidney disease and anemia who is seen for 12 week assessment.  HPI: The patient was last seen in the hematology clinic on 04/29/2020. At that time, he was doing well.  He denied any complaints.  Exam was stable. Hematocrit was 32.1, hemoglobin 10.2, MCV 97.9, platelets 202,000, WBC 5,300. Ferritin was 82 with an iron saturation of 27% and a TIBC of 365. Folate was 26.0 and vitamin B12 was 994. He was taking oral B12. He received Retacrit 10,000 units.  Colonoscopy on 05/12/2020 by Dr. Alice Reichert revealed non-bleeding internal hemorrhoids. The remainder of the exam was normal and no specimens were collected. He was told that he does not need another colonoscopy unless clinically indicated.  Labs followed: 05/27/2020: Hematocrit 29.0. Hemoglobin 9.3. 06/24/2020: Hematocrit 29.3. Hemoglobin 9.3.  He received Retacrit 10,000 units on 05/27/2020 and 06/24/2020.  During the interim, he has been "just fine." He stated that his kidney function has worsened. He denies using any new medications and herbal products. His most recent A1C was 6.9%.   He did not notice that he lost 7 lbs since his last visit. He notes that he has been walking more. He is eating well and has not changes his diet. He denies nausea, vomiting, and diarrhea.   Past Medical History:  Diagnosis Date  . Anemia   . Chickenpox   . Chronic kidney disease   . Diabetes mellitus without complication ([place])   . GERD (gastroesophageal reflux disease)   . Heart murmur    followed by PCP  . Hypertension   . Prostate enlargement   . Torn meniscus    right    Past Surgical History:  Procedure Laterality Date  . CATARACT EXTRACTION W/PHACO Left 09/08/2016    Procedure: CATARACT EXTRACTION PHACO AND INTRAOCULAR LENS PLACEMENT (IOC);  Surgeon: Leandrew Koyanagi, MD;  Location: [facility];  Service: Ophthalmology;  Laterality: Left;  DIABETIC left  . CATARACT EXTRACTION W/PHACO Right 09/29/2016   Procedure: CATARACT EXTRACTION PHACO AND INTRAOCULAR LENS PLACEMENT ([place])  right eye;  Surgeon: Leandrew Koyanagi, MD;  Location: [facility];  Service: Ophthalmology;  Laterality: Right;  Diabetic - oral meds Right Eye IVA Topical  . COLONOSCOPY    . COLONOSCOPY    . COLONOSCOPY WITH PROPOFOL N/A 05/12/2020   Procedure: COLONOSCOPY WITH PROPOFOL;  Surgeon: Toledo, Benay Pike, MD;  Location: [facility];  Service: Gastroenterology;  Laterality: N/A;  . ESOPHAGOGASTRODUODENOSCOPY    . ESOPHAGOGASTRODUODENOSCOPY      Family History  Problem Relation Age of Onset  . Diabetes Mother   . Hypertension Mother   . Colon cancer Mother   . Hyperlipidemia Mother   . CAD Mother   . Glaucoma Mother   . Heart disease Mother   . Heart attack Mother   . Stroke Mother   . Diabetes Father   . Alzheimer's disease Father   . Hypertension Father   . Hyperlipidemia Father   . Hip fracture Father   . Diabetes Sister   . Glaucoma Sister   . Cancer Maternal Grandmother     Social History:  reports that he has never smoked. He has never used smokeless tobacco. He reports that he does not drink alcohol and does not use drugs.  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   Allergies: No Known Allergies  Current Medications: Current Outpatient Medications  Medication Sig Dispense Refill  . aspirin EC 81 MG tablet Take 81 mg by mouth daily.     . B Complex-C (B COMPLEX-VITAMIN C) CAPS Take 1 tablet by mouth daily.     . carvedilol (COREG) 3.125 MG tablet Take 3.125 mg by mouth 2 (two) times daily with a meal.     . chlorthalidone (HYGROTON) 50 MG tablet Take by mouth.    .  Cholecalciferol (VITAMIN D3) 2000 UNITS capsule Take 2,000 Units by mouth daily.     . finasteride (PROSCAR) 5 MG tablet Take 5 mg by mouth daily.     Marland Kitchen glipiZIDE (GLUCOTROL) 10 MG tablet Take by mouth. Take 1 tablet (10 mg total) by mouth 2 (two) times daily before meals    . lisinopril (PRINIVIL,ZESTRIL) 40 MG tablet Take 40 mg by mouth daily.    Marland Kitchen lovastatin (MEVACOR) 40 MG tablet Take 40 mg by mouth at bedtime.    Marland Kitchen omeprazole (PRILOSEC) 20 MG capsule Take 40 mg by mouth in the morning and at bedtime.     Glory Rosebush DELICA LANCETS 70Y MISC     . ONETOUCH ULTRA test strip USE ONCE DAILY AS DIRECTED   WHAT MACHINE  NOTHING SINCE 2018    . pioglitazone (ACTOS) 45 MG tablet Take 45 mg by mouth daily.     . sitaGLIPtin (JANUVIA) 50 MG tablet TAKE 1 TABLET BY MOUTH EVERY DAY    . spironolactone (ALDACTONE) 25 MG tablet Take 25 mg by mouth daily. Client states he is taking 1/2 tablet daily    . tamsulosin (FLOMAX) 0.4 MG CAPS capsule Take 0.4 mg by mouth daily after breakfast.     . vitamin B-12 (CYANOCOBALAMIN) 1000 MCG tablet Take 1,000 mcg by mouth daily.     No current facility-administered medications for this visit.    Review of Systems  Constitutional: Positive for weight loss (7 lbs). Negative for chills, diaphoresis, fever and malaise/fatigue.       Feels "just fine."  HENT: Negative.  Negative for congestion, ear discharge, ear pain, hearing loss, nosebleeds, sinus pain, sore throat and tinnitus.   Eyes: Negative.  Negative for blurred vision and double vision.  Respiratory: Negative.  Negative for cough, hemoptysis, sputum production and shortness of breath.   Cardiovascular: Negative.  Negative for chest pain, palpitations and leg swelling.  Gastrointestinal: Negative.  Negative for abdominal pain, blood in stool, constipation, diarrhea, heartburn, melena, nausea and vomiting.  Genitourinary: Negative.  Negative for dysuria, frequency, hematuria and urgency.  Musculoskeletal:  Negative.  Negative for back pain, joint pain, myalgias and neck pain.  Skin: Negative.  Negative for itching and rash.  Neurological: Negative.  Negative for dizziness, tingling, sensory change, speech change, weakness and headaches.  Endo/Heme/Allergies: Does not bruise/bleed easily.       Diabetes, most recent A1C 6.9%.  Psychiatric/Behavioral: Negative.  Negative for depression and memory loss. The patient is not nervous/anxious and does not have insomnia.   All other systems reviewed and are negative.  Performance status (ECOG): 0   Vitals:  Blood pressure (!) 102/57, pulse 63, temperature (!) 96.9 F (36.1 C), temperature source Tympanic, resp. rate 18, weight 169 lb 12.1 oz (77 kg), SpO2 98 %.   Physical Exam Vitals and nursing note reviewed.  Constitutional:  General: He is not in acute distress.    Appearance: He is well-developed. He is not diaphoretic.  HENT:     Head: Normocephalic and atraumatic.     Comments: Short gray hair.    Mouth/Throat:     Mouth: Mucous membranes are moist.     Pharynx: Oropharynx is clear. No oropharyngeal exudate.  Eyes:     General: No scleral icterus.    Extraocular Movements: Extraocular movements intact.     Conjunctiva/sclera: Conjunctivae normal.     Pupils: Pupils are equal, round, and reactive to light.     Comments: Blue eyes.  Cardiovascular:     Rate and Rhythm: Normal rate and regular rhythm.     Heart sounds: Normal heart sounds. No murmur heard.   Pulmonary:     Effort: Pulmonary effort is normal. No respiratory distress.     Breath sounds: Normal breath sounds. No wheezing or rales.  Chest:     Chest wall: No tenderness.  Abdominal:     General: Bowel sounds are normal. There is no distension.     Palpations: Abdomen is soft. There is no hepatomegaly, splenomegaly or mass.     Tenderness: There is no abdominal tenderness. There is no guarding or rebound.  Musculoskeletal:        General: No tenderness. Normal  range of motion.     Cervical back: Normal range of motion and neck supple.  Lymphadenopathy:     Head:     Right side of head: No preauricular, posterior auricular or occipital adenopathy.     Left side of head: No preauricular, posterior auricular or occipital adenopathy.     Cervical: No cervical adenopathy.     Upper Body:     Right upper body: No supraclavicular or axillary adenopathy.     Left upper body: No supraclavicular or axillary adenopathy.     Lower Body: No right inguinal adenopathy. No left inguinal adenopathy.  Skin:    General: Skin is warm and dry.     Findings: No erythema.  Neurological:     Mental Status: He is alert and oriented to person, place, and time.  Psychiatric:        Behavior: Behavior normal.        Thought Content: Thought content normal.        Judgment: Judgment normal.     Appointment on 07/22/2020  Component Date Value Ref Range Status  . WBC 07/22/2020 4.8  4.0 - 10.5 K/uL Final  . RBC 07/22/2020 3.39* 4.22 - 5.81 MIL/uL Final  . Hemoglobin 07/22/2020 10.8* 13.0 - 17.0 g/dL Final  . HCT 07/22/2020 33.2* 39 - 52 % Final  . MCV 07/22/2020 97.9  80.0 - 100.0 fL Final  . MCH 07/22/2020 31.9  26.0 - 34.0 pg Final  . MCHC 07/22/2020 32.5  30.0 - 36.0 g/dL Final  . RDW 07/22/2020 13.5  11.5 - 15.5 % Final  . Platelets 07/22/2020 222  150 - 400 K/uL Final  . nRBC 07/22/2020 0.0  0.0 - 0.2 % Final  . Neutrophils Relative % 07/22/2020 67  % Final  . Neutro Abs 07/22/2020 3.2  1.7 - 7.7 K/uL Final  . Lymphocytes Relative 07/22/2020 17  % Final  . Lymphs Abs 07/22/2020 0.8  0.7 - 4.0 K/uL Final  . Monocytes Relative 07/22/2020 9  % Final  . Monocytes Absolute 07/22/2020 0.4  0.1 - 1.0 K/uL Final  . Eosinophils Relative 07/22/2020 6  % Final  .  Eosinophils Absolute 07/22/2020 0.3  0.0 - 0.5 K/uL Final  . Basophils Relative 07/22/2020 1  % Final  . Basophils Absolute 07/22/2020 0.0  0.0 - 0.1 K/uL Final  . Immature Granulocytes 07/22/2020 0  %  Final  . Abs Immature Granulocytes 07/22/2020 0.02  0.00 - 0.07 K/uL Final   Performed at [facility], [address] [place], [place] [address]    Assessment:  Joshua Cherry is a 69 y.o. male with stage IIIb/IV chronic kidney diseaseand anemia. Creatinine was 2.3 on 06/19/2020.  Dietis good. He denies any melena, hematochezia, or hematuria. He denies any pica or restless legs.  Work-up in 03/2016revealed a hematocrit of 31.8, hemoglobin 10.3, MCV 95, with a normal WBC and platelet count. B12 and folate were normal. SPEP was normal on 07/09/2014. Iron studies were normal with a saturation of 18% and a TIBC of 297. Ferritin was 72. Erythropoietin was 10.6. Antinuclear antibody direct was positive with an anti-DNA double-stranded 24 (0-9). Sedimentation rate was 3. Haptoglobin was 181. Coombs was negative.   Work-up on 12/12/2018revealed a ferritin of 116. Iron saturation was 14% with a TIBC of 372. BUN was 59 with a Cr 1.98 (CrCl 33-38 ml/min). Epo level was 8.8. SPEP revealed no monoclonal protein. Retic was 1%. Urinalysis revealed no hematuria.  Ferritin was 81 on 11/15/2019.  Labs on 11/19/2019 included an iron saturation of 17% with a TIBC of 347. TSH and B12 were normal. Retic was 1.3%.  He receives Retacrit every 2 weeks (12/13/2019 - 12/27/2019) then monthly (last 06/24/2020).  Ferritin was 82 on 04/29/2020.  EGD and colonoscopy noted only reflux in 2015. Guaiac cardswere negative. Colonoscopy on 05/12/2020 revealed non-bleeding internal hemorrhoids.  He has never had a capsule study.   He was diagnosed with B12 deficiencyin 2015. He previously received monthy injections at [facility]. B12 was 994 and folate 26 on 04/29/2020.  He was a regular blood donor until approximately 2013-2014 when he was told his "iron was low".    Colonoscopy is scheduled on 05/12/2020.  The patient received both doses of the COVID-19 vaccine. His second dose was on  11/12/2019.    Symptomatically, he has felt "just fine." His kidney function has worsened. He denies using any new medications and herbal products.  He has lost 7 lbs since his last visit; he has been walking more. He is eating well and has not changed his diet. He denies nausea, vomiting, and diarrhea.  Hemoglobin is 10.8.  Plan: 1.   Labs today: CBC with diff, ferritin, iron studies. 2.   Anemia of chronic renal disease Clinically, he continues to do well. Hematocrit 33.2 and hemoglobin 9.8 today   Ferritin is 87 with an iron saturation of 27 % and a TIBC of 386.             Diet remains good.  He denies any bleeding.             Retacrit started on 12/13/2019.  Retacrit today. 3.B12 deficiency Patient no longer on B12 injections.  He is on oral B12.  B12 was 994 on 04/29/2020.  Monitor B12 and folate annually. 4.   Retacrit today. 5.   RTC every 4 weeks in [place] for labs (HCT/Hgb) and +/- Retacrit. 6.   RTC in 12 weeks for MD assessment in [place], labs (CBC with diff, ferritin, iron studies) and +/- Retacrit.  I discussed the assessment and treatment plan with the patient.  The patient was provided an  opportunity to ask questions and all were answered.  The patient agreed with the plan and demonstrated an understanding of the instructions.  The patient was advised to call back or seek an in person evaluation if the symptoms worsen or if the condition fails to improve as anticipated.   Nolon Stalls, MD, PhD  07/22/2020, 2:16 PM  I, Mirian Mo Tufford, am acting as a Education administrator for [employer]. Mike Gip, MD.   I, Destynie Toomey C. Mike Gip, MD, have reviewed the above documentation for accuracy and completeness, and I agree with the above.

## 2020-07-22 ENCOUNTER — Encounter: Payer: Self-pay | Admitting: Hematology and Oncology

## 2020-07-22 ENCOUNTER — Inpatient Hospital Stay: Payer: HMO

## 2020-07-22 ENCOUNTER — Other Ambulatory Visit: Payer: Self-pay

## 2020-07-22 ENCOUNTER — Other Ambulatory Visit: Payer: HMO

## 2020-07-22 ENCOUNTER — Inpatient Hospital Stay: Payer: HMO | Attending: Hematology and Oncology

## 2020-07-22 ENCOUNTER — Ambulatory Visit: Payer: HMO | Admitting: Hematology and Oncology

## 2020-07-22 ENCOUNTER — Ambulatory Visit: Payer: HMO

## 2020-07-22 ENCOUNTER — Inpatient Hospital Stay: Payer: HMO | Admitting: Hematology and Oncology

## 2020-07-22 VITALS — BP 102/57 | HR 63 | Temp 96.9°F | Resp 18 | Wt 169.8 lb

## 2020-07-22 DIAGNOSIS — D631 Anemia in chronic kidney disease: Secondary | ICD-10-CM | POA: Insufficient documentation

## 2020-07-22 DIAGNOSIS — E538 Deficiency of other specified B group vitamins: Secondary | ICD-10-CM | POA: Diagnosis not present

## 2020-07-22 DIAGNOSIS — N183 Chronic kidney disease, stage 3 unspecified: Secondary | ICD-10-CM

## 2020-07-22 DIAGNOSIS — D509 Iron deficiency anemia, unspecified: Secondary | ICD-10-CM

## 2020-07-22 DIAGNOSIS — R634 Abnormal weight loss: Secondary | ICD-10-CM | POA: Insufficient documentation

## 2020-07-22 LAB — CBC WITH DIFFERENTIAL/PLATELET
Abs Immature Granulocytes: 0.02 10*3/uL (ref 0.00–0.07)
Basophils Absolute: 0 10*3/uL (ref 0.0–0.1)
Basophils Relative: 1 %
Eosinophils Absolute: 0.3 10*3/uL (ref 0.0–0.5)
Eosinophils Relative: 6 %
HCT: 33.2 % — ABNORMAL LOW (ref 39.0–52.0)
Hemoglobin: 10.8 g/dL — ABNORMAL LOW (ref 13.0–17.0)
Immature Granulocytes: 0 %
Lymphocytes Relative: 17 %
Lymphs Abs: 0.8 10*3/uL (ref 0.7–4.0)
MCH: 31.9 pg (ref 26.0–34.0)
MCHC: 32.5 g/dL (ref 30.0–36.0)
MCV: 97.9 fL (ref 80.0–100.0)
Monocytes Absolute: 0.4 10*3/uL (ref 0.1–1.0)
Monocytes Relative: 9 %
Neutro Abs: 3.2 10*3/uL (ref 1.7–7.7)
Neutrophils Relative %: 67 %
Platelets: 222 10*3/uL (ref 150–400)
RBC: 3.39 MIL/uL — ABNORMAL LOW (ref 4.22–5.81)
RDW: 13.5 % (ref 11.5–15.5)
WBC: 4.8 10*3/uL (ref 4.0–10.5)
nRBC: 0 % (ref 0.0–0.2)

## 2020-07-22 LAB — IRON AND TIBC
Iron: 105 ug/dL (ref 45–182)
Saturation Ratios: 27 % (ref 17.9–39.5)
TIBC: 386 ug/dL (ref 250–450)
UIBC: 281 ug/dL

## 2020-07-22 LAB — FERRITIN: Ferritin: 87 ng/mL (ref 24–336)

## 2020-07-22 MED ORDER — EPOETIN ALFA-EPBX 10000 UNIT/ML IJ SOLN
10000.0000 [IU] | Freq: Once | INTRAMUSCULAR | Status: AC
  Administered 2020-07-22: 10000 [IU] via SUBCUTANEOUS
  Filled 2020-07-22: qty 1

## 2020-07-22 NOTE — Patient Instructions (Signed)
  Please weigh once a week and record on a piece of paper.  Notify clinic if you are losing weight.

## 2020-07-22 NOTE — Progress Notes (Signed)
Wants to know how long he will have to get injection

## 2020-08-19 ENCOUNTER — Inpatient Hospital Stay: Payer: HMO

## 2020-08-19 ENCOUNTER — Inpatient Hospital Stay: Payer: HMO | Attending: Hematology and Oncology

## 2020-08-19 VITALS — BP 135/64 | HR 59

## 2020-08-19 DIAGNOSIS — D631 Anemia in chronic kidney disease: Secondary | ICD-10-CM | POA: Diagnosis not present

## 2020-08-19 DIAGNOSIS — D509 Iron deficiency anemia, unspecified: Secondary | ICD-10-CM

## 2020-08-19 DIAGNOSIS — N183 Chronic kidney disease, stage 3 unspecified: Secondary | ICD-10-CM | POA: Insufficient documentation

## 2020-08-19 DIAGNOSIS — E538 Deficiency of other specified B group vitamins: Secondary | ICD-10-CM

## 2020-08-19 LAB — HEMATOCRIT: HCT: 31.9 % — ABNORMAL LOW (ref 39.0–52.0)

## 2020-08-19 LAB — HEMOGLOBIN: Hemoglobin: 10.3 g/dL — ABNORMAL LOW (ref 13.0–17.0)

## 2020-08-19 MED ORDER — EPOETIN ALFA-EPBX 10000 UNIT/ML IJ SOLN
10000.0000 [IU] | Freq: Once | INTRAMUSCULAR | Status: AC
  Administered 2020-08-19: 10000 [IU] via SUBCUTANEOUS
  Filled 2020-08-19: qty 1

## 2020-09-16 ENCOUNTER — Inpatient Hospital Stay: Payer: HMO

## 2020-09-16 ENCOUNTER — Inpatient Hospital Stay: Payer: HMO | Attending: Hematology and Oncology

## 2020-09-16 VITALS — BP 114/58 | HR 67

## 2020-09-16 DIAGNOSIS — E538 Deficiency of other specified B group vitamins: Secondary | ICD-10-CM

## 2020-09-16 DIAGNOSIS — D631 Anemia in chronic kidney disease: Secondary | ICD-10-CM | POA: Diagnosis not present

## 2020-09-16 DIAGNOSIS — D509 Iron deficiency anemia, unspecified: Secondary | ICD-10-CM

## 2020-09-16 DIAGNOSIS — N183 Chronic kidney disease, stage 3 unspecified: Secondary | ICD-10-CM | POA: Insufficient documentation

## 2020-09-16 LAB — HEMOGLOBIN: Hemoglobin: 9.9 g/dL — ABNORMAL LOW (ref 13.0–17.0)

## 2020-09-16 LAB — HEMATOCRIT: HCT: 30.7 % — ABNORMAL LOW (ref 39.0–52.0)

## 2020-09-16 MED ORDER — EPOETIN ALFA-EPBX 10000 UNIT/ML IJ SOLN
10000.0000 [IU] | Freq: Once | INTRAMUSCULAR | Status: AC
  Administered 2020-09-16: 10000 [IU] via SUBCUTANEOUS
  Filled 2020-09-16: qty 1

## 2020-09-27 DIAGNOSIS — W009XXA Unspecified fall due to ice and snow, initial encounter: Secondary | ICD-10-CM | POA: Diagnosis not present

## 2020-09-27 DIAGNOSIS — R0781 Pleurodynia: Secondary | ICD-10-CM | POA: Diagnosis not present

## 2020-09-27 DIAGNOSIS — S2232XA Fracture of one rib, left side, initial encounter for closed fracture: Secondary | ICD-10-CM | POA: Diagnosis not present

## 2020-09-27 DIAGNOSIS — M549 Dorsalgia, unspecified: Secondary | ICD-10-CM | POA: Diagnosis not present

## 2020-09-27 DIAGNOSIS — M79602 Pain in left arm: Secondary | ICD-10-CM | POA: Diagnosis not present

## 2020-10-13 NOTE — Progress Notes (Signed)
[facility]  [address], [address] [place], [place] [address] Phone: [phone]  Fax: [phone]   Clinic Day:  10/14/2020  Referring physician: [name], MD  Chief Complaint: Joshua Cherry is a 70 y.o. male with stage III chronic kidney disease and anemia who is seen for 12 week assessment.  HPI: The patient was last seen in the hematology clinic on 07/22/2020. At that time, he felt "just fine." His kidney function had worsened. He denied using any new medications and herbal products.  He had lost 7 lbs since his last visit; he had been walking more. He was eating well and had not changed his diet. He denied nausea, vomiting, and diarrhea. Hematocrit was 33.2, hemoglobin 10.8, MCV 97.9, platelets 222,000, WBC 4,800. Ferritin was 87 with an iron saturation of 27% and a TIBC of 386. He received Retacrit 10,000 units. He continued oral iron.  Labs followed: 08/19/2020: Hematocrit 31.9. Hemoglobin 10.3. 09/16/2020: Hematocrit 30.7. Hemoglobin   9.9.  He received Retacrit 10,000 units on 08/19/2020 and 09/16/2020.  During the interim, he has been "okay I guess." He slipped and fell on ice last month and fractured a left rib. The pain has improved. He wears a brace. His diet is good. He denies bleeding of any kind, shortness of breath, and palpitations.  His blood sugar was running high for a while but is now back to normal. His weight fluctuates.  He is not taking oral iron at this time.  He is scheduled to see Dr. Smith Mince on 11/28/2020.   Past Medical History:  Diagnosis Date  . Anemia   . Chickenpox   . Chronic kidney disease   . Diabetes mellitus without complication ([place])   . GERD (gastroesophageal reflux disease)   . Heart murmur    followed by PCP  . Hypertension   . Prostate enlargement   . Torn meniscus    right    Past Surgical History:  Procedure Laterality Date  . CATARACT EXTRACTION W/PHACO Left 09/08/2016   Procedure:  CATARACT EXTRACTION PHACO AND INTRAOCULAR LENS PLACEMENT (IOC);  Surgeon: Leandrew Koyanagi, MD;  Location: [facility];  Service: Ophthalmology;  Laterality: Left;  DIABETIC left  . CATARACT EXTRACTION W/PHACO Right 09/29/2016   Procedure: CATARACT EXTRACTION PHACO AND INTRAOCULAR LENS PLACEMENT ([place])  right eye;  Surgeon: Leandrew Koyanagi, MD;  Location: [facility];  Service: Ophthalmology;  Laterality: Right;  Diabetic - oral meds Right Eye IVA Topical  . COLONOSCOPY    . COLONOSCOPY    . COLONOSCOPY WITH PROPOFOL N/A 05/12/2020   Procedure: COLONOSCOPY WITH PROPOFOL;  Surgeon: Toledo, Benay Pike, MD;  Location: [facility];  Service: Gastroenterology;  Laterality: N/A;  . ESOPHAGOGASTRODUODENOSCOPY    . ESOPHAGOGASTRODUODENOSCOPY      Family History  Problem Relation Age of Onset  . Diabetes Mother   . Hypertension Mother   . Colon cancer Mother   . Hyperlipidemia Mother   . CAD Mother   . Glaucoma Mother   . Heart disease Mother   . Heart attack Mother   . Stroke Mother   . Diabetes Father   . Alzheimer's disease Father   . Hypertension Father   . Hyperlipidemia Father   . Hip fracture Father   . Diabetes Sister   . Glaucoma Sister   . Cancer Maternal Grandmother     Social History:  reports that he has never smoked. He has never used smokeless tobacco. He reports that he does not  drink alcohol and does not use drugs. 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   Allergies: No Known Allergies  Current Medications: Current Outpatient Medications  Medication Sig Dispense Refill  . aspirin EC 81 MG tablet Take 81 mg by mouth daily.     . B Complex-C (B COMPLEX-VITAMIN C) CAPS Take 1 tablet by mouth daily.     . carvedilol (COREG) 3.125 MG tablet Take 3.125 mg by mouth 2 (two) times daily with a meal.     . chlorthalidone (HYGROTON) 50 MG tablet Take by mouth.    . Cholecalciferol  (VITAMIN D3) 2000 UNITS capsule Take 2,000 Units by mouth daily.     . finasteride (PROSCAR) 5 MG tablet Take 5 mg by mouth daily.     Marland Kitchen glipiZIDE (GLUCOTROL) 10 MG tablet Take by mouth. Take 1 tablet (10 mg total) by mouth 2 (two) times daily before meals    . lisinopril (PRINIVIL,ZESTRIL) 40 MG tablet Take 40 mg by mouth daily.    Marland Kitchen lovastatin (MEVACOR) 40 MG tablet Take 40 mg by mouth at bedtime.    Marland Kitchen omeprazole (PRILOSEC) 20 MG capsule Take 40 mg by mouth in the morning and at bedtime.     Glory Rosebush DELICA LANCETS [id number] MISC     . ONETOUCH ULTRA test strip USE ONCE DAILY AS DIRECTED   WHAT MACHINE  NOTHING SINCE 2018    . pioglitazone (ACTOS) 45 MG tablet Take 45 mg by mouth daily.     . sitaGLIPtin (JANUVIA) 50 MG tablet TAKE 1 TABLET BY MOUTH EVERY DAY    . spironolactone (ALDACTONE) 25 MG tablet Take 25 mg by mouth daily. Client states he is taking 1/2 tablet daily    . tamsulosin (FLOMAX) 0.4 MG CAPS capsule Take 0.4 mg by mouth daily after breakfast.     . vitamin B-12 (CYANOCOBALAMIN) 1000 MCG tablet Take 1,000 mcg by mouth daily.     No current facility-administered medications for this visit.    Review of Systems  Constitutional: Negative for chills, diaphoresis, fever, malaise/fatigue and weight loss (up 7 lbs).       Feels "okay I guess."  HENT: Negative.  Negative for congestion, ear discharge, ear pain, hearing loss, nosebleeds, sinus pain, sore throat and tinnitus.   Eyes: Negative.  Negative for blurred vision and double vision.  Respiratory: Negative.  Negative for cough, hemoptysis, sputum production and shortness of breath.   Cardiovascular: Negative.  Negative for chest pain, palpitations and leg swelling.  Gastrointestinal: Negative.  Negative for abdominal pain, blood in stool, constipation, diarrhea, heartburn, melena, nausea and vomiting.  Genitourinary: Negative.  Negative for dysuria, frequency, hematuria and urgency.  Musculoskeletal: Negative for back pain,  joint pain, myalgias and neck pain.       Left rib pain.  Skin: Negative.  Negative for itching and rash.  Neurological: Negative.  Negative for dizziness, tingling, sensory change, speech change, weakness and headaches.  Endo/Heme/Allergies: Does not bruise/bleed easily.       Diabetes.  Psychiatric/Behavioral: Negative.  Negative for depression and memory loss. The patient is not nervous/anxious and does not have insomnia.   All other systems reviewed and are negative.  Performance status (ECOG): 0   Vitals:  Blood pressure (!) 146/52, pulse 69, temperature (!) 96.6 F (35.9 C), temperature source Tympanic, resp. rate 16, weight 176 lb 14.7 oz (80.3 kg), SpO2 99 %.   Physical Exam Vitals  and nursing note reviewed.  Constitutional:      General: He is not in acute distress.    Appearance: He is well-developed. He is not diaphoretic.  HENT:     Head: Normocephalic and atraumatic.     Comments: Short gray hair.    Mouth/Throat:     Mouth: Mucous membranes are moist.     Pharynx: Oropharynx is clear. No oropharyngeal exudate.  Eyes:     General: No scleral icterus.    Extraocular Movements: Extraocular movements intact.     Conjunctiva/sclera: Conjunctivae normal.     Pupils: Pupils are equal, round, and reactive to light.     Comments: Blue eyes.  Cardiovascular:     Rate and Rhythm: Normal rate and regular rhythm.     Heart sounds: Normal heart sounds. No murmur heard.   Pulmonary:     Effort: Pulmonary effort is normal. No respiratory distress.     Breath sounds: Normal breath sounds. No wheezing or rales.  Chest:     Chest wall: No tenderness.  Breasts:     Right: No axillary adenopathy or supraclavicular adenopathy.     Left: No axillary adenopathy or supraclavicular adenopathy.    Abdominal:     General: Bowel sounds are normal. There is no distension.     Palpations: Abdomen is soft. There is no hepatomegaly, splenomegaly or mass.     Tenderness: There is no  abdominal tenderness. There is guarding (mild, due to rib pain). There is no rebound.  Musculoskeletal:        General: No tenderness. Normal range of motion.     Cervical back: Normal range of motion and neck supple.  Lymphadenopathy:     Head:     Right side of head: No preauricular, posterior auricular or occipital adenopathy.     Left side of head: No preauricular, posterior auricular or occipital adenopathy.     Cervical: No cervical adenopathy.     Upper Body:     Right upper body: No supraclavicular or axillary adenopathy.     Left upper body: No supraclavicular or axillary adenopathy.     Lower Body: No right inguinal adenopathy. No left inguinal adenopathy.  Skin:    General: Skin is warm and dry.     Findings: No erythema.  Neurological:     Mental Status: He is alert and oriented to person, place, and time.  Psychiatric:        Behavior: Behavior normal.        Thought Content: Thought content normal.        Judgment: Judgment normal.    Appointment on 10/14/2020  Component Date Value Ref Range Status  . WBC 10/14/2020 4.8  4.0 - 10.5 K/uL Final  . RBC 10/14/2020 3.08* 4.22 - 5.81 MIL/uL Final  . Hemoglobin 10/14/2020 9.6* 13.0 - 17.0 g/dL Final  . HCT 10/14/2020 30.3* 39.0 - 52.0 % Final  . MCV 10/14/2020 98.4  80.0 - 100.0 fL Final  . MCH 10/14/2020 31.2  26.0 - 34.0 pg Final  . MCHC 10/14/2020 31.7  30.0 - 36.0 g/dL Final  . RDW 10/14/2020 13.8  11.5 - 15.5 % Final  . Platelets 10/14/2020 263  150 - 400 K/uL Final  . nRBC 10/14/2020 0.0  0.0 - 0.2 % Final  . Neutrophils Relative % 10/14/2020 54  % Final  . Neutro Abs 10/14/2020 2.6  1.7 - 7.7 K/uL Final  . Lymphocytes Relative 10/14/2020 25  % Final  .  Lymphs Abs 10/14/2020 1.2  0.7 - 4.0 K/uL Final  . Monocytes Relative 10/14/2020 14  % Final  . Monocytes Absolute 10/14/2020 0.7  0.1 - 1.0 K/uL Final  . Eosinophils Relative 10/14/2020 6  % Final  . Eosinophils Absolute 10/14/2020 0.3  0.0 - 0.5 K/uL Final  .  Basophils Relative 10/14/2020 1  % Final  . Basophils Absolute 10/14/2020 0.0  0.0 - 0.1 K/uL Final  . Immature Granulocytes 10/14/2020 0  % Final  . Abs Immature Granulocytes 10/14/2020 0.02  0.00 - 0.07 K/uL Final   Performed at [facility], [address], [place], [place] [address]    Assessment:  Joshua Cherry is a 70 y.o. male with stage IIIb/IV chronic kidney diseaseand anemia. Creatinine was 2.3 on 06/19/2020.  Dietis good. He denies any melena, hematochezia, or hematuria. He denies any pica or restless legs.  Work-up in 03/2016revealed a hematocrit of 31.8, hemoglobin 10.3, MCV 95, with a normal WBC and platelet count. B12 and folate were normal. SPEP was normal on 07/09/2014. Iron studies were normal with a saturation of 18% and a TIBC of 297. Ferritin was 72. Erythropoietin was 10.6. Antinuclear antibody direct was positive with an anti-DNA double-stranded 24 (0-9). Sedimentation rate was 3. Haptoglobin was 181. Coombs was negative.   Work-up on 12/12/2018revealed a ferritin of 116. Iron saturation was 14% with a TIBC of 372. BUN was 59 with a Cr 1.98 (CrCl 33-38 ml/min). Epo level was 8.8. SPEP revealed no monoclonal protein. Retic was 1%. Urinalysis revealed no hematuria.  Ferritin was 81 on 11/15/2019.  Labs on 11/19/2019 included an iron saturation of 17% with a TIBC of 347. TSH and B12 were normal. Retic was 1.3%.  He receives Retacrit every 2 weeks (12/13/2019 - 12/27/2019) then monthly (last 09/16/2020).  Ferritin was 87 on 07/22/2020.  EGD and colonoscopy noted only reflux in 2015. Guaiac cardswere negative. Colonoscopy on 05/12/2020 revealed non-bleeding internal hemorrhoids.  He has never had a capsule study.   He was diagnosed with B12 deficiencyin 2015. He previously received monthy injections at [facility]. B12 was 994 and folate 26 on 04/29/2020.  He was a regular blood donor until approximately 2013-2014 when he was told his "iron  was low".    Colonoscopy is scheduled on 05/12/2020.  The patient received both doses of the COVID-19 vaccine. His second dose was on 11/12/2019.    Symptomatically, he has been "ok." He slipped and fell on ice last month and fractured a left rib. He wears a brace. His diet is good. He denies bleeding of any kind, shortness of breath, and palpitations.  Exam is stable.  Plan: 1.   Labs today: CBC with diff, ferritin, iron studies. 2.   Anemia of chronic renal disease Clinically, he is doing well Hematocrit 30.3 and hemoglobin 8.6 today   Ferritin 91 with an iron saturation of 19% and a TIBC of 305.             Diet is good.  He denies any bleeding.             Retacrit started on 12/13/2019.  Retacrit today and every 4 weeks per protocol. 3.B12 deficiency Patient no longer on B12 injections.  He is on oral B12.  B12 was 994 on 04/29/2020.  Monitor B12 and folate annually. 4.   Retacrit today. 5.   RTC every 4 weeks in [place] for labs (HCT/Hgb) and +/- Retacrit. 6.   RTC in 12 weeks for  MD assess in [place], labs (CBC with diff, ferritin, iron studies) and +/- Retacrit.  I discussed the assessment and treatment plan with the patient.  The patient was provided an opportunity to ask questions and all were answered.  The patient agreed with the plan and demonstrated an understanding of the instructions.  The patient was advised to call back or seek an in person evaluation if the symptoms worsen or if the condition fails to improve as anticipated.   Nolon Stalls, MD, PhD  10/14/2020, 1:50 PM  I, Mirian Mo Tufford, am acting as a Education administrator for [employer]. Mike Gip, MD.   I, Bunny Lowdermilk C. Mike Gip, MD, have reviewed the above documentation for accuracy and completeness, and I agree with the above.

## 2020-10-14 ENCOUNTER — Encounter: Payer: Self-pay | Admitting: Hematology and Oncology

## 2020-10-14 ENCOUNTER — Inpatient Hospital Stay: Payer: HMO | Attending: Hematology and Oncology | Admitting: Hematology and Oncology

## 2020-10-14 ENCOUNTER — Inpatient Hospital Stay: Payer: HMO

## 2020-10-14 ENCOUNTER — Other Ambulatory Visit: Payer: Self-pay

## 2020-10-14 VITALS — BP 146/52 | HR 69 | Temp 96.6°F | Resp 16 | Wt 176.9 lb

## 2020-10-14 DIAGNOSIS — D631 Anemia in chronic kidney disease: Secondary | ICD-10-CM | POA: Diagnosis not present

## 2020-10-14 DIAGNOSIS — E538 Deficiency of other specified B group vitamins: Secondary | ICD-10-CM | POA: Diagnosis not present

## 2020-10-14 DIAGNOSIS — D509 Iron deficiency anemia, unspecified: Secondary | ICD-10-CM

## 2020-10-14 DIAGNOSIS — N183 Chronic kidney disease, stage 3 unspecified: Secondary | ICD-10-CM | POA: Diagnosis not present

## 2020-10-14 LAB — CBC WITH DIFFERENTIAL/PLATELET
Abs Immature Granulocytes: 0.02 10*3/uL (ref 0.00–0.07)
Basophils Absolute: 0 10*3/uL (ref 0.0–0.1)
Basophils Relative: 1 %
Eosinophils Absolute: 0.3 10*3/uL (ref 0.0–0.5)
Eosinophils Relative: 6 %
HCT: 30.3 % — ABNORMAL LOW (ref 39.0–52.0)
Hemoglobin: 9.6 g/dL — ABNORMAL LOW (ref 13.0–17.0)
Immature Granulocytes: 0 %
Lymphocytes Relative: 25 %
Lymphs Abs: 1.2 10*3/uL (ref 0.7–4.0)
MCH: 31.2 pg (ref 26.0–34.0)
MCHC: 31.7 g/dL (ref 30.0–36.0)
MCV: 98.4 fL (ref 80.0–100.0)
Monocytes Absolute: 0.7 10*3/uL (ref 0.1–1.0)
Monocytes Relative: 14 %
Neutro Abs: 2.6 10*3/uL (ref 1.7–7.7)
Neutrophils Relative %: 54 %
Platelets: 263 10*3/uL (ref 150–400)
RBC: 3.08 MIL/uL — ABNORMAL LOW (ref 4.22–5.81)
RDW: 13.8 % (ref 11.5–15.5)
WBC: 4.8 10*3/uL (ref 4.0–10.5)
nRBC: 0 % (ref 0.0–0.2)

## 2020-10-14 LAB — IRON AND TIBC
Iron: 69 ug/dL (ref 45–182)
Saturation Ratios: 19 % (ref 17.9–39.5)
TIBC: 374 ug/dL (ref 250–450)
UIBC: 305 ug/dL

## 2020-10-14 LAB — FERRITIN: Ferritin: 91 ng/mL (ref 24–336)

## 2020-10-14 MED ORDER — EPOETIN ALFA-EPBX 10000 UNIT/ML IJ SOLN
10000.0000 [IU] | Freq: Once | INTRAMUSCULAR | Status: AC
  Administered 2020-10-14: 10000 [IU] via SUBCUTANEOUS

## 2020-10-14 MED ORDER — EPOETIN ALFA-EPBX 10000 UNIT/ML IJ SOLN
INTRAMUSCULAR | Status: AC
  Filled 2020-10-14: qty 1

## 2020-10-14 NOTE — Progress Notes (Signed)
Patient states he slipped and  fell on some ice less than a month ago and fractured his rib.

## 2020-10-21 ENCOUNTER — Ambulatory Visit: Payer: Self-pay

## 2020-10-27 DIAGNOSIS — E113293 Type 2 diabetes mellitus with mild nonproliferative diabetic retinopathy without macular edema, bilateral: Secondary | ICD-10-CM | POA: Diagnosis not present

## 2020-11-11 ENCOUNTER — Inpatient Hospital Stay: Payer: HMO | Attending: Hematology and Oncology

## 2020-11-11 ENCOUNTER — Inpatient Hospital Stay: Payer: HMO

## 2020-11-11 VITALS — BP 138/68 | HR 66

## 2020-11-11 DIAGNOSIS — D509 Iron deficiency anemia, unspecified: Secondary | ICD-10-CM

## 2020-11-11 DIAGNOSIS — D631 Anemia in chronic kidney disease: Secondary | ICD-10-CM

## 2020-11-11 DIAGNOSIS — E538 Deficiency of other specified B group vitamins: Secondary | ICD-10-CM

## 2020-11-11 DIAGNOSIS — N183 Chronic kidney disease, stage 3 unspecified: Secondary | ICD-10-CM | POA: Insufficient documentation

## 2020-11-11 LAB — HEMATOCRIT: HCT: 31.1 % — ABNORMAL LOW (ref 39.0–52.0)

## 2020-11-11 LAB — HEMOGLOBIN: Hemoglobin: 10.2 g/dL — ABNORMAL LOW (ref 13.0–17.0)

## 2020-11-11 MED ORDER — EPOETIN ALFA-EPBX 10000 UNIT/ML IJ SOLN
10000.0000 [IU] | Freq: Once | INTRAMUSCULAR | Status: AC
  Administered 2020-11-11: 10000 [IU] via SUBCUTANEOUS
  Filled 2020-11-11: qty 1

## 2020-11-27 DIAGNOSIS — I10 Essential (primary) hypertension: Secondary | ICD-10-CM | POA: Diagnosis not present

## 2020-11-27 DIAGNOSIS — D631 Anemia in chronic kidney disease: Secondary | ICD-10-CM | POA: Diagnosis not present

## 2020-11-27 DIAGNOSIS — E538 Deficiency of other specified B group vitamins: Secondary | ICD-10-CM | POA: Diagnosis not present

## 2020-11-27 DIAGNOSIS — N401 Enlarged prostate with lower urinary tract symptoms: Secondary | ICD-10-CM | POA: Diagnosis not present

## 2020-11-27 DIAGNOSIS — E1121 Type 2 diabetes mellitus with diabetic nephropathy: Secondary | ICD-10-CM | POA: Diagnosis not present

## 2020-11-27 DIAGNOSIS — Z0000 Encounter for general adult medical examination without abnormal findings: Secondary | ICD-10-CM | POA: Diagnosis not present

## 2020-11-27 DIAGNOSIS — N1832 Chronic kidney disease, stage 3b: Secondary | ICD-10-CM | POA: Diagnosis not present

## 2020-11-27 DIAGNOSIS — K219 Gastro-esophageal reflux disease without esophagitis: Secondary | ICD-10-CM | POA: Diagnosis not present

## 2020-11-27 DIAGNOSIS — E785 Hyperlipidemia, unspecified: Secondary | ICD-10-CM | POA: Diagnosis not present

## 2020-12-09 ENCOUNTER — Inpatient Hospital Stay: Payer: HMO

## 2020-12-16 ENCOUNTER — Inpatient Hospital Stay: Payer: HMO | Attending: Hematology and Oncology

## 2020-12-16 ENCOUNTER — Inpatient Hospital Stay: Payer: HMO

## 2020-12-16 VITALS — BP 132/56 | HR 67

## 2020-12-16 DIAGNOSIS — D631 Anemia in chronic kidney disease: Secondary | ICD-10-CM | POA: Diagnosis not present

## 2020-12-16 DIAGNOSIS — D509 Iron deficiency anemia, unspecified: Secondary | ICD-10-CM

## 2020-12-16 DIAGNOSIS — N189 Chronic kidney disease, unspecified: Secondary | ICD-10-CM | POA: Insufficient documentation

## 2020-12-16 DIAGNOSIS — N183 Chronic kidney disease, stage 3 unspecified: Secondary | ICD-10-CM

## 2020-12-16 DIAGNOSIS — E538 Deficiency of other specified B group vitamins: Secondary | ICD-10-CM

## 2020-12-16 LAB — HEMOGLOBIN: Hemoglobin: 9.6 g/dL — ABNORMAL LOW (ref 13.0–17.0)

## 2020-12-16 LAB — HEMATOCRIT: HCT: 29.4 % — ABNORMAL LOW (ref 39.0–52.0)

## 2020-12-16 MED ORDER — EPOETIN ALFA-EPBX 10000 UNIT/ML IJ SOLN
10000.0000 [IU] | Freq: Once | INTRAMUSCULAR | Status: AC
  Administered 2020-12-16: 10000 [IU] via SUBCUTANEOUS
  Filled 2020-12-16: qty 1

## 2020-12-24 DIAGNOSIS — I10 Essential (primary) hypertension: Secondary | ICD-10-CM | POA: Diagnosis not present

## 2020-12-24 DIAGNOSIS — E139 Other specified diabetes mellitus without complications: Secondary | ICD-10-CM | POA: Diagnosis not present

## 2021-01-07 ENCOUNTER — Other Ambulatory Visit: Payer: Self-pay

## 2021-01-07 DIAGNOSIS — D631 Anemia in chronic kidney disease: Secondary | ICD-10-CM

## 2021-01-07 DIAGNOSIS — D509 Iron deficiency anemia, unspecified: Secondary | ICD-10-CM

## 2021-01-07 DIAGNOSIS — E538 Deficiency of other specified B group vitamins: Secondary | ICD-10-CM

## 2021-01-08 ENCOUNTER — Other Ambulatory Visit: Payer: Self-pay

## 2021-01-08 ENCOUNTER — Inpatient Hospital Stay: Payer: HMO

## 2021-01-08 ENCOUNTER — Encounter: Payer: Self-pay | Admitting: Nurse Practitioner

## 2021-01-08 ENCOUNTER — Inpatient Hospital Stay: Payer: HMO | Attending: Nurse Practitioner

## 2021-01-08 ENCOUNTER — Inpatient Hospital Stay (HOSPITAL_BASED_OUTPATIENT_CLINIC_OR_DEPARTMENT_OTHER): Payer: HMO | Admitting: Nurse Practitioner

## 2021-01-08 VITALS — BP 124/59 | HR 67 | Temp 97.9°F | Resp 16 | Wt 187.5 lb

## 2021-01-08 DIAGNOSIS — D631 Anemia in chronic kidney disease: Secondary | ICD-10-CM

## 2021-01-08 DIAGNOSIS — N1832 Chronic kidney disease, stage 3b: Secondary | ICD-10-CM | POA: Insufficient documentation

## 2021-01-08 DIAGNOSIS — Z79899 Other long term (current) drug therapy: Secondary | ICD-10-CM | POA: Insufficient documentation

## 2021-01-08 DIAGNOSIS — N183 Chronic kidney disease, stage 3 unspecified: Secondary | ICD-10-CM | POA: Diagnosis not present

## 2021-01-08 DIAGNOSIS — E538 Deficiency of other specified B group vitamins: Secondary | ICD-10-CM | POA: Diagnosis not present

## 2021-01-08 DIAGNOSIS — Z7982 Long term (current) use of aspirin: Secondary | ICD-10-CM | POA: Diagnosis not present

## 2021-01-08 DIAGNOSIS — Z7984 Long term (current) use of oral hypoglycemic drugs: Secondary | ICD-10-CM | POA: Insufficient documentation

## 2021-01-08 DIAGNOSIS — D509 Iron deficiency anemia, unspecified: Secondary | ICD-10-CM

## 2021-01-08 LAB — IRON AND TIBC
Iron: 75 ug/dL (ref 45–182)
Saturation Ratios: 22 % (ref 17.9–39.5)
TIBC: 337 ug/dL (ref 250–450)
UIBC: 262 ug/dL

## 2021-01-08 LAB — FERRITIN: Ferritin: 70 ng/mL (ref 24–336)

## 2021-01-08 LAB — CBC WITH DIFFERENTIAL/PLATELET
Abs Immature Granulocytes: 0.1 10*3/uL — ABNORMAL HIGH (ref 0.00–0.07)
Basophils Absolute: 0 10*3/uL (ref 0.0–0.1)
Basophils Relative: 0 %
Eosinophils Absolute: 0.4 10*3/uL (ref 0.0–0.5)
Eosinophils Relative: 5 %
HCT: 30.6 % — ABNORMAL LOW (ref 39.0–52.0)
Hemoglobin: 10 g/dL — ABNORMAL LOW (ref 13.0–17.0)
Immature Granulocytes: 1 %
Lymphocytes Relative: 12 %
Lymphs Abs: 0.9 10*3/uL (ref 0.7–4.0)
MCH: 31.4 pg (ref 26.0–34.0)
MCHC: 32.7 g/dL (ref 30.0–36.0)
MCV: 96.2 fL (ref 80.0–100.0)
Monocytes Absolute: 0.7 10*3/uL (ref 0.1–1.0)
Monocytes Relative: 9 %
Neutro Abs: 5.2 10*3/uL (ref 1.7–7.7)
Neutrophils Relative %: 73 %
Platelets: 253 10*3/uL (ref 150–400)
RBC: 3.18 MIL/uL — ABNORMAL LOW (ref 4.22–5.81)
RDW: 13.5 % (ref 11.5–15.5)
WBC: 7.2 10*3/uL (ref 4.0–10.5)
nRBC: 0 % (ref 0.0–0.2)

## 2021-01-08 MED ORDER — EPOETIN ALFA-EPBX 10000 UNIT/ML IJ SOLN
10000.0000 [IU] | Freq: Once | INTRAMUSCULAR | Status: AC
  Administered 2021-01-08: 10000 [IU] via SUBCUTANEOUS

## 2021-01-08 NOTE — Progress Notes (Signed)
[facility]  [address] [place], [name] [address] Phone: [phone]  Fax: [phone]   Clinic Day:  01/08/2021  Referring physician: [name], MD  Chief Complaint:  stage III chronic kidney disease and anemia  Hematology History: Joshua Cherry is a 70 y.o. male with stage IIIb/IV chronic kidney diseaseand anemia. Creatinine was 2.3 on 06/19/2020.  Dietis good. He denies any melena, hematochezia, or hematuria. He denies any pica or restless legs.  Work-up in 03/2016revealed a hematocrit of 31.8, hemoglobin 10.3, MCV 95, with a normal WBC and platelet count. B12 and folate were normal. SPEP was normal on 07/09/2014. Iron studies were normal with a saturation of 18% and a TIBC of 297. Ferritin was 72. Erythropoietin was 10.6. Antinuclear antibody direct was positive with an anti-DNA double-stranded 24 (0-9). Sedimentation rate was 3. Haptoglobin was 181. Coombs was negative.   Work-up on 12/12/2018revealed a ferritin of 116. Iron saturation was 14% with a TIBC of 372. BUN was 59 with a Cr 1.98 (CrCl 33-38 ml/min). Epo level was 8.8. SPEP revealed no monoclonal protein. Retic was 1%. Urinalysis revealed no hematuria.  Ferritin was 81 on 11/15/2019.  Labs on 11/19/2019 included an iron saturation of 17% with a TIBC of 347. TSH and B12 were normal. Retic was 1.3%.  He receives Retacrit every 2 weeks (12/13/2019 - 12/27/2019) then monthly (last 09/16/2020).  Ferritin was 87 on 07/22/2020.  EGD and colonoscopy noted only reflux in 2015. Guaiac cardswere negative. Colonoscopy on 05/12/2020 revealed non-bleeding internal hemorrhoids.  He has never had a capsule study.   He was diagnosed with B12 deficiencyin 2015. He previously received monthy injections at [facility]. B12 was 994 and folate 26 on 04/29/2020.  He was a regular blood donor until approximately 2013-2014 when he was told his "iron was low".    Colonoscopy is  scheduled on 05/12/2020.  The patient received both doses of the COVID-19 vaccine. His second dose was on 11/12/2019.  HPI: Patient is 70 year old male with above history of anemia of chronic kidney disease who returns to clinic for labs, reevaluation, and consideration of Retacrit.  He has been receiving monthly Retacrit.  Says his kidney numbers have been up and down.  Feels about the same.  No fatigue or weakness.  No intervals infections.  No shortness of breath or chest pain.  No nausea, vomiting, constipation, or diarrhea.  No urinary complaints.  No other specific complaints today.  Past Medical History:  Diagnosis Date  . Anemia   . Chickenpox   . Chronic kidney disease   . Diabetes mellitus without complication ([place])   . GERD (gastroesophageal reflux disease)   . Heart murmur    followed by PCP  . Hypertension   . Prostate enlargement   . Torn meniscus    right    Past Surgical History:  Procedure Laterality Date  . CATARACT EXTRACTION W/PHACO Left 09/08/2016   Procedure: CATARACT EXTRACTION PHACO AND INTRAOCULAR LENS PLACEMENT ([facility]);  Surgeon: Leandrew Koyanagi, MD;  Location: [facility];  Service: Ophthalmology;  Laterality: Left;  DIABETIC left  . CATARACT EXTRACTION W/PHACO Right 09/29/2016   Procedure: CATARACT EXTRACTION PHACO AND INTRAOCULAR LENS PLACEMENT ([place])  right eye;  Surgeon: Leandrew Koyanagi, MD;  Location: [facility];  Service: Ophthalmology;  Laterality: Right;  Diabetic - oral meds Right Eye IVA Topical  . COLONOSCOPY    . COLONOSCOPY    . COLONOSCOPY WITH PROPOFOL N/A 05/12/2020   Procedure: COLONOSCOPY WITH  PROPOFOL;  Surgeon: Toledo, Benay Pike, MD;  Location: [facility];  Service: Gastroenterology;  Laterality: N/A;  . ESOPHAGOGASTRODUODENOSCOPY    . ESOPHAGOGASTRODUODENOSCOPY      Family History  Problem Relation Age of Onset  . Diabetes Mother   . Hypertension Mother   . Colon cancer Mother   . Hyperlipidemia Mother    . CAD Mother   . Glaucoma Mother   . Heart disease Mother   . Heart attack Mother   . Stroke Mother   . Diabetes Father   . Alzheimer's disease Father   . Hypertension Father   . Hyperlipidemia Father   . Hip fracture Father   . Diabetes Sister   . Glaucoma Sister   . Cancer Maternal Grandmother     Social History:  reports that he has never smoked. He has never used smokeless tobacco. He reports that he does not drink alcohol and does not use drugs. 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   Allergies: No Known Allergies  Current Medications: Current Outpatient Medications  Medication Sig Dispense Refill  . aspirin EC 81 MG tablet Take 81 mg by mouth daily.     . B Complex-C (B COMPLEX-VITAMIN C) CAPS Take 1 tablet by mouth daily.     . carvedilol (COREG) 3.125 MG tablet Take 3.125 mg by mouth 2 (two) times daily with a meal.     . chlorthalidone (HYGROTON) 50 MG tablet Take by mouth.    . Cholecalciferol (VITAMIN D3) 2000 UNITS capsule Take 2,000 Units by mouth daily.     . finasteride (PROSCAR) 5 MG tablet Take 5 mg by mouth daily.     Marland Kitchen glipiZIDE (GLUCOTROL) 10 MG tablet Take by mouth. Take 1 tablet (10 mg total) by mouth 2 (two) times daily before meals    . lisinopril (PRINIVIL,ZESTRIL) 40 MG tablet Take 40 mg by mouth daily.    Marland Kitchen lovastatin (MEVACOR) 40 MG tablet Take 40 mg by mouth at bedtime.    Marland Kitchen omeprazole (PRILOSEC) 20 MG capsule Take 40 mg by mouth in the morning and at bedtime.     Glory Rosebush DELICA LANCETS [id number] MISC     . ONETOUCH ULTRA test strip USE ONCE DAILY AS DIRECTED   WHAT MACHINE  NOTHING SINCE 2018    . pioglitazone (ACTOS) 45 MG tablet Take 45 mg by mouth daily.     . sitaGLIPtin (JANUVIA) 50 MG tablet TAKE 1 TABLET BY MOUTH EVERY DAY    . spironolactone (ALDACTONE) 25 MG tablet Take 25 mg by mouth daily. Client states he is taking 1/2 tablet daily    . tamsulosin (FLOMAX) 0.4 MG  CAPS capsule Take 0.4 mg by mouth daily after breakfast.     . vitamin B-12 (CYANOCOBALAMIN) 1000 MCG tablet Take 1,000 mcg by mouth daily.     No current facility-administered medications for this visit.    Review of Systems  Constitutional: Negative for chills, fever, malaise/fatigue and weight loss.  HENT: Negative for hearing loss, nosebleeds, sore throat and tinnitus.   Eyes: Negative for blurred vision and double vision.  Respiratory: Negative for cough, hemoptysis, shortness of breath and wheezing.   Cardiovascular: Negative for chest pain, palpitations and leg swelling.  Gastrointestinal: Negative for abdominal pain, blood in stool, constipation, diarrhea, melena, nausea and vomiting.  Genitourinary: Negative for dysuria and urgency.  Musculoskeletal: Negative for back pain, falls, joint pain and myalgias.  Skin:  Negative for itching and rash.  Neurological: Negative for dizziness, tingling, sensory change, loss of consciousness, weakness and headaches.  Endo/Heme/Allergies: Negative for environmental allergies. Does not bruise/bleed easily.  Psychiatric/Behavioral: Negative for depression. The patient is not nervous/anxious and does not have insomnia.    Performance status (ECOG): 0   Vitals:  Blood pressure (!) 124/59, pulse 67, temperature 97.9 F (36.6 C), resp. rate 16, weight 187 lb 8 oz (85.1 kg), SpO2 98 %.  General: Well-developed, well-nourished, no acute distress. Eyes: Pink conjunctiva, anicteric sclera. Lungs: No audible wheezing or coughing Heart: Regular rate and rhythm.  Abdomen: Soft, nontender, nondistended.  Musculoskeletal: No edema, cyanosis, or clubbing. Neuro: Alert, answering all questions appropriately.  Skin: No rashes or petechiae noted. Psych: Normal affect.   Appointment on 01/08/2021  Component Date Value Ref Range Status  . WBC 01/08/2021 7.2  4.0 - 10.5 K/uL Final  . RBC 01/08/2021 3.18* 4.22 - 5.81 MIL/uL Final  . Hemoglobin 01/08/2021  10.0* 13.0 - 17.0 g/dL Final  . HCT 01/08/2021 30.6* 39.0 - 52.0 % Final  . MCV 01/08/2021 96.2  80.0 - 100.0 fL Final  . MCH 01/08/2021 31.4  26.0 - 34.0 pg Final  . MCHC 01/08/2021 32.7  30.0 - 36.0 g/dL Final  . RDW 01/08/2021 13.5  11.5 - 15.5 % Final  . Platelets 01/08/2021 253  150 - 400 K/uL Final  . nRBC 01/08/2021 0.0  0.0 - 0.2 % Final  . Neutrophils Relative % 01/08/2021 73  % Final  . Neutro Abs 01/08/2021 5.2  1.7 - 7.7 K/uL Final  . Lymphocytes Relative 01/08/2021 12  % Final  . Lymphs Abs 01/08/2021 0.9  0.7 - 4.0 K/uL Final  . Monocytes Relative 01/08/2021 9  % Final  . Monocytes Absolute 01/08/2021 0.7  0.1 - 1.0 K/uL Final  . Eosinophils Relative 01/08/2021 5  % Final  . Eosinophils Absolute 01/08/2021 0.4  0.0 - 0.5 K/uL Final  . Basophils Relative 01/08/2021 0  % Final  . Basophils Absolute 01/08/2021 0.0  0.0 - 0.1 K/uL Final  . Immature Granulocytes 01/08/2021 1  % Final  . Abs Immature Granulocytes 01/08/2021 0.10* 0.00 - 0.07 K/uL Final   Performed at [facility], [address], [place], [place] [address]    Assessment:      1.   Anemia of chronic renal disease- stage IIIb/IV CKD. Previous workup consistent with anemia of ckd. Tolerating well without significant side effects. No history of vte. Goal hemoglobin 11. Today, 10. Stable.BP stable.  Proceed with 10,000 units of retacrit today. Iron studies pending at time of dictation. Can consider increasing dose by 25% or moving to q3 week or q2 week dosing at next appointment if hemoglobin doesn't increase.   2. B12 deficiency- continue oral b12. Recheck in August 2022.   3. Diabetes- HbA1c is not useful as a lgycemic indicator in patients treated with epoetin alfa. Other monitoring paremeters may be more useful.    Clinically, he is doing well Hematocrit 30.3 and hemoglobin 8.6 today   Ferritin 91 with an iron saturation of 19% and a TIBC of 305. Plan:  Retacrit  today rtc every 4 weeks for labs (h&H) and +/- retacrit RTC in 12 weeks for labs (cbc, ferritin, iron studies), md/np, +/- retacrit Patient lives in [place] and wishes to transfer his care to that location.   I discussed the assessment and treatment plan with the patient.  The patient was provided an opportunity to  ask questions and all were answered.  The patient agreed with the plan and demonstrated an understanding of the instructions.  The patient was advised to call back or seek an in person evaluation if the symptoms worsen or if the condition fails to improve as anticipated.  Beckey Rutter, DNP, AGNP-C [place] at [facility] [phone] (clinic)

## 2021-02-05 ENCOUNTER — Inpatient Hospital Stay: Payer: HMO

## 2021-02-05 ENCOUNTER — Inpatient Hospital Stay: Payer: HMO | Attending: Oncology

## 2021-02-05 ENCOUNTER — Other Ambulatory Visit: Payer: Self-pay

## 2021-02-05 VITALS — BP 128/62 | HR 61

## 2021-02-05 DIAGNOSIS — D631 Anemia in chronic kidney disease: Secondary | ICD-10-CM | POA: Diagnosis not present

## 2021-02-05 DIAGNOSIS — D509 Iron deficiency anemia, unspecified: Secondary | ICD-10-CM

## 2021-02-05 DIAGNOSIS — N183 Chronic kidney disease, stage 3 unspecified: Secondary | ICD-10-CM | POA: Insufficient documentation

## 2021-02-05 LAB — HEMOGLOBIN AND HEMATOCRIT, BLOOD
HCT: 28.9 % — ABNORMAL LOW (ref 39.0–52.0)
Hemoglobin: 9.5 g/dL — ABNORMAL LOW (ref 13.0–17.0)

## 2021-02-05 MED ORDER — EPOETIN ALFA-EPBX 10000 UNIT/ML IJ SOLN
10000.0000 [IU] | Freq: Once | INTRAMUSCULAR | Status: AC
  Administered 2021-02-05: 10000 [IU] via SUBCUTANEOUS
  Filled 2021-02-05: qty 1

## 2021-03-04 ENCOUNTER — Other Ambulatory Visit: Payer: Self-pay

## 2021-03-04 DIAGNOSIS — E785 Hyperlipidemia, unspecified: Secondary | ICD-10-CM | POA: Diagnosis not present

## 2021-03-04 DIAGNOSIS — E1159 Type 2 diabetes mellitus with other circulatory complications: Secondary | ICD-10-CM | POA: Diagnosis not present

## 2021-03-04 DIAGNOSIS — E1122 Type 2 diabetes mellitus with diabetic chronic kidney disease: Secondary | ICD-10-CM | POA: Diagnosis not present

## 2021-03-04 DIAGNOSIS — E1169 Type 2 diabetes mellitus with other specified complication: Secondary | ICD-10-CM | POA: Diagnosis not present

## 2021-03-04 DIAGNOSIS — D509 Iron deficiency anemia, unspecified: Secondary | ICD-10-CM

## 2021-03-04 DIAGNOSIS — I152 Hypertension secondary to endocrine disorders: Secondary | ICD-10-CM | POA: Diagnosis not present

## 2021-03-04 DIAGNOSIS — N183 Chronic kidney disease, stage 3 unspecified: Secondary | ICD-10-CM | POA: Diagnosis not present

## 2021-03-05 ENCOUNTER — Inpatient Hospital Stay: Payer: HMO

## 2021-03-05 ENCOUNTER — Inpatient Hospital Stay: Payer: HMO | Attending: Oncology

## 2021-03-05 ENCOUNTER — Other Ambulatory Visit: Payer: Self-pay

## 2021-03-05 VITALS — BP 142/60 | HR 66

## 2021-03-05 DIAGNOSIS — D509 Iron deficiency anemia, unspecified: Secondary | ICD-10-CM

## 2021-03-05 DIAGNOSIS — N183 Chronic kidney disease, stage 3 unspecified: Secondary | ICD-10-CM | POA: Insufficient documentation

## 2021-03-05 DIAGNOSIS — D631 Anemia in chronic kidney disease: Secondary | ICD-10-CM | POA: Diagnosis not present

## 2021-03-05 LAB — HEMOGLOBIN AND HEMATOCRIT, BLOOD
HCT: 30 % — ABNORMAL LOW (ref 39.0–52.0)
Hemoglobin: 9.6 g/dL — ABNORMAL LOW (ref 13.0–17.0)

## 2021-03-05 MED ORDER — EPOETIN ALFA-EPBX 10000 UNIT/ML IJ SOLN
10000.0000 [IU] | Freq: Once | INTRAMUSCULAR | Status: AC
  Administered 2021-03-05: 10000 [IU] via SUBCUTANEOUS
  Filled 2021-03-05: qty 1

## 2021-04-01 ENCOUNTER — Inpatient Hospital Stay: Payer: HMO | Admitting: Oncology

## 2021-04-01 ENCOUNTER — Inpatient Hospital Stay: Payer: HMO | Attending: Oncology

## 2021-04-01 ENCOUNTER — Encounter: Payer: Self-pay | Admitting: Oncology

## 2021-04-01 ENCOUNTER — Inpatient Hospital Stay: Payer: HMO

## 2021-04-01 VITALS — BP 128/56 | HR 64 | Temp 98.3°F | Resp 20 | Wt 187.5 lb

## 2021-04-01 DIAGNOSIS — N183 Chronic kidney disease, stage 3 unspecified: Secondary | ICD-10-CM

## 2021-04-01 DIAGNOSIS — Z79899 Other long term (current) drug therapy: Secondary | ICD-10-CM | POA: Diagnosis not present

## 2021-04-01 DIAGNOSIS — D509 Iron deficiency anemia, unspecified: Secondary | ICD-10-CM

## 2021-04-01 DIAGNOSIS — D631 Anemia in chronic kidney disease: Secondary | ICD-10-CM

## 2021-04-01 DIAGNOSIS — E538 Deficiency of other specified B group vitamins: Secondary | ICD-10-CM | POA: Insufficient documentation

## 2021-04-01 DIAGNOSIS — Z7982 Long term (current) use of aspirin: Secondary | ICD-10-CM | POA: Insufficient documentation

## 2021-04-01 LAB — CBC WITH DIFFERENTIAL/PLATELET
Abs Immature Granulocytes: 0.02 10*3/uL (ref 0.00–0.07)
Basophils Absolute: 0 10*3/uL (ref 0.0–0.1)
Basophils Relative: 1 %
Eosinophils Absolute: 0.5 10*3/uL (ref 0.0–0.5)
Eosinophils Relative: 9 %
HCT: 30.9 % — ABNORMAL LOW (ref 39.0–52.0)
Hemoglobin: 10 g/dL — ABNORMAL LOW (ref 13.0–17.0)
Immature Granulocytes: 0 %
Lymphocytes Relative: 19 %
Lymphs Abs: 1.1 10*3/uL (ref 0.7–4.0)
MCH: 32.2 pg (ref 26.0–34.0)
MCHC: 32.4 g/dL (ref 30.0–36.0)
MCV: 99.4 fL (ref 80.0–100.0)
Monocytes Absolute: 0.7 10*3/uL (ref 0.1–1.0)
Monocytes Relative: 12 %
Neutro Abs: 3.6 10*3/uL (ref 1.7–7.7)
Neutrophils Relative %: 59 %
Platelets: 202 10*3/uL (ref 150–400)
RBC: 3.11 MIL/uL — ABNORMAL LOW (ref 4.22–5.81)
RDW: 14.3 % (ref 11.5–15.5)
WBC: 6 10*3/uL (ref 4.0–10.5)
nRBC: 0 % (ref 0.0–0.2)

## 2021-04-01 LAB — COMPREHENSIVE METABOLIC PANEL
ALT: 16 U/L (ref 0–44)
AST: 20 U/L (ref 15–41)
Albumin: 4 g/dL (ref 3.5–5.0)
Alkaline Phosphatase: 52 U/L (ref 38–126)
Anion gap: 7 (ref 5–15)
BUN: 52 mg/dL — ABNORMAL HIGH (ref 8–23)
CO2: 20 mmol/L — ABNORMAL LOW (ref 22–32)
Calcium: 8.8 mg/dL — ABNORMAL LOW (ref 8.9–10.3)
Chloride: 109 mmol/L (ref 98–111)
Creatinine, Ser: 2.04 mg/dL — ABNORMAL HIGH (ref 0.61–1.24)
GFR, Estimated: 34 mL/min — ABNORMAL LOW (ref >60)
Glucose, Bld: 104 mg/dL — ABNORMAL HIGH (ref 70–99)
Potassium: 5.2 mmol/L — ABNORMAL HIGH (ref 3.5–5.1)
Sodium: 136 mmol/L (ref 135–145)
Total Bilirubin: 0.5 mg/dL (ref 0.3–1.2)
Total Protein: 6.9 g/dL (ref 6.5–8.1)

## 2021-04-01 LAB — IRON AND TIBC
Iron: 95 ug/dL (ref 45–182)
Saturation Ratios: 26 % (ref 17.9–39.5)
TIBC: 371 ug/dL (ref 250–450)
UIBC: 276 ug/dL

## 2021-04-01 LAB — FERRITIN: Ferritin: 62 ng/mL (ref 24–336)

## 2021-04-02 ENCOUNTER — Encounter: Payer: Self-pay | Admitting: Urgent Care

## 2021-04-03 ENCOUNTER — Encounter: Payer: Self-pay | Admitting: Urgent Care

## 2021-04-03 NOTE — Progress Notes (Signed)
[facility]  [address] [place], [place] [address] Phone: [phone]  Fax: [phone]   Clinic Day:  04/03/2021  Referring physician: [name], MD  Chief Complaint: Joshua Cherry is a 70 y.o. male presents for anemia due to  stage III chronic kidney disease.  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    Work-up in 10/2014 revealed a hematocrit of 31.8, hemoglobin 10.3, MCV 95, with a normal WBC and platelet count.  B12 and folate were normal.  SPEP was normal on 07/09/2014.  Iron studies were normal with a saturation of 18% and a TIBC of 297. Ferritin was 72.  Erythropoietin was 10.6.  Antinuclear antibody direct was positive with an anti-DNA double-stranded 24 (0-9).  Sedimentation rate was 3.  Haptoglobin was 181. Coombs was negative.    Work-up on 08/10/2017 revealed a ferritin of 116.  Iron saturation was 14% with a TIBC of 372.  BUN was 59 with a Cr 1.98 (CrCl 33-38 ml/min).  Epo level was 8.8.  SPEP revealed no monoclonal protein.  Retic was 1%.  Urinalysis revealed no hematuria.  Ferritin was 81 on 11/15/2019.  Labs on 11/19/2019 included an iron saturation of 17% with a TIBC of 347. TSH and B12 were normal. Retic was 1.3%.  He receives Retacrit every 2 weeks (12/13/2019 - 12/27/2019) then monthly   # EGD and colonoscopy noted only reflux in 2015.  Guaiac cards were negative.  Colonoscopy on 05/12/2020 revealed non-bleeding internal hemorrhoids.  He has never had a capsule study.     # 2015  B12 deficiency .    INTERVAL HISTORY Joshua Cherry is a 70 y.o. male who has above history reviewed by me today presents for follow up visit for management of anemia of CKD He reports feeling well today. No new complaints.   Review of Systems  Constitutional:  Negative for appetite  change, chills, fatigue, fever and unexpected weight change.  HENT:   Negative for hearing loss and voice change.   Eyes:  Negative for eye problems and icterus.  Respiratory:  Negative for chest tightness, cough and shortness of breath.   Cardiovascular:  Negative for chest pain and leg swelling.  Gastrointestinal:  Negative for abdominal distention and abdominal pain.  Endocrine: Negative for hot flashes.  Genitourinary:  Negative for difficulty urinating, dysuria and frequency.   Musculoskeletal:  Negative for arthralgias.  Skin:  Negative for itching and rash.  Neurological:  Negative for light-headedness and numbness.  Hematological:  Negative for adenopathy. Does not bruise/bleed easily.  Psychiatric/Behavioral:  Negative for confusion.       Past Medical History:  Diagnosis Date   Anemia    Chickenpox    Chronic kidney disease    Diabetes mellitus without complication (HCC)    GERD (gastroesophageal reflux disease)    Heart murmur    followed by PCP   Hypertension    Prostate enlargement    Torn meniscus    right    Past Surgical History:  Procedure Laterality Date   CATARACT EXTRACTION W/PHACO Left 09/08/2016   Procedure: CATARACT EXTRACTION PHACO AND INTRAOCULAR LENS PLACEMENT ([place]);  Surgeon: Leandrew Koyanagi, MD;  Location: [place];  Service: Ophthalmology;  Laterality: Left;  DIABETIC left   CATARACT EXTRACTION W/PHACO Right 09/29/2016   Procedure: CATARACT EXTRACTION PHACO AND INTRAOCULAR LENS  PLACEMENT ([place])  right eye;  Surgeon: Leandrew Koyanagi, MD;  Location: [place];  Service: Ophthalmology;  Laterality: Right;  Diabetic - oral meds Right Eye IVA Topical   COLONOSCOPY     COLONOSCOPY     COLONOSCOPY WITH PROPOFOL N/A 05/12/2020   Procedure: COLONOSCOPY WITH PROPOFOL;  Surgeon: Toledo, Benay Pike, MD;  Location: [facility];  Service: Gastroenterology;  Laterality: N/A;   ESOPHAGOGASTRODUODENOSCOPY      ESOPHAGOGASTRODUODENOSCOPY      Family History  Problem Relation Age of Onset   Diabetes Mother    Hypertension Mother    Colon cancer Mother    Hyperlipidemia Mother    CAD Mother    Glaucoma Mother    Heart disease Mother    Heart attack Mother    Stroke Mother    Diabetes Father    Alzheimer's disease Father    Hypertension Father    Hyperlipidemia Father    Hip fracture Father    Diabetes Sister    Glaucoma Sister    Cancer Maternal Grandmother     Social History:  reports that he has never smoked. He has never used smokeless tobacco. He reports that he does not drink alcohol and does not use drugs. 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 The patient is alone today.   Allergies: No Known Allergies  Current Medications: Current Outpatient Medications  Medication Sig Dispense Refill   aspirin EC 81 MG tablet Take 81 mg by mouth daily.      B Complex-C (B COMPLEX-VITAMIN C) CAPS Take 1 tablet by mouth daily.      carvedilol (COREG) 3.125 MG tablet Take 3.125 mg by mouth 2 (two) times daily with a meal.      chlorthalidone (HYGROTON) 50 MG tablet Take by mouth.     Cholecalciferol (VITAMIN D3) 2000 UNITS capsule Take 2,000 Units by mouth daily.      finasteride (PROSCAR) 5 MG tablet Take 5 mg by mouth daily.      glipiZIDE (GLUCOTROL) 10 MG tablet Take by mouth. Take 1 tablet (10 mg total) by mouth 2 (two) times daily before meals     lisinopril (PRINIVIL,ZESTRIL) 40 MG tablet Take 40 mg by mouth daily.     lovastatin (MEVACOR) 40 MG tablet Take 40 mg by mouth at bedtime.     omeprazole (PRILOSEC) 20 MG capsule Take 40 mg by mouth in the morning and at bedtime.      ONETOUCH DELICA LANCETS [id number] MISC      ONETOUCH ULTRA test strip USE ONCE DAILY AS DIRECTED   WHAT MACHINE  NOTHING SINCE 2018     pioglitazone (ACTOS) 45 MG tablet Take 45 mg by mouth daily.      sitaGLIPtin (JANUVIA) 50 MG tablet TAKE 1 TABLET BY MOUTH EVERY DAY      spironolactone (ALDACTONE) 25 MG tablet Take 25 mg by mouth daily. Client states he is taking 1/2 tablet daily     tamsulosin (FLOMAX) 0.4 MG CAPS capsule Take 0.4 mg by mouth daily after breakfast.      vitamin B-12 (CYANOCOBALAMIN) 1000 MCG tablet Take 1,000 mcg by mouth daily.     No current facility-administered medications for this visit.     Performance status (ECOG): 0   Vitals:  Blood pressure (!) 128/56, pulse 64, temperature 98.3 F (36.8 C), temperature source Tympanic, resp. rate 20, weight 187 lb 8 oz (85 kg), SpO2 98 %.  Physical  Exam Constitutional:      General: He is not in acute distress.    Appearance: He is not diaphoretic.  HENT:     Head: Normocephalic and atraumatic.     Nose: Nose normal.     Mouth/Throat:     Pharynx: No oropharyngeal exudate.  Eyes:     General: No scleral icterus.    Pupils: Pupils are equal, round, and reactive to light.  Cardiovascular:     Rate and Rhythm: Normal rate and regular rhythm.     Heart sounds: No murmur heard. Pulmonary:     Effort: Pulmonary effort is normal. No respiratory distress.     Breath sounds: No rales.  Chest:     Chest wall: No tenderness.  Abdominal:     General: There is no distension.     Palpations: Abdomen is soft.     Tenderness: There is no abdominal tenderness.  Musculoskeletal:        General: Normal range of motion.     Cervical back: Normal range of motion and neck supple.  Skin:    General: Skin is warm and dry.     Findings: No erythema.  Neurological:     Mental Status: He is alert and oriented to person, place, and time.     Cranial Nerves: No cranial nerve deficit.     Motor: No abnormal muscle tone.     Coordination: Coordination normal.  Psychiatric:        Mood and Affect: Affect normal.      Assessment and Plan:  1. Iron deficiency anemia, unspecified iron deficiency anemia type   2. Anemia of chronic kidney failure, stage 3 (moderate) (HCC)    #  Anemia of chronic  renal disease Labs are reviewed and discussed with patient. Hb is 10, hold retacrit.  Iron panel showed ferritin of 62, iron saturation of 26. Recommend to further increase iron store, Ferritin goal is around 200.  Plan IV iron with Venofer '200mg'$  weekly x 2 doses. Allergy reactions/infusion reaction including anaphylactic reaction discussed with patient. Other side effects include but not limited to high blood pressure, skin rash, weight gain, leg swelling, etc. Patient voices understanding and willing to proceed.  # History of B12 deficiency Continue oral B12 supplementation. Check B12 at next visit.   Follow up Venofer  weekly x2  Lab/retacrit (HH) in 4 weeks & 8 weeks  Labs/MD/ retacrit in 12 weeks (cbc,iron feritin)   I discussed the assessment and treatment plan with the patient.  The patient was provided an opportunity to ask questions and all were answered.  The patient agreed with the plan and demonstrated an understanding of the instructions.  The patient was advised to call back or seek an in person evaluation if the symptoms worsen or if the condition fails to improve as anticipated.   Earlie Server, MD, PhD Hematology Oncology [place] [facility] 04/03/2021

## 2021-04-07 ENCOUNTER — Inpatient Hospital Stay: Payer: HMO

## 2021-04-14 ENCOUNTER — Inpatient Hospital Stay: Payer: HMO

## 2021-04-14 VITALS — BP 138/57 | HR 59 | Temp 96.8°F | Resp 18

## 2021-04-14 DIAGNOSIS — D509 Iron deficiency anemia, unspecified: Secondary | ICD-10-CM

## 2021-04-14 MED ORDER — SODIUM CHLORIDE 0.9 % IV SOLN: 200.0000 mg | Freq: Once | INTRAVENOUS | Status: DC

## 2021-04-14 MED ORDER — SODIUM CHLORIDE 0.9 % IV SOLN
Freq: Once | INTRAVENOUS | Status: AC
  Filled 2021-04-14: qty 250

## 2021-04-14 MED ORDER — IRON SUCROSE 20 MG/ML IV SOLN
200.0000 mg | Freq: Once | INTRAVENOUS | Status: AC
Start: 2021-04-14 — End: 2021-04-14
  Administered 2021-04-14: 200 mg via INTRAVENOUS
  Filled 2021-04-14: qty 10

## 2021-04-14 NOTE — Patient Instructions (Signed)
[facility] [place] [facility]  Discharge Instructions: Thank you for choosing [facility] to provide your oncology and hematology care.  If you have a lab appointment with the Cancer Center, please go directly to the Cancer Center and check in at the registration area.  Wear comfortable clothing and clothing appropriate for easy access to any Portacath or PICC line.   We strive to give you quality time with your provider. You may need to reschedule your appointment if you arrive late (15 or more minutes).  Arriving late affects you and other patients whose appointments are after yours.  Also, if you miss three or more appointments without notifying the office, you may be dismissed from the clinic at the provider's discretion.      For prescription refill requests, have your pharmacy contact our office and allow 72 hours for refills to be completed.    Today you received the following chemotherapy and/or immunotherapy agents VENOFER      To help prevent nausea and vomiting after your treatment, we encourage you to take your nausea medication as directed.  BELOW ARE SYMPTOMS THAT SHOULD BE REPORTED IMMEDIATELY: *FEVER GREATER THAN 100.4 F (38 C) OR HIGHER *CHILLS OR SWEATING *NAUSEA AND VOMITING THAT IS NOT CONTROLLED WITH YOUR NAUSEA MEDICATION *UNUSUAL SHORTNESS OF BREATH *UNUSUAL BRUISING OR BLEEDING *URINARY PROBLEMS (pain or burning when urinating, or frequent urination) *BOWEL PROBLEMS (unusual diarrhea, constipation, pain near the anus) TENDERNESS IN MOUTH AND THROAT WITH OR WITHOUT PRESENCE OF ULCERS (sore throat, sores in mouth, or a toothache) UNUSUAL RASH, SWELLING OR PAIN  UNUSUAL VAGINAL DISCHARGE OR ITCHING   Items with * indicate a potential emergency and should be followed up as soon as possible or go to the Emergency Department if any problems should occur.  Please show the CHEMOTHERAPY ALERT CARD or IMMUNOTHERAPY ALERT CARD at check-in to  the Emergency Department and triage nurse.  Should you have questions after your visit or need to cancel or reschedule your appointment, please contact [facility] [place] [facility]  [phone] and follow the prompts.  Office hours are 8:00 a.m. to 4:30 p.m. Monday - Friday. Please note that voicemails left after 4:00 p.m. may not be returned until the following business day.  We are closed weekends and major holidays. You have access to a nurse at all times for urgent questions. Please call the main number to the clinic [phone] and follow the prompts.  For any non-urgent questions, you may also contact your provider using MyChart. We now offer e-Visits for anyone 18 and older to request care online for non-urgent symptoms. For details visit [email or web address].   Also download the MyChart app! Go to the app store, search "MyChart", open the app, select [facility], and log in with your MyChart username and password.  Due to Covid, a mask is required upon entering the hospital/clinic. If you do not have a mask, one will be given to you upon arrival. For doctor visits, patients may have 1 support person aged 18 or older with them. For treatment visits, patients cannot have anyone with them due to current Covid guidelines and our immunocompromised population.   Iron Sucrose injection What is this medication? IRON SUCROSE (AHY ern SOO krohs) is an iron complex. Iron is used to make healthy red blood cells, which carry oxygen and nutrients throughout the body. This medicine is used to treat iron deficiency anemia in people with chronickidney disease. This medicine may be used for other   purposes; ask your health care provider orpharmacist if you have questions. COMMON BRAND NAME(S): Venofer What should I tell my care team before I take this medication? They need to know if you have any of these conditions: anemia not caused by low iron levels heart disease high levels of  iron in the blood kidney disease liver disease an unusual or allergic reaction to iron, other medicines, foods, dyes, or preservatives pregnant or trying to get pregnant breast-feeding How should I use this medication? This medicine is for infusion into a vein. It is given by a health careprofessional in a hospital or clinic setting. Talk to your pediatrician regarding the use of this medicine in children. While this drug may be prescribed for children as young as 2 years for selectedconditions, precautions do apply. Overdosage: If you think you have taken too much of this medicine contact apoison control center or emergency room at once. NOTE: This medicine is only for you. Do not share this medicine with others. What if I miss a dose? It is important not to miss your dose. Call your doctor or health careprofessional if you are unable to keep an appointment. What may interact with this medication? Do not take this medicine with any of the following medications: deferoxamine dimercaprol other iron products This medicine may also interact with the following medications: chloramphenicol deferasirox This list may not describe all possible interactions. Give your health care provider a list of all the medicines, herbs, non-prescription drugs, or dietary supplements you use. Also tell them if you smoke, drink alcohol, or use illegaldrugs. Some items may interact with your medicine. What should I watch for while using this medication? Visit your doctor or healthcare professional regularly. Tell your doctor or healthcare professional if your symptoms do not start to get better or if theyget worse. You may need blood work done while you are taking this medicine. You may need to follow a special diet. Talk to your doctor. Foods that contain iron include: whole grains/cereals, dried fruits, beans, or peas, leafy greenvegetables, and organ meats (liver, kidney). What side effects may I notice from  receiving this medication? Side effects that you should report to your doctor or health care professionalas soon as possible: allergic reactions like skin rash, itching or hives, swelling of the face, lips, or tongue breathing problems changes in blood pressure cough fast, irregular heartbeat feeling faint or lightheaded, falls fever or chills flushing, sweating, or hot feelings joint or muscle aches/pains seizures swelling of the ankles or feet unusually weak or tired Side effects that usually do not require medical attention (report to yourdoctor or health care professional if they continue or are bothersome): diarrhea feeling achy headache irritation at site where injected nausea, vomiting stomach upset tiredness This list may not describe all possible side effects. Call your doctor for medical advice about side effects. You may report side effects to FDA at1-800-FDA-1088. Where should I keep my medication? This drug is given in a hospital or clinic and will not be stored at home. NOTE: This sheet is a summary. It may not cover all possible information. If you have questions about this medicine, talk to your doctor, pharmacist, orhealth care provider.  2022 Elsevier/Gold Standard (2011-05-27 17:14:35)  

## 2021-04-21 ENCOUNTER — Inpatient Hospital Stay: Payer: HMO

## 2021-04-21 VITALS — BP 131/57 | HR 63 | Temp 97.4°F

## 2021-04-21 DIAGNOSIS — D509 Iron deficiency anemia, unspecified: Secondary | ICD-10-CM

## 2021-04-21 MED ORDER — SODIUM CHLORIDE 0.9 % IV SOLN: 200.0000 mg | Freq: Once | INTRAVENOUS | Status: DC

## 2021-04-21 MED ORDER — IRON SUCROSE 20 MG/ML IV SOLN
200.0000 mg | Freq: Once | INTRAVENOUS | Status: AC
  Administered 2021-04-21: 200 mg via INTRAVENOUS
  Filled 2021-04-21: qty 10

## 2021-04-21 MED ORDER — SODIUM CHLORIDE 0.9 % IV SOLN
Freq: Once | INTRAVENOUS | Status: AC
  Filled 2021-04-21: qty 250

## 2021-04-21 NOTE — Patient Instructions (Signed)

## 2021-04-29 ENCOUNTER — Inpatient Hospital Stay: Payer: HMO

## 2021-05-05 ENCOUNTER — Other Ambulatory Visit: Payer: Self-pay

## 2021-05-05 ENCOUNTER — Inpatient Hospital Stay: Payer: HMO

## 2021-05-05 ENCOUNTER — Inpatient Hospital Stay: Payer: HMO | Attending: Oncology

## 2021-05-05 DIAGNOSIS — D509 Iron deficiency anemia, unspecified: Secondary | ICD-10-CM | POA: Diagnosis not present

## 2021-05-05 LAB — HEMOGLOBIN AND HEMATOCRIT, BLOOD
HCT: 32.5 % — ABNORMAL LOW (ref 39.0–52.0)
Hemoglobin: 10.5 g/dL — ABNORMAL LOW (ref 13.0–17.0)

## 2021-05-21 DIAGNOSIS — R051 Acute cough: Secondary | ICD-10-CM | POA: Diagnosis not present

## 2021-05-25 ENCOUNTER — Ambulatory Visit: Payer: Self-pay | Admitting: Medical

## 2021-05-25 ENCOUNTER — Encounter: Payer: Self-pay | Admitting: Urgent Care

## 2021-05-25 ENCOUNTER — Other Ambulatory Visit: Payer: Self-pay

## 2021-05-25 DIAGNOSIS — Z7189 Other specified counseling: Secondary | ICD-10-CM

## 2021-05-25 DIAGNOSIS — U071 COVID-19: Secondary | ICD-10-CM

## 2021-05-25 NOTE — Progress Notes (Signed)
Patient called clinic questions about returning to work. [place] [employer] told patient he could return today 05/25/21. Marland KitchenHe had a positive Covid-19 test on 05/20/2021.  He has still been testing himself to get a negative Covid-19 test before returning to work.  I explained to the patient that he will test positive for the next  90 days. And if all his symptoms are improving and no fever x 24 hours with no  OTC Tylenol or Motrin being taken he may return to work. He will check his temperature prior to returning to work. He is planning on returning tonight at  10pm. He is aware that he has to wear a mask for the next  5 days.   He says he never ran a fever but is on Tylenol for his sore throat.  He feels all his symptoms are improving. He still has a mild cough ( none noted on phone call) but much improved. Some HA but improved.   He was treated with  Molnupiravir by his doctor.  He was vaccinated with [employer].  I recommended that if patient starts to work and then feels ill he is to contact our office or his PCP. He verbalizes understanding and has no questions at the end of our phone call.

## 2021-05-27 ENCOUNTER — Inpatient Hospital Stay: Payer: HMO

## 2021-06-02 ENCOUNTER — Inpatient Hospital Stay: Payer: HMO

## 2021-06-02 ENCOUNTER — Inpatient Hospital Stay: Payer: HMO | Attending: Oncology

## 2021-06-02 VITALS — BP 145/67 | HR 69

## 2021-06-02 DIAGNOSIS — N183 Chronic kidney disease, stage 3 unspecified: Secondary | ICD-10-CM | POA: Insufficient documentation

## 2021-06-02 DIAGNOSIS — E611 Iron deficiency: Secondary | ICD-10-CM | POA: Diagnosis not present

## 2021-06-02 DIAGNOSIS — D509 Iron deficiency anemia, unspecified: Secondary | ICD-10-CM

## 2021-06-02 DIAGNOSIS — D631 Anemia in chronic kidney disease: Secondary | ICD-10-CM | POA: Diagnosis not present

## 2021-06-02 LAB — HEMOGLOBIN AND HEMATOCRIT, BLOOD
HCT: 28.9 % — ABNORMAL LOW (ref 39.0–52.0)
Hemoglobin: 9.4 g/dL — ABNORMAL LOW (ref 13.0–17.0)

## 2021-06-02 MED ORDER — EPOETIN ALFA-EPBX 10000 UNIT/ML IJ SOLN
10000.0000 [IU] | Freq: Once | INTRAMUSCULAR | Status: AC
  Administered 2021-06-02: 10000 [IU] via SUBCUTANEOUS
  Filled 2021-06-02: qty 1

## 2021-06-05 DIAGNOSIS — E1121 Type 2 diabetes mellitus with diabetic nephropathy: Secondary | ICD-10-CM | POA: Diagnosis not present

## 2021-06-05 DIAGNOSIS — M152 Bouchard's nodes (with arthropathy): Secondary | ICD-10-CM | POA: Diagnosis not present

## 2021-06-05 DIAGNOSIS — Z23 Encounter for immunization: Secondary | ICD-10-CM | POA: Diagnosis not present

## 2021-06-05 DIAGNOSIS — E538 Deficiency of other specified B group vitamins: Secondary | ICD-10-CM | POA: Diagnosis not present

## 2021-06-05 DIAGNOSIS — D631 Anemia in chronic kidney disease: Secondary | ICD-10-CM | POA: Diagnosis not present

## 2021-06-05 DIAGNOSIS — I10 Essential (primary) hypertension: Secondary | ICD-10-CM | POA: Diagnosis not present

## 2021-06-05 DIAGNOSIS — N1832 Chronic kidney disease, stage 3b: Secondary | ICD-10-CM | POA: Diagnosis not present

## 2021-06-05 DIAGNOSIS — K219 Gastro-esophageal reflux disease without esophagitis: Secondary | ICD-10-CM | POA: Diagnosis not present

## 2021-06-05 DIAGNOSIS — N401 Enlarged prostate with lower urinary tract symptoms: Secondary | ICD-10-CM | POA: Diagnosis not present

## 2021-06-05 DIAGNOSIS — E785 Hyperlipidemia, unspecified: Secondary | ICD-10-CM | POA: Diagnosis not present

## 2021-06-15 DIAGNOSIS — N1832 Chronic kidney disease, stage 3b: Secondary | ICD-10-CM | POA: Diagnosis not present

## 2021-06-15 DIAGNOSIS — N184 Chronic kidney disease, stage 4 (severe): Secondary | ICD-10-CM | POA: Diagnosis not present

## 2021-06-15 DIAGNOSIS — I10 Essential (primary) hypertension: Secondary | ICD-10-CM | POA: Diagnosis not present

## 2021-06-15 DIAGNOSIS — D631 Anemia in chronic kidney disease: Secondary | ICD-10-CM | POA: Diagnosis not present

## 2021-06-16 ENCOUNTER — Encounter: Payer: Self-pay | Admitting: Urgent Care

## 2021-06-16 NOTE — Progress Notes (Signed)
06/17/21 8:54 AM   Joshua Cherry August 19, 1951 [id number]  Referring provider:  [name], MD [place],  [place] [address] Chief Complaint  Patient presents with   Benign Prostatic Hypertrophy     HPI: Joshua Cherry is a 70 y.o.male who presents for further evaluation of BPH with LUTS.   He was previously follow by Dr. [name] but due to insurance changes was referred to [facility] urology.   He is currently taking tamsulosin and finasteride for his BPH.   He reports that he has been on tamsulosin and finasteride for approximately 6 years. He has not had a recent PSA drawn.   He reports that he is considering outlet procedure in the past but does not know much about them.  Urinary symptoms today as below.   IPSS     Row Name 06/17/21 0800         International Prostate Symptom Score   How often have you had the sensation of not emptying your bladder? Not at All     How often have you had to urinate less than every two hours? Less than 1 in 5 times     How often have you found you stopped and started again several times when you urinated? Not at All     How often have you found it difficult to postpone urination? Not at All     How often have you had a weak urinary stream? Not at All     How often have you had to strain to start urination? Not at All     How many times did you typically get up at night to urinate? 2 Times     Total IPSS Score 3           Quality of Life due to urinary symptoms   If you were to spend the rest of your life with your urinary condition just the way it is now how would you feel about that? Mostly Satisfied              Score:  1-7 Mild 8-19 Moderate 20-35 Severe    PMH: Past Medical History:  Diagnosis Date   Anemia    Chickenpox    Chronic kidney disease    Diabetes mellitus without complication (HCC)    GERD (gastroesophageal reflux disease)    Heart murmur    followed by PCP   Hypertension     Prostate enlargement    Torn meniscus    right    Surgical History: Past Surgical History:  Procedure Laterality Date   CATARACT EXTRACTION W/PHACO Left 09/08/2016   Procedure: CATARACT EXTRACTION PHACO AND INTRAOCULAR LENS PLACEMENT ([place]);  Surgeon: Leandrew Koyanagi, MD;  Location: [facility];  Service: Ophthalmology;  Laterality: Left;  DIABETIC left   CATARACT EXTRACTION W/PHACO Right 09/29/2016   Procedure: CATARACT EXTRACTION PHACO AND INTRAOCULAR LENS PLACEMENT ([place])  right eye;  Surgeon: Leandrew Koyanagi, MD;  Location: [facility];  Service: Ophthalmology;  Laterality: Right;  Diabetic - oral meds Right Eye IVA Topical   COLONOSCOPY     COLONOSCOPY     COLONOSCOPY WITH PROPOFOL N/A 05/12/2020   Procedure: COLONOSCOPY WITH PROPOFOL;  Surgeon: Toledo, Benay Pike, MD;  Location: [facility] ENDOSCOPY;  Service: Gastroenterology;  Laterality: N/A;   ESOPHAGOGASTRODUODENOSCOPY     ESOPHAGOGASTRODUODENOSCOPY      Home Medications:  Allergies as of 06/17/2021   No Known Allergies      Medication  List        Accurate as of June 17, 2021  8:54 AM. If you have any questions, ask your nurse or doctor.          aspirin EC 81 MG tablet Take 81 mg by mouth daily.   B Complex-Vitamin C Caps Take 1 tablet by mouth daily.   carvedilol 3.125 MG tablet Commonly known as: COREG Take 3.125 mg by mouth 2 (two) times daily with a meal.   chlorthalidone 50 MG tablet Commonly known as: HYGROTON Take by mouth.   finasteride 5 MG tablet Commonly known as: PROSCAR Take 5 mg by mouth daily.   glipiZIDE 10 MG tablet Commonly known as: GLUCOTROL Take by mouth. Take 1 tablet (10 mg total) by mouth 2 (two) times daily before meals   Lagevrio 200 MG Caps capsule Generic drug: molnupiravir EUA Take 4 capsules by mouth 2 (two) times daily.   lisinopril 40 MG tablet Commonly known as: ZESTRIL Take 40 mg by mouth daily.   lovastatin 40 MG tablet Commonly known  as: MEVACOR Take 40 mg by mouth at bedtime.   omeprazole 20 MG capsule Commonly known as: PRILOSEC Take 40 mg by mouth in the morning and at bedtime.   OneTouch Delica Lancets [id number] Misc   OneTouch Ultra test strip Generic drug: glucose Cherry USE ONCE DAILY AS DIRECTED   WHAT MACHINE  NOTHING SINCE 2018   pioglitazone 45 MG tablet Commonly known as: ACTOS Take 45 mg by mouth daily.   sitaGLIPtin 50 MG tablet Commonly known as: JANUVIA TAKE 1 TABLET BY MOUTH EVERY DAY   spironolactone 25 MG tablet Commonly known as: ALDACTONE Take 25 mg by mouth daily. Client states he is taking 1/2 tablet daily   tamsulosin 0.4 MG Caps capsule Commonly known as: FLOMAX Take 0.4 mg by mouth daily after breakfast.   vitamin B-12 1000 MCG tablet Commonly known as: CYANOCOBALAMIN Take 1,000 mcg by mouth daily.   Vitamin D3 50 MCG (2000 UT) capsule Take 2,000 Units by mouth daily.        Allergies: No Known Allergies  Family History: Family History  Problem Relation Age of Onset   Diabetes Mother    Hypertension Mother    Colon cancer Mother    Hyperlipidemia Mother    CAD Mother    Glaucoma Mother    Heart disease Mother    Heart attack Mother    Stroke Mother    Diabetes Father    Alzheimer's disease Father    Hypertension Father    Hyperlipidemia Father    Hip fracture Father    Diabetes Sister    Glaucoma Sister    Cancer Maternal Grandmother     Social History:  reports that he has never smoked. He has never used smokeless tobacco. He reports that he does not drink alcohol and does not use drugs.   Physical Exam: BP (!) 182/68   Pulse 68   Ht 6' (1.829 m)   Wt 182 lb (82.6 kg)   BMI 24.68 kg/m   Constitutional:  Alert and oriented, No acute distress. HEENT: [place] AT, moist mucus membranes.  Trachea midline, no masses. Cardiovascular: No clubbing, cyanosis, or edema. Respiratory: Normal respiratory effort, no increased work of breathing. Rectal: Normal sphincter  tone,  50+  CC prostate, smooth no nodules Skin: No rashes, bruises or suspicious lesions. Neurologic: Grossly intact, no focal deficits, moving all 4 extremities. Psychiatric: Normal mood and affect.  Laboratory Data:  Lab Results  Component Value Date   CREATININE 2.04 (H) 04/01/2021     Lab Results  Component Value Date   HGBA1C 7.6 (H) 12/16/2017    Urinalysis Urine is negative today   Pertinent Imaging: Results for orders placed or performed in visit on 06/17/21  BLADDER SCAN AMB NON-IMAGING  Result Value Ref Range   Scan Result 0 ml      Assessment & Plan:    BPH with LUTS  - Records release from Dr. [name]  - Continue tamsulosin and finasteride; refill sen today - He was counseled on surgical intervention for his BPH with LUTS. We discussed the risk and benefits of UroLift. He is unsure whether he would like to proceed with this he will let us know.  He was provided with literature.  If he elected to pursue this, would recommend cystoscopy/TRUS. -PSA; pending   Follow-up in 1 year for, IPSS, PSA, PVR, and DRE  I,Kailey Littlejohn,acting as a scribe for Hollice Espy, MD.,have documented all relevant documentation on the behalf of Hollice Espy, MD,as directed by  Hollice Espy, MD while in the presence of Hollice Espy, MD.  I have reviewed the above documentation for accuracy and completeness, and I agree with the above.   Hollice Espy, MD  [facility] [address] [place], [place] [address] [phone]

## 2021-06-17 ENCOUNTER — Ambulatory Visit: Payer: HMO | Admitting: Urology

## 2021-06-17 ENCOUNTER — Encounter: Payer: Self-pay | Admitting: Urology

## 2021-06-17 ENCOUNTER — Other Ambulatory Visit: Payer: Self-pay

## 2021-06-17 VITALS — BP 182/68 | HR 68 | Ht 72.0 in | Wt 182.0 lb

## 2021-06-17 DIAGNOSIS — N183 Chronic kidney disease, stage 3 unspecified: Secondary | ICD-10-CM | POA: Diagnosis not present

## 2021-06-17 DIAGNOSIS — E785 Hyperlipidemia, unspecified: Secondary | ICD-10-CM | POA: Diagnosis not present

## 2021-06-17 DIAGNOSIS — N400 Enlarged prostate without lower urinary tract symptoms: Secondary | ICD-10-CM

## 2021-06-17 DIAGNOSIS — E1169 Type 2 diabetes mellitus with other specified complication: Secondary | ICD-10-CM | POA: Diagnosis not present

## 2021-06-17 DIAGNOSIS — E1122 Type 2 diabetes mellitus with diabetic chronic kidney disease: Secondary | ICD-10-CM | POA: Diagnosis not present

## 2021-06-17 DIAGNOSIS — I152 Hypertension secondary to endocrine disorders: Secondary | ICD-10-CM | POA: Diagnosis not present

## 2021-06-17 DIAGNOSIS — E1159 Type 2 diabetes mellitus with other circulatory complications: Secondary | ICD-10-CM | POA: Diagnosis not present

## 2021-06-17 LAB — BLADDER SCAN AMB NON-IMAGING: Scan Result: 0

## 2021-06-17 MED ORDER — TAMSULOSIN HCL 0.4 MG PO CAPS
0.4000 mg | ORAL_CAPSULE | Freq: Every day | ORAL | 3 refills | Status: DC
Start: 2021-06-17 — End: 2022-06-29

## 2021-06-17 MED ORDER — FINASTERIDE 5 MG PO TABS: 5.0000 mg | ORAL_TABLET | Freq: Every day | ORAL | 3 refills | Status: DC

## 2021-06-18 DIAGNOSIS — N1832 Chronic kidney disease, stage 3b: Secondary | ICD-10-CM | POA: Diagnosis not present

## 2021-06-18 DIAGNOSIS — I10 Essential (primary) hypertension: Secondary | ICD-10-CM | POA: Diagnosis not present

## 2021-06-18 DIAGNOSIS — N184 Chronic kidney disease, stage 4 (severe): Secondary | ICD-10-CM | POA: Diagnosis not present

## 2021-06-18 DIAGNOSIS — D631 Anemia in chronic kidney disease: Secondary | ICD-10-CM | POA: Diagnosis not present

## 2021-06-18 DIAGNOSIS — Z6826 Body mass index (BMI) 26.0-26.9, adult: Secondary | ICD-10-CM | POA: Diagnosis not present

## 2021-06-18 DIAGNOSIS — E139 Other specified diabetes mellitus without complications: Secondary | ICD-10-CM | POA: Diagnosis not present

## 2021-06-18 LAB — MICROSCOPIC EXAMINATION: Bacteria, UA: NONE SEEN

## 2021-06-18 LAB — URINALYSIS, COMPLETE
Bilirubin, UA: NEGATIVE
Glucose, UA: NEGATIVE
Ketones, UA: NEGATIVE
Leukocytes,UA: NEGATIVE
Nitrite, UA: NEGATIVE
Protein,UA: NEGATIVE
RBC, UA: NEGATIVE
Specific Gravity, UA: 1.02 (ref 1.005–1.030)
Urobilinogen, Ur: 0.2 mg/dL (ref 0.2–1.0)
pH, UA: 5.5 (ref 5.0–7.5)

## 2021-06-18 LAB — PSA: Prostate Specific Ag, Serum: 3.5 ng/mL (ref 0.0–4.0)

## 2021-06-19 ENCOUNTER — Telehealth: Payer: Self-pay | Admitting: *Deleted

## 2021-06-19 NOTE — Telephone Encounter (Addendum)
Patient advised, voiced understanding.  ----- Message from Hollice Espy, MD sent at 06/19/2021  7:35 AM EDT ----- PSA is within the normal range but because you are on finasteride, it is actually higher than I would expect.  We will wait to get the records from Dr. Letta Kocher office to see where your PSA levels have been in the past.  Hollice Espy, MD

## 2021-06-22 ENCOUNTER — Telehealth: Payer: Self-pay | Admitting: *Deleted

## 2021-06-22 DIAGNOSIS — N400 Enlarged prostate without lower urinary tract symptoms: Secondary | ICD-10-CM

## 2021-06-22 NOTE — Telephone Encounter (Addendum)
Left patient a VM asked to return call-scheduled lab   ----- Message from Hollice Espy, MD sent at 06/22/2021  2:58 PM EDT ----- It looks like the PSA is going up a little bit.  Lets have him repeat it in 6 months to see if it stabilizes.  Please set him up for a lab only visit.  Hollice Espy, MD

## 2021-06-23 NOTE — Telephone Encounter (Signed)
Notified patient as instructed, patient pleased. Discussed follow-up appointments, patient agrees  

## 2021-06-24 ENCOUNTER — Ambulatory Visit: Payer: HMO | Admitting: Oncology

## 2021-06-24 ENCOUNTER — Other Ambulatory Visit: Payer: HMO

## 2021-06-24 ENCOUNTER — Ambulatory Visit: Payer: HMO

## 2021-06-30 ENCOUNTER — Encounter: Payer: Self-pay | Admitting: Oncology

## 2021-06-30 ENCOUNTER — Inpatient Hospital Stay: Payer: HMO

## 2021-06-30 ENCOUNTER — Inpatient Hospital Stay: Payer: HMO | Attending: Oncology

## 2021-06-30 ENCOUNTER — Inpatient Hospital Stay: Payer: HMO | Admitting: Oncology

## 2021-06-30 ENCOUNTER — Other Ambulatory Visit: Payer: Self-pay

## 2021-06-30 VITALS — BP 175/71 | HR 61 | Temp 97.6°F | Wt 190.2 lb

## 2021-06-30 DIAGNOSIS — E538 Deficiency of other specified B group vitamins: Secondary | ICD-10-CM | POA: Insufficient documentation

## 2021-06-30 DIAGNOSIS — Z808 Family history of malignant neoplasm of other organs or systems: Secondary | ICD-10-CM | POA: Diagnosis not present

## 2021-06-30 DIAGNOSIS — I129 Hypertensive chronic kidney disease with stage 1 through stage 4 chronic kidney disease, or unspecified chronic kidney disease: Secondary | ICD-10-CM | POA: Diagnosis not present

## 2021-06-30 DIAGNOSIS — Z809 Family history of malignant neoplasm, unspecified: Secondary | ICD-10-CM | POA: Insufficient documentation

## 2021-06-30 DIAGNOSIS — N183 Chronic kidney disease, stage 3 unspecified: Secondary | ICD-10-CM

## 2021-06-30 DIAGNOSIS — D631 Anemia in chronic kidney disease: Secondary | ICD-10-CM | POA: Insufficient documentation

## 2021-06-30 DIAGNOSIS — D509 Iron deficiency anemia, unspecified: Secondary | ICD-10-CM | POA: Diagnosis not present

## 2021-06-30 DIAGNOSIS — N184 Chronic kidney disease, stage 4 (severe): Secondary | ICD-10-CM | POA: Insufficient documentation

## 2021-06-30 DIAGNOSIS — E1122 Type 2 diabetes mellitus with diabetic chronic kidney disease: Secondary | ICD-10-CM | POA: Diagnosis not present

## 2021-06-30 LAB — CBC WITH DIFFERENTIAL/PLATELET
Abs Immature Granulocytes: 0.01 10*3/uL (ref 0.00–0.07)
Basophils Absolute: 0 10*3/uL (ref 0.0–0.1)
Basophils Relative: 1 %
Eosinophils Absolute: 0.3 10*3/uL (ref 0.0–0.5)
Eosinophils Relative: 5 %
HCT: 29.6 % — ABNORMAL LOW (ref 39.0–52.0)
Hemoglobin: 9.5 g/dL — ABNORMAL LOW (ref 13.0–17.0)
Immature Granulocytes: 0 %
Lymphocytes Relative: 25 %
Lymphs Abs: 1.2 10*3/uL (ref 0.7–4.0)
MCH: 32.4 pg (ref 26.0–34.0)
MCHC: 32.1 g/dL (ref 30.0–36.0)
MCV: 101 fL — ABNORMAL HIGH (ref 80.0–100.0)
Monocytes Absolute: 0.7 10*3/uL (ref 0.1–1.0)
Monocytes Relative: 14 %
Neutro Abs: 2.8 10*3/uL (ref 1.7–7.7)
Neutrophils Relative %: 55 %
Platelets: 193 10*3/uL (ref 150–400)
RBC: 2.93 MIL/uL — ABNORMAL LOW (ref 4.22–5.81)
RDW: 14 % (ref 11.5–15.5)
WBC: 5 10*3/uL (ref 4.0–10.5)
nRBC: 0 % (ref 0.0–0.2)

## 2021-06-30 LAB — FERRITIN: Ferritin: 160 ng/mL (ref 24–336)

## 2021-06-30 LAB — IRON AND TIBC
Iron: 70 ug/dL (ref 45–182)
Saturation Ratios: 20 % (ref 17.9–39.5)
TIBC: 351 ug/dL (ref 250–450)
UIBC: 281 ug/dL

## 2021-06-30 MED ORDER — FOLIC ACID 400 MCG PO TABS: 400.0000 ug | ORAL_TABLET | Freq: Every day | ORAL | 3 refills | Status: DC

## 2021-06-30 NOTE — Progress Notes (Signed)
Hematology/Oncology Consult note [facility] [phone] [phone]      Clinic Day:  06/30/2021  Referring physician: [name], MD  Chief Complaint: Joshua Cherry is a 70 y.o. male presents for anemia due to  stage III chronic kidney disease.  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    Work-up in 10/2014 revealed a hematocrit of 31.8, hemoglobin 10.3, MCV 95, with a normal WBC and platelet count.  B12 and folate were normal.  SPEP was normal on 07/09/2014.  Iron studies were normal with a saturation of 18% and a TIBC of 297. Ferritin was 72.  Erythropoietin was 10.6.  Antinuclear antibody direct was positive with an anti-DNA double-stranded 24 (0-9).  Sedimentation rate was 3.  Haptoglobin was 181. Coombs was negative.    Work-up on 08/10/2017 revealed a ferritin of 116.  Iron saturation was 14% with a TIBC of 372.  BUN was 59 with a Cr 1.98 (CrCl 33-38 ml/min).  Epo level was 8.8.  SPEP revealed no monoclonal protein.  Retic was 1%.  Urinalysis revealed no hematuria.  Ferritin was 81 on 11/15/2019.  Labs on 11/19/2019 included an iron saturation of 17% with a TIBC of 347. TSH and B12 were normal. Retic was 1.3%.  He receives Retacrit every 2 weeks (12/13/2019 - 12/27/2019) then monthly   # EGD and colonoscopy noted only reflux in 2015.  Guaiac cards were negative.  Colonoscopy on 05/12/2020 revealed non-bleeding internal hemorrhoids.  He has never had a capsule study.     # 2015  B12 deficiency .    INTERVAL HISTORY Joshua Cherry is a 69 y.o. male who has above history reviewed by me today presents for follow up visit for management of anemia of CKD During the interval patient has received IV Venofer treatments x2.  He tolerates well with no side effects.  He has not felt any  difference in terms of his energy level.  Review of Systems  Constitutional:  Negative for appetite change, chills, fatigue, fever and unexpected weight change.  HENT:   Negative for hearing loss and voice change.   Eyes:  Negative for eye problems and icterus.  Respiratory:  Negative for chest tightness, cough and shortness of breath.   Cardiovascular:  Negative for chest pain and leg swelling.  Gastrointestinal:  Negative for abdominal distention and abdominal pain.  Endocrine: Negative for hot flashes.  Genitourinary:  Negative for difficulty urinating, dysuria and frequency.   Musculoskeletal:  Negative for arthralgias.  Skin:  Negative for itching and rash.  Neurological:  Negative for light-headedness and numbness.  Hematological:  Negative for adenopathy. Does not bruise/bleed easily.  Psychiatric/Behavioral:  Negative for confusion.       Past Medical History:  Diagnosis Date   Anemia    Chickenpox    Chronic kidney disease    Diabetes mellitus without complication (HCC)    GERD (gastroesophageal reflux disease)    Heart murmur    followed by PCP   Hypertension    Prostate enlargement    Torn meniscus    right    Past Surgical History:  Procedure Laterality Date   CATARACT EXTRACTION W/PHACO Left 09/08/2016   Procedure: CATARACT EXTRACTION PHACO AND INTRAOCULAR LENS PLACEMENT ([place]);  Surgeon: Leandrew Koyanagi, MD;  Location: [facility];  Service: Ophthalmology;  Laterality: Left;  DIABETIC  left   CATARACT EXTRACTION W/PHACO Right 09/29/2016   Procedure: CATARACT EXTRACTION PHACO AND INTRAOCULAR LENS PLACEMENT ([place])  right eye;  Surgeon: Leandrew Koyanagi, MD;  Location: [facility];  Service: Ophthalmology;  Laterality: Right;  Diabetic - oral meds Right Eye IVA Topical   COLONOSCOPY     COLONOSCOPY     COLONOSCOPY WITH PROPOFOL N/A 05/12/2020   Procedure: COLONOSCOPY WITH PROPOFOL;  Surgeon: Toledo, Benay Pike, MD;  Location: [facility];   Service: Gastroenterology;  Laterality: N/A;   ESOPHAGOGASTRODUODENOSCOPY     ESOPHAGOGASTRODUODENOSCOPY      Family History  Problem Relation Age of Onset   Diabetes Mother    Hypertension Mother    Colon cancer Mother    Hyperlipidemia Mother    CAD Mother    Glaucoma Mother    Heart disease Mother    Heart attack Mother    Stroke Mother    Diabetes Father    Alzheimer's disease Father    Hypertension Father    Hyperlipidemia Father    Hip fracture Father    Diabetes Sister    Glaucoma Sister    Cancer Maternal Grandmother     Social History:  reports that he has never smoked. He has never used smokeless tobacco. He reports that he does not drink alcohol and does not use drugs. 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 The patient is alone today.   Allergies: No Known Allergies  Current Medications: Current Outpatient Medications  Medication Sig Dispense Refill   aspirin EC 81 MG tablet Take 81 mg by mouth daily.      B Complex-C (B COMPLEX-VITAMIN C) CAPS Take 1 tablet by mouth daily.      carvedilol (COREG) 3.125 MG tablet Take 3.125 mg by mouth 2 (two) times daily with a meal.      chlorthalidone (HYGROTON) 50 MG tablet Take by mouth.     Cholecalciferol (VITAMIN D3) 2000 UNITS capsule Take 2,000 Units by mouth daily.      finasteride (PROSCAR) 5 MG tablet Take 1 tablet (5 mg total) by mouth daily. 90 tablet 3   folic acid (V-R FOLIC ACID) 338 MCG tablet Take 1 tablet (400 mcg total) by mouth daily. 30 tablet 3   glipiZIDE (GLUCOTROL) 10 MG tablet Take by mouth. Take 1 tablet (10 mg total) by mouth 2 (two) times daily before meals     LAGEVRIO 200 MG CAPS capsule Take 4 capsules by mouth 2 (two) times daily.     lisinopril (PRINIVIL,ZESTRIL) 40 MG tablet Take 40 mg by mouth daily.     lovastatin (MEVACOR) 40 MG tablet Take 40 mg by mouth at bedtime.     omeprazole (PRILOSEC) 20 MG capsule Take 40 mg by mouth in the morning  and at bedtime.      ONETOUCH DELICA LANCETS 25K MISC      ONETOUCH ULTRA test strip USE ONCE DAILY AS DIRECTED   WHAT MACHINE  NOTHING SINCE 2018     pioglitazone (ACTOS) 45 MG tablet Take 45 mg by mouth daily.      sitaGLIPtin (JANUVIA) 50 MG tablet TAKE 1 TABLET BY MOUTH EVERY DAY     spironolactone (ALDACTONE) 25 MG tablet Take 25 mg by mouth daily. Client states he is taking 1/2 tablet daily     tamsulosin (FLOMAX) 0.4 MG CAPS capsule Take 1 capsule (0.4 mg total) by mouth daily after breakfast. 90 capsule 3   vitamin  B-12 (CYANOCOBALAMIN) 1000 MCG tablet Take 1,000 mcg by mouth daily.     No current facility-administered medications for this visit.     Performance status (ECOG): 0   Vitals:  Blood pressure (!) 175/71, pulse 61, temperature 97.6 F (36.4 C), temperature source Tympanic, weight 190 lb 3.2 oz (86.3 kg).  Physical Exam Constitutional:      General: He is not in acute distress.    Appearance: He is not diaphoretic.     Comments: Ambulates independently  HENT:     Head: Normocephalic and atraumatic.     Nose: Nose normal.     Mouth/Throat:     Pharynx: No oropharyngeal exudate.  Eyes:     General: No scleral icterus.    Pupils: Pupils are equal, round, and reactive to light.  Cardiovascular:     Rate and Rhythm: Normal rate and regular rhythm.     Heart sounds: Murmur heard.  Pulmonary:     Effort: Pulmonary effort is normal. No respiratory distress.     Breath sounds: No rales.  Chest:     Chest wall: No tenderness.  Abdominal:     General: There is no distension.     Palpations: Abdomen is soft.     Tenderness: There is no abdominal tenderness.  Musculoskeletal:        General: Normal range of motion.     Cervical back: Normal range of motion and neck supple.  Skin:    General: Skin is warm and dry.     Findings: No erythema.  Neurological:     Mental Status: He is alert and oriented to person, place, and time.     Cranial Nerves: No cranial  nerve deficit.     Motor: No abnormal muscle tone.     Coordination: Coordination normal.  Psychiatric:        Mood and Affect: Affect normal.      Assessment and Plan:  1. Iron deficiency anemia, unspecified iron deficiency anemia type   2. Anemia of chronic kidney failure, stage 3 (moderate) ([place])   3. B12 deficiency    #  Anemia of chronic renal disease Labs reviewed and discussed with patient. Hemoglobin is 9.5. Iron panel showed ferritin 160.  Iron saturation 20 Recommend patient to proceed with additional IV Venofer treatments, weekly x 3 Patient received Procrit on 06/02/2021.  10,000 units.   H&H around 11/23 +/- Retacrit.   # History of B12 deficiency Continue oral B12 supplementation. C   Follow up  Venofer weekly x3 Lab H&H 1123 +/- Retacrit Lab 8 weeks CBC iron TIBC ferritin MD +/- Retacrit or Venofer.   I discussed the assessment and treatment plan with the patient.  The patient was provided an opportunity to ask questions and all were answered.  The patient agreed with the plan and demonstrated an understanding of the instructions.  The patient was advised to call back or seek an in person evaluation if the symptoms worsen or if the condition fails to improve as anticipated.   Earlie Server, MD, PhD Hematology Oncology [place] at [facility] 06/30/2021

## 2021-06-30 NOTE — Addendum Note (Signed)
Addended by: Earlie Server on: 06/30/2021 08:24 PM   Modules accepted: Orders

## 2021-07-01 ENCOUNTER — Inpatient Hospital Stay: Payer: HMO

## 2021-07-01 ENCOUNTER — Telehealth: Payer: Self-pay

## 2021-07-01 VITALS — BP 131/50 | HR 64 | Temp 97.0°F | Resp 18

## 2021-07-01 DIAGNOSIS — D509 Iron deficiency anemia, unspecified: Secondary | ICD-10-CM

## 2021-07-01 DIAGNOSIS — I129 Hypertensive chronic kidney disease with stage 1 through stage 4 chronic kidney disease, or unspecified chronic kidney disease: Secondary | ICD-10-CM | POA: Diagnosis not present

## 2021-07-01 MED ORDER — SODIUM CHLORIDE 0.9 % IV SOLN: 200.0000 mg | Freq: Once | INTRAVENOUS | Status: DC

## 2021-07-01 MED ORDER — IRON SUCROSE 20 MG/ML IV SOLN
200.0000 mg | Freq: Once | INTRAVENOUS | Status: AC
  Administered 2021-07-01: 200 mg via INTRAVENOUS
  Filled 2021-07-01: qty 10

## 2021-07-01 MED ORDER — SODIUM CHLORIDE 0.9 % IV SOLN
Freq: Once | INTRAVENOUS | Status: AC
  Filled 2021-07-01: qty 250

## 2021-07-01 NOTE — Telephone Encounter (Signed)
-----   Message from Earlie Server, MD sent at 06/30/2021  8:22 PM EDT ----- I need to adjust his plan.  Venofer weekly x 3.  Cancel Venofer on 11/23, instead do H&H on 11/23 with +/- Retacrit.

## 2021-07-01 NOTE — Telephone Encounter (Signed)
Please adjust appts as recommended by MD.

## 2021-07-01 NOTE — Patient Instructions (Signed)
[facility]   Discharge Instructions: Thank you for choosing [facility] to provide your oncology and hematology care.  If you have a lab appointment with the Cancer Center, please go directly to the Cancer Center and check in at the registration area.  We strive to give you quality time with your provider. You may need to reschedule your appointment if you arrive late (15 or more minutes).  Arriving late affects you and other patients whose appointments are after yours.  Also, if you miss three or more appointments without notifying the office, you may be dismissed from the clinic at the provider's discretion.      For prescription refill requests, have your pharmacy contact our office and allow 72 hours for refills to be completed.    Today you received the following: Venofer.      BELOW ARE SYMPTOMS THAT SHOULD BE REPORTED IMMEDIATELY: *FEVER GREATER THAN 100.4 F (38 C) OR HIGHER *CHILLS OR SWEATING *NAUSEA AND VOMITING THAT IS NOT CONTROLLED WITH YOUR NAUSEA MEDICATION *UNUSUAL SHORTNESS OF BREATH *UNUSUAL BRUISING OR BLEEDING *URINARY PROBLEMS (pain or burning when urinating, or frequent urination) *BOWEL PROBLEMS (unusual diarrhea, constipation, pain near the anus) TENDERNESS IN MOUTH AND THROAT WITH OR WITHOUT PRESENCE OF ULCERS (sore throat, sores in mouth, or a toothache) UNUSUAL RASH, SWELLING OR PAIN  UNUSUAL VAGINAL DISCHARGE OR ITCHING   Items with * indicate a potential emergency and should be followed up as soon as possible or go to the Emergency Department if any problems should occur.  Should you have questions after your visit or need to cancel or reschedule your appointment, please contact [facility]  [phone] and follow the prompts.  Office hours are 8:00 a.m. to 4:30 p.m. Monday - Friday. Please note that voicemails left after 4:00 p.m. may not be returned until the following  business day.  We are closed weekends and major holidays. You have access to a nurse at all times for urgent questions. Please call the main number to the clinic [phone] and follow the prompts.  For any non-urgent questions, you may also contact your provider using MyChart. We now offer e-Visits for anyone 18 and older to request care online for non-urgent symptoms. For details visit [email or web address].   Also download the MyChart app! Go to the app store, search "MyChart", open the app, select , and log in with your MyChart username and password.  Due to Covid, a mask is required upon entering the hospital/clinic. If you do not have a mask, one will be given to you upon arrival. For doctor visits, patients may have 1 support person aged 18 or older with them. For treatment visits, patients cannot have anyone with them due to current Covid guidelines and our immunocompromised population.  

## 2021-07-01 NOTE — Telephone Encounter (Signed)
Appointments adjusted, patient notified.

## 2021-07-08 ENCOUNTER — Other Ambulatory Visit: Payer: Self-pay

## 2021-07-08 ENCOUNTER — Inpatient Hospital Stay: Payer: HMO

## 2021-07-08 VITALS — BP 123/59 | HR 66 | Temp 97.9°F | Resp 18

## 2021-07-08 DIAGNOSIS — D509 Iron deficiency anemia, unspecified: Secondary | ICD-10-CM

## 2021-07-08 DIAGNOSIS — I129 Hypertensive chronic kidney disease with stage 1 through stage 4 chronic kidney disease, or unspecified chronic kidney disease: Secondary | ICD-10-CM | POA: Diagnosis not present

## 2021-07-08 MED ORDER — IRON SUCROSE 20 MG/ML IV SOLN
200.0000 mg | Freq: Once | INTRAVENOUS | Status: AC
  Administered 2021-07-08: 200 mg via INTRAVENOUS

## 2021-07-08 MED ORDER — SODIUM CHLORIDE 0.9 % IV SOLN
Freq: Once | INTRAVENOUS | Status: AC
  Filled 2021-07-08: qty 250

## 2021-07-08 MED ORDER — SODIUM CHLORIDE 0.9 % IV SOLN: 200.0000 mg | Freq: Once | INTRAVENOUS | Status: DC

## 2021-07-15 ENCOUNTER — Inpatient Hospital Stay: Payer: HMO

## 2021-07-15 ENCOUNTER — Other Ambulatory Visit: Payer: Self-pay

## 2021-07-15 VITALS — BP 142/52 | HR 64 | Temp 96.0°F | Resp 18

## 2021-07-15 DIAGNOSIS — I129 Hypertensive chronic kidney disease with stage 1 through stage 4 chronic kidney disease, or unspecified chronic kidney disease: Secondary | ICD-10-CM | POA: Diagnosis not present

## 2021-07-15 DIAGNOSIS — D509 Iron deficiency anemia, unspecified: Secondary | ICD-10-CM

## 2021-07-15 MED ORDER — SODIUM CHLORIDE 0.9 % IV SOLN: 200.0000 mg | Freq: Once | INTRAVENOUS | Status: DC

## 2021-07-15 MED ORDER — IRON SUCROSE 20 MG/ML IV SOLN
200.0000 mg | Freq: Once | INTRAVENOUS | Status: AC
  Administered 2021-07-15: 200 mg via INTRAVENOUS
  Filled 2021-07-15: qty 10

## 2021-07-15 MED ORDER — SODIUM CHLORIDE 0.9 % IV SOLN
Freq: Once | INTRAVENOUS | Status: AC
  Filled 2021-07-15: qty 250

## 2021-07-15 NOTE — Patient Instructions (Signed)
[facility]  Discharge Instructions: Thank you for choosing [facility] to provide your oncology and hematology care.  If you have a lab appointment with the Cancer Center, please go directly to the Cancer Center and check in at the registration area.  Wear comfortable clothing and clothing appropriate for easy access to any Portacath or PICC line.   We strive to give you quality time with your provider. You may need to reschedule your appointment if you arrive late (15 or more minutes).  Arriving late affects you and other patients whose appointments are after yours.  Also, if you miss three or more appointments without notifying the office, you may be dismissed from the clinic at the provider's discretion.      For prescription refill requests, have your pharmacy contact our office and allow 72 hours for refills to be completed.    Today you received the following chemotherapy and/or immunotherapy agents VENOFER      To help prevent nausea and vomiting after your treatment, we encourage you to take your nausea medication as directed.  BELOW ARE SYMPTOMS THAT SHOULD BE REPORTED IMMEDIATELY: *FEVER GREATER THAN 100.4 F (38 C) OR HIGHER *CHILLS OR SWEATING *NAUSEA AND VOMITING THAT IS NOT CONTROLLED WITH YOUR NAUSEA MEDICATION *UNUSUAL SHORTNESS OF BREATH *UNUSUAL BRUISING OR BLEEDING *URINARY PROBLEMS (pain or burning when urinating, or frequent urination) *BOWEL PROBLEMS (unusual diarrhea, constipation, pain near the anus) TENDERNESS IN MOUTH AND THROAT WITH OR WITHOUT PRESENCE OF ULCERS (sore throat, sores in mouth, or a toothache) UNUSUAL RASH, SWELLING OR PAIN  UNUSUAL VAGINAL DISCHARGE OR ITCHING   Items with * indicate a potential emergency and should be followed up as soon as possible or go to the Emergency Department if any problems should occur.  Please show the CHEMOTHERAPY ALERT CARD or IMMUNOTHERAPY ALERT CARD at check-in to  the Emergency Department and triage nurse.  Should you have questions after your visit or need to cancel or reschedule your appointment, please contact [facility]  [phone] and follow the prompts.  Office hours are 8:00 a.m. to 4:30 p.m. Monday - Friday. Please note that voicemails left after 4:00 p.m. may not be returned until the following business day.  We are closed weekends and major holidays. You have access to a nurse at all times for urgent questions. Please call the main number to the clinic [phone] and follow the prompts.  For any non-urgent questions, you may also contact your provider using MyChart. We now offer e-Visits for anyone 18 and older to request care online for non-urgent symptoms. For details visit [email or web address].   Also download the MyChart app! Go to the app store, search "MyChart", open the app, select [facility], and log in with your MyChart username and password.  Due to Covid, a mask is required upon entering the hospital/clinic. If you do not have a mask, one will be given to you upon arrival. For doctor visits, patients may have 1 support person aged 18 or older with them. For treatment visits, patients cannot have anyone with them due to current Covid guidelines and our immunocompromised population.   Iron Sucrose Injection What is this medication? IRON SUCROSE (EYE ern SOO krose) treats low levels of iron (iron deficiency anemia) in people with kidney disease. Iron is a mineral that plays an important role in making red blood cells, which carry oxygen from your lungs to the rest of your body. This medicine may   be used for other purposes; ask your health care provider or pharmacist if you have questions. COMMON BRAND NAME(S): Venofer What should I tell my care team before I take this medication? They need to know if you have any of these conditions: Anemia not caused by low iron levels Heart disease High levels  of iron in the blood Kidney disease Liver disease An unusual or allergic reaction to iron, other medications, foods, dyes, or preservatives Pregnant or trying to get pregnant Breast-feeding How should I use this medication? This medication is for infusion into a vein. It is given in a hospital or clinic setting. Talk to your care team about the use of this medication in children. While this medication may be prescribed for children as young as 2 years for selected conditions, precautions do apply. Overdosage: If you think you have taken too much of this medicine contact a poison control center or emergency room at once. NOTE: This medicine is only for you. Do not share this medicine with others. What if I miss a dose? It is important not to miss your dose. Call your care team if you are unable to keep an appointment. What may interact with this medication? Do not take this medication with any of the following: Deferoxamine Dimercaprol Other iron products This medication may also interact with the following: Chloramphenicol Deferasirox This list may not describe all possible interactions. Give your health care provider a list of all the medicines, herbs, non-prescription drugs, or dietary supplements you use. Also tell them if you smoke, drink alcohol, or use illegal drugs. Some items may interact with your medicine. What should I watch for while using this medication? Visit your care team regularly. Tell your care team if your symptoms do not start to get better or if they get worse. You may need blood work done while you are taking this medication. You may need to follow a special diet. Talk to your care team. Foods that contain iron include: whole grains/cereals, dried fruits, beans, or peas, leafy green vegetables, and organ meats (liver, kidney). What side effects may I notice from receiving this medication? Side effects that you should report to your care team as soon as  possible: Allergic reactions--skin rash, itching, hives, swelling of the face, lips, tongue, or throat Low blood pressure--dizziness, feeling faint or lightheaded, blurry vision Shortness of breath Side effects that usually do not require medical attention (report to your care team if they continue or are bothersome): Flushing Headache Joint pain Muscle pain Nausea Pain, redness, or irritation at injection site This list may not describe all possible side effects. Call your doctor for medical advice about side effects. You may report side effects to FDA at 1-800-FDA-1088. Where should I keep my medication? This medication is given in a hospital or clinic and will not be stored at home. NOTE: This sheet is a summary. It may not cover all possible information. If you have questions about this medicine, talk to your doctor, pharmacist, or health care provider.  2022 Elsevier/Gold Standard (2021-01-09 00:00:00)  

## 2021-07-22 ENCOUNTER — Other Ambulatory Visit: Payer: Self-pay

## 2021-07-22 ENCOUNTER — Inpatient Hospital Stay: Payer: HMO

## 2021-07-22 ENCOUNTER — Ambulatory Visit: Payer: HMO

## 2021-07-22 VITALS — BP 143/57 | HR 71

## 2021-07-22 DIAGNOSIS — D509 Iron deficiency anemia, unspecified: Secondary | ICD-10-CM

## 2021-07-22 DIAGNOSIS — D631 Anemia in chronic kidney disease: Secondary | ICD-10-CM

## 2021-07-22 DIAGNOSIS — N183 Chronic kidney disease, stage 3 unspecified: Secondary | ICD-10-CM

## 2021-07-22 DIAGNOSIS — E538 Deficiency of other specified B group vitamins: Secondary | ICD-10-CM

## 2021-07-22 DIAGNOSIS — I129 Hypertensive chronic kidney disease with stage 1 through stage 4 chronic kidney disease, or unspecified chronic kidney disease: Secondary | ICD-10-CM | POA: Diagnosis not present

## 2021-07-22 LAB — CBC WITH DIFFERENTIAL/PLATELET
Abs Immature Granulocytes: 0.02 10*3/uL (ref 0.00–0.07)
Basophils Absolute: 0 10*3/uL (ref 0.0–0.1)
Basophils Relative: 1 %
Eosinophils Absolute: 0.3 10*3/uL (ref 0.0–0.5)
Eosinophils Relative: 6 %
HCT: 28.2 % — ABNORMAL LOW (ref 39.0–52.0)
Hemoglobin: 8.9 g/dL — ABNORMAL LOW (ref 13.0–17.0)
Immature Granulocytes: 0 %
Lymphocytes Relative: 25 %
Lymphs Abs: 1.2 10*3/uL (ref 0.7–4.0)
MCH: 31.9 pg (ref 26.0–34.0)
MCHC: 31.6 g/dL (ref 30.0–36.0)
MCV: 101.1 fL — ABNORMAL HIGH (ref 80.0–100.0)
Monocytes Absolute: 0.8 10*3/uL (ref 0.1–1.0)
Monocytes Relative: 16 %
Neutro Abs: 2.5 10*3/uL (ref 1.7–7.7)
Neutrophils Relative %: 52 %
Platelets: 177 10*3/uL (ref 150–400)
RBC: 2.79 MIL/uL — ABNORMAL LOW (ref 4.22–5.81)
RDW: 13.9 % (ref 11.5–15.5)
WBC: 4.7 10*3/uL (ref 4.0–10.5)
nRBC: 0 % (ref 0.0–0.2)

## 2021-07-22 LAB — IRON AND TIBC
Iron: 89 ug/dL (ref 45–182)
Saturation Ratios: 28 % (ref 17.9–39.5)
TIBC: 315 ug/dL (ref 250–450)
UIBC: 226 ug/dL

## 2021-07-22 LAB — FERRITIN: Ferritin: 308 ng/mL (ref 24–336)

## 2021-07-22 MED ORDER — EPOETIN ALFA-EPBX 10000 UNIT/ML IJ SOLN
20000.0000 [IU] | Freq: Once | INTRAMUSCULAR | Status: AC
  Administered 2021-07-22: 20000 [IU] via SUBCUTANEOUS
  Filled 2021-07-22: qty 2

## 2021-08-17 ENCOUNTER — Telehealth: Payer: Self-pay | Admitting: Oncology

## 2021-08-17 NOTE — Telephone Encounter (Signed)
Pt called to reschedule his appt for 12-27 and 12-29, call back at [phone]

## 2021-08-19 ENCOUNTER — Other Ambulatory Visit: Payer: Self-pay | Admitting: *Deleted

## 2021-08-19 DIAGNOSIS — D509 Iron deficiency anemia, unspecified: Secondary | ICD-10-CM

## 2021-08-19 DIAGNOSIS — D631 Anemia in chronic kidney disease: Secondary | ICD-10-CM

## 2021-08-25 ENCOUNTER — Other Ambulatory Visit: Payer: HMO

## 2021-08-25 ENCOUNTER — Inpatient Hospital Stay: Payer: HMO | Attending: Oncology

## 2021-08-27 ENCOUNTER — Ambulatory Visit: Payer: HMO

## 2021-08-27 ENCOUNTER — Ambulatory Visit: Payer: HMO | Admitting: Oncology

## 2021-09-09 ENCOUNTER — Encounter: Payer: Self-pay | Admitting: Urgent Care

## 2021-09-09 ENCOUNTER — Other Ambulatory Visit: Payer: Self-pay | Admitting: *Deleted

## 2021-09-09 ENCOUNTER — Ambulatory Visit: Payer: HMO

## 2021-09-09 ENCOUNTER — Ambulatory Visit: Payer: HMO | Admitting: Oncology

## 2021-09-09 DIAGNOSIS — D509 Iron deficiency anemia, unspecified: Secondary | ICD-10-CM

## 2021-09-13 ENCOUNTER — Encounter: Payer: Self-pay | Admitting: Urgent Care

## 2021-09-14 ENCOUNTER — Inpatient Hospital Stay: Payer: HMO | Attending: Oncology

## 2021-09-14 ENCOUNTER — Other Ambulatory Visit: Payer: Self-pay

## 2021-09-14 DIAGNOSIS — Z800 Family history of malignant neoplasm of digestive organs: Secondary | ICD-10-CM | POA: Diagnosis not present

## 2021-09-14 DIAGNOSIS — D631 Anemia in chronic kidney disease: Secondary | ICD-10-CM | POA: Insufficient documentation

## 2021-09-14 DIAGNOSIS — N184 Chronic kidney disease, stage 4 (severe): Secondary | ICD-10-CM | POA: Diagnosis not present

## 2021-09-14 DIAGNOSIS — D509 Iron deficiency anemia, unspecified: Secondary | ICD-10-CM | POA: Insufficient documentation

## 2021-09-14 DIAGNOSIS — E538 Deficiency of other specified B group vitamins: Secondary | ICD-10-CM | POA: Diagnosis not present

## 2021-09-14 DIAGNOSIS — I129 Hypertensive chronic kidney disease with stage 1 through stage 4 chronic kidney disease, or unspecified chronic kidney disease: Secondary | ICD-10-CM | POA: Diagnosis not present

## 2021-09-14 DIAGNOSIS — E119 Type 2 diabetes mellitus without complications: Secondary | ICD-10-CM | POA: Insufficient documentation

## 2021-09-14 LAB — IRON AND TIBC
Iron: 100 ug/dL (ref 45–182)
Saturation Ratios: 30 % (ref 17.9–39.5)
TIBC: 339 ug/dL (ref 250–450)
UIBC: 239 ug/dL

## 2021-09-14 LAB — CBC WITH DIFFERENTIAL/PLATELET
Abs Immature Granulocytes: 0.03 10*3/uL (ref 0.00–0.07)
Basophils Absolute: 0 10*3/uL (ref 0.0–0.1)
Basophils Relative: 1 %
Eosinophils Absolute: 0.5 10*3/uL (ref 0.0–0.5)
Eosinophils Relative: 10 %
HCT: 35 % — ABNORMAL LOW (ref 39.0–52.0)
Hemoglobin: 11.2 g/dL — ABNORMAL LOW (ref 13.0–17.0)
Immature Granulocytes: 1 %
Lymphocytes Relative: 22 %
Lymphs Abs: 1 10*3/uL (ref 0.7–4.0)
MCH: 31.8 pg (ref 26.0–34.0)
MCHC: 32 g/dL (ref 30.0–36.0)
MCV: 99.4 fL (ref 80.0–100.0)
Monocytes Absolute: 0.5 10*3/uL (ref 0.1–1.0)
Monocytes Relative: 11 %
Neutro Abs: 2.7 10*3/uL (ref 1.7–7.7)
Neutrophils Relative %: 55 %
Platelets: 221 10*3/uL (ref 150–400)
RBC: 3.52 MIL/uL — ABNORMAL LOW (ref 4.22–5.81)
RDW: 13.3 % (ref 11.5–15.5)
WBC: 4.7 10*3/uL (ref 4.0–10.5)
nRBC: 0 % (ref 0.0–0.2)

## 2021-09-14 LAB — FERRITIN: Ferritin: 302 ng/mL (ref 24–336)

## 2021-09-16 ENCOUNTER — Encounter: Payer: Self-pay | Admitting: Oncology

## 2021-09-16 ENCOUNTER — Inpatient Hospital Stay: Payer: HMO | Admitting: Oncology

## 2021-09-16 ENCOUNTER — Other Ambulatory Visit: Payer: Self-pay

## 2021-09-16 ENCOUNTER — Inpatient Hospital Stay: Payer: HMO

## 2021-09-16 ENCOUNTER — Other Ambulatory Visit: Payer: HMO

## 2021-09-16 VITALS — BP 138/49 | HR 67 | Temp 97.1°F | Resp 18 | Wt 184.5 lb

## 2021-09-16 DIAGNOSIS — N183 Chronic kidney disease, stage 3 unspecified: Secondary | ICD-10-CM

## 2021-09-16 DIAGNOSIS — N184 Chronic kidney disease, stage 4 (severe): Secondary | ICD-10-CM | POA: Diagnosis not present

## 2021-09-16 DIAGNOSIS — E538 Deficiency of other specified B group vitamins: Secondary | ICD-10-CM | POA: Diagnosis not present

## 2021-09-16 DIAGNOSIS — D631 Anemia in chronic kidney disease: Secondary | ICD-10-CM

## 2021-09-16 DIAGNOSIS — D509 Iron deficiency anemia, unspecified: Secondary | ICD-10-CM

## 2021-09-16 NOTE — Progress Notes (Signed)
Hematology/Oncology Progress note [phone] [phone]      Clinic Day:  09/16/2021  Referring physician: [name], MD  Chief Complaint: Joshua Cherry is a 71 y.o. male presents for anemia due to  stage III chronic kidney disease.  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    Work-up in 10/2014 revealed a hematocrit of 31.8, hemoglobin 10.3, MCV 95, with a normal WBC and platelet count.  B12 and folate were normal.  SPEP was normal on 07/09/2014.  Iron studies were normal with a saturation of 18% and a TIBC of 297. Ferritin was 72.  Erythropoietin was 10.6.  Antinuclear antibody direct was positive with an anti-DNA double-stranded 24 (0-9).  Sedimentation rate was 3.  Haptoglobin was 181. Coombs was negative.    Work-up on 08/10/2017 revealed a ferritin of 116.  Iron saturation was 14% with a TIBC of 372.  BUN was 59 with a Cr 1.98 (CrCl 33-38 ml/min).  Epo level was 8.8.  SPEP revealed no monoclonal protein.  Retic was 1%.  Urinalysis revealed no hematuria.  Ferritin was 81 on 11/15/2019.  Labs on 11/19/2019 included an iron saturation of 17% with a TIBC of 347. TSH and B12 were normal. Retic was 1.3%.  He receives Retacrit every 2 weeks (12/13/2019 - 12/27/2019) then monthly   # EGD and colonoscopy noted only reflux in 2015.  Guaiac cards were negative.  Colonoscopy on 05/12/2020 revealed non-bleeding internal hemorrhoids.  He has never had a capsule study.     # 2015  B12 deficiency .    INTERVAL HISTORY Joshua Cherry is a 71 y.o. male who has above history reviewed by me today presents for follow up visit for management of anemia of CKD Patient has received IV Venofer treatments in the past. Patient also has been on erythropoietin replacement therapy with Retacrit.  Last received Retacrit on  07/22/2021. Patient reports feeling at baseline.  Review of Systems  Constitutional:  Negative for appetite change, chills, fatigue, fever and unexpected weight change.  HENT:   Negative for hearing loss and voice change.   Eyes:  Negative for eye problems and icterus.  Respiratory:  Negative for chest tightness, cough and shortness of breath.   Cardiovascular:  Negative for chest pain and leg swelling.  Gastrointestinal:  Negative for abdominal distention and abdominal pain.  Endocrine: Negative for hot flashes.  Genitourinary:  Negative for difficulty urinating, dysuria and frequency.   Musculoskeletal:  Negative for arthralgias.  Skin:  Negative for itching and rash.  Neurological:  Negative for light-headedness and numbness.  Hematological:  Negative for adenopathy. Does not bruise/bleed easily.  Psychiatric/Behavioral:  Negative for confusion.       Past Medical History:  Diagnosis Date   Anemia    Chickenpox    Chronic kidney disease    Diabetes mellitus without complication (HCC)    GERD (gastroesophageal reflux disease)    Heart murmur    followed by PCP   Hypertension    Prostate enlargement    Torn meniscus    right    Past Surgical History:  Procedure Laterality Date   CATARACT EXTRACTION W/PHACO Left 09/08/2016   Procedure: CATARACT EXTRACTION PHACO AND INTRAOCULAR LENS PLACEMENT ([place]);  Surgeon: Leandrew Koyanagi, MD;  Location: [facility];  Service: Ophthalmology;  Laterality: Left;  DIABETIC left   CATARACT EXTRACTION  W/PHACO Right 09/29/2016   Procedure: CATARACT EXTRACTION PHACO AND INTRAOCULAR LENS PLACEMENT ([facility])  right eye;  Surgeon: Leandrew Koyanagi, MD;  Location: [facility];  Service: Ophthalmology;  Laterality: Right;  Diabetic - oral meds Right Eye IVA Topical   COLONOSCOPY     COLONOSCOPY     COLONOSCOPY WITH PROPOFOL N/A 05/12/2020   Procedure: COLONOSCOPY WITH PROPOFOL;  Surgeon: Toledo, Benay Pike, MD;  Location: [facility]  ENDOSCOPY;  Service: Gastroenterology;  Laterality: N/A;   ESOPHAGOGASTRODUODENOSCOPY     ESOPHAGOGASTRODUODENOSCOPY      Family History  Problem Relation Age of Onset   Diabetes Mother    Hypertension Mother    Colon cancer Mother    Hyperlipidemia Mother    CAD Mother    Glaucoma Mother    Heart disease Mother    Heart attack Mother    Stroke Mother    Diabetes Father    Alzheimer's disease Father    Hypertension Father    Hyperlipidemia Father    Hip fracture Father    Diabetes Sister    Glaucoma Sister    Cancer Maternal Grandmother     Social History:  reports that he has never smoked. He has never used smokeless tobacco. He reports that he does not drink alcohol and does not use drugs.  Allergies: No Known Allergies  Current Medications: Current Outpatient Medications  Medication Sig Dispense Refill   aspirin EC 81 MG tablet Take 81 mg by mouth daily.      B Complex-C (B COMPLEX-VITAMIN C) CAPS Take 1 tablet by mouth daily.      carvedilol (COREG) 3.125 MG tablet Take 3.125 mg by mouth 2 (two) times daily with a meal.      chlorthalidone (HYGROTON) 50 MG tablet Take by mouth.     Cholecalciferol (VITAMIN D3) 2000 UNITS capsule Take 2,000 Units by mouth daily.      finasteride (PROSCAR) 5 MG tablet Take 1 tablet (5 mg total) by mouth daily. 90 tablet 3   glipiZIDE (GLUCOTROL) 10 MG tablet Take by mouth. Take 1 tablet (10 mg total) by mouth 2 (two) times daily before meals     lisinopril (PRINIVIL,ZESTRIL) 40 MG tablet Take 40 mg by mouth daily.     lovastatin (MEVACOR) 40 MG tablet Take 40 mg by mouth at bedtime.     omeprazole (PRILOSEC) 20 MG capsule Take 40 mg by mouth in the morning and at bedtime.      ONETOUCH DELICA LANCETS 40J MISC      ONETOUCH ULTRA test strip USE ONCE DAILY AS DIRECTED   WHAT MACHINE  NOTHING SINCE 2018     pioglitazone (ACTOS) 45 MG tablet Take 45 mg by mouth daily.      sitaGLIPtin (JANUVIA) 50 MG tablet TAKE 1 TABLET BY MOUTH EVERY DAY      spironolactone (ALDACTONE) 25 MG tablet Take 25 mg by mouth daily. Client states he is taking 1/2 tablet daily     tamsulosin (FLOMAX) 0.4 MG CAPS capsule Take 1 capsule (0.4 mg total) by mouth daily after breakfast. 90 capsule 3   vitamin B-12 (CYANOCOBALAMIN) 1000 MCG tablet Take 1,000 mcg by mouth daily.     No current facility-administered medications for this visit.     Performance status (ECOG): 0   Vitals:  Blood pressure (!) 138/49, pulse 67, temperature (!) 97.1 F (36.2 C), resp. rate 18, weight 184 lb 8 oz (83.7 kg).  Physical Exam Constitutional:      General:  He is not in acute distress.    Appearance: He is not diaphoretic.     Comments: Ambulates independently  HENT:     Head: Normocephalic and atraumatic.     Nose: Nose normal.     Mouth/Throat:     Pharynx: No oropharyngeal exudate.  Eyes:     General: No scleral icterus.    Pupils: Pupils are equal, round, and reactive to light.  Cardiovascular:     Rate and Rhythm: Normal rate and regular rhythm.     Heart sounds: Murmur heard.  Pulmonary:     Effort: Pulmonary effort is normal. No respiratory distress.     Breath sounds: No rales.  Chest:     Chest wall: No tenderness.  Abdominal:     General: There is no distension.     Palpations: Abdomen is soft.     Tenderness: There is no abdominal tenderness.  Musculoskeletal:        General: Normal range of motion.     Cervical back: Normal range of motion and neck supple.  Skin:    General: Skin is warm and dry.     Findings: No erythema.  Neurological:     Mental Status: He is alert and oriented to person, place, and time.     Cranial Nerves: No cranial nerve deficit.     Motor: No abnormal muscle tone.     Coordination: Coordination normal.  Psychiatric:        Mood and Affect: Affect normal.      Assessment and Plan:  1. Iron deficiency anemia, unspecified iron deficiency anemia type   2. Anemia of chronic kidney failure, stage 3 (moderate)  ([place])   3. B12 deficiency    #  Anemia of chronic renal disease Labs reviewed and discussed with patient Hemoglobin has improved to 11.2. Will hold off Retacrit treatments today Iron panel also shows adequate ferritin at 302.  No IV Venofer needed.  # History of B12 deficiency He can continue vitamin B12 supplementation.   Follow up   Lab H&H in 8 weeks +/- Retacrit Lab 16 weeks CBC iron TIBC ferritin MD +/- Retacrit or Venofer.   I discussed the assessment and treatment plan with the patient.  The patient was provided an opportunity to ask questions and all were answered.  The patient agreed with the plan and demonstrated an understanding of the instructions.  The patient was advised to call back or seek an in person evaluation if the symptoms worsen or if the condition fails to improve as anticipated.   Earlie Server, MD, PhD 09/16/2021

## 2021-09-16 NOTE — Progress Notes (Signed)
Patient here for follow up. Pt reports that he never took folic acid because it was on back order and he just forgot.

## 2021-10-26 ENCOUNTER — Telehealth: Payer: Self-pay | Admitting: *Deleted

## 2021-10-26 NOTE — Telephone Encounter (Signed)
Patient needs to reschedule appointment. ?

## 2021-10-29 ENCOUNTER — Encounter: Payer: Self-pay | Admitting: Urgent Care

## 2021-10-29 DIAGNOSIS — E113293 Type 2 diabetes mellitus with mild nonproliferative diabetic retinopathy without macular edema, bilateral: Secondary | ICD-10-CM | POA: Diagnosis not present

## 2021-10-29 DIAGNOSIS — Z961 Presence of intraocular lens: Secondary | ICD-10-CM | POA: Diagnosis not present

## 2021-10-30 DIAGNOSIS — N184 Chronic kidney disease, stage 4 (severe): Secondary | ICD-10-CM | POA: Diagnosis not present

## 2021-10-30 DIAGNOSIS — I10 Essential (primary) hypertension: Secondary | ICD-10-CM | POA: Diagnosis not present

## 2021-11-05 DIAGNOSIS — N184 Chronic kidney disease, stage 4 (severe): Secondary | ICD-10-CM | POA: Diagnosis not present

## 2021-11-05 DIAGNOSIS — Z6827 Body mass index (BMI) 27.0-27.9, adult: Secondary | ICD-10-CM | POA: Diagnosis not present

## 2021-11-05 DIAGNOSIS — I10 Essential (primary) hypertension: Secondary | ICD-10-CM | POA: Diagnosis not present

## 2021-11-05 DIAGNOSIS — E139 Other specified diabetes mellitus without complications: Secondary | ICD-10-CM | POA: Diagnosis not present

## 2021-11-05 DIAGNOSIS — N1832 Chronic kidney disease, stage 3b: Secondary | ICD-10-CM | POA: Diagnosis not present

## 2021-11-05 DIAGNOSIS — D631 Anemia in chronic kidney disease: Secondary | ICD-10-CM | POA: Diagnosis not present

## 2021-11-11 ENCOUNTER — Other Ambulatory Visit: Payer: Self-pay | Admitting: *Deleted

## 2021-11-11 DIAGNOSIS — D509 Iron deficiency anemia, unspecified: Secondary | ICD-10-CM

## 2021-11-16 ENCOUNTER — Ambulatory Visit: Payer: HMO

## 2021-11-16 ENCOUNTER — Other Ambulatory Visit: Payer: HMO

## 2021-11-18 ENCOUNTER — Inpatient Hospital Stay: Payer: HMO | Attending: Oncology

## 2021-11-18 ENCOUNTER — Other Ambulatory Visit: Payer: Self-pay

## 2021-11-18 ENCOUNTER — Inpatient Hospital Stay: Payer: HMO

## 2021-11-18 DIAGNOSIS — N184 Chronic kidney disease, stage 4 (severe): Secondary | ICD-10-CM | POA: Insufficient documentation

## 2021-11-18 DIAGNOSIS — D631 Anemia in chronic kidney disease: Secondary | ICD-10-CM | POA: Insufficient documentation

## 2021-11-18 DIAGNOSIS — E538 Deficiency of other specified B group vitamins: Secondary | ICD-10-CM | POA: Diagnosis not present

## 2021-11-18 DIAGNOSIS — D509 Iron deficiency anemia, unspecified: Secondary | ICD-10-CM | POA: Insufficient documentation

## 2021-11-18 LAB — HEMOGLOBIN AND HEMATOCRIT, BLOOD
HCT: 32.2 % — ABNORMAL LOW (ref 39.0–52.0)
Hemoglobin: 10.1 g/dL — ABNORMAL LOW (ref 13.0–17.0)

## 2021-11-18 LAB — IRON AND TIBC
Iron: 84 ug/dL (ref 45–182)
Saturation Ratios: 25 % (ref 17.9–39.5)
TIBC: 336 ug/dL (ref 250–450)
UIBC: 252 ug/dL

## 2021-11-19 DIAGNOSIS — E785 Hyperlipidemia, unspecified: Secondary | ICD-10-CM | POA: Diagnosis not present

## 2021-11-19 DIAGNOSIS — E1159 Type 2 diabetes mellitus with other circulatory complications: Secondary | ICD-10-CM | POA: Diagnosis not present

## 2021-11-19 DIAGNOSIS — E1169 Type 2 diabetes mellitus with other specified complication: Secondary | ICD-10-CM | POA: Diagnosis not present

## 2021-11-19 DIAGNOSIS — I152 Hypertension secondary to endocrine disorders: Secondary | ICD-10-CM | POA: Diagnosis not present

## 2021-11-19 DIAGNOSIS — E1122 Type 2 diabetes mellitus with diabetic chronic kidney disease: Secondary | ICD-10-CM | POA: Diagnosis not present

## 2021-11-19 DIAGNOSIS — N183 Chronic kidney disease, stage 3 unspecified: Secondary | ICD-10-CM | POA: Diagnosis not present

## 2021-12-07 DIAGNOSIS — E1121 Type 2 diabetes mellitus with diabetic nephropathy: Secondary | ICD-10-CM | POA: Diagnosis not present

## 2021-12-07 DIAGNOSIS — D631 Anemia in chronic kidney disease: Secondary | ICD-10-CM | POA: Diagnosis not present

## 2021-12-07 DIAGNOSIS — I10 Essential (primary) hypertension: Secondary | ICD-10-CM | POA: Diagnosis not present

## 2021-12-07 DIAGNOSIS — N401 Enlarged prostate with lower urinary tract symptoms: Secondary | ICD-10-CM | POA: Diagnosis not present

## 2021-12-07 DIAGNOSIS — N1832 Chronic kidney disease, stage 3b: Secondary | ICD-10-CM | POA: Diagnosis not present

## 2021-12-07 DIAGNOSIS — Z0000 Encounter for general adult medical examination without abnormal findings: Secondary | ICD-10-CM | POA: Diagnosis not present

## 2021-12-07 DIAGNOSIS — K219 Gastro-esophageal reflux disease without esophagitis: Secondary | ICD-10-CM | POA: Diagnosis not present

## 2021-12-07 DIAGNOSIS — E538 Deficiency of other specified B group vitamins: Secondary | ICD-10-CM | POA: Diagnosis not present

## 2021-12-07 DIAGNOSIS — E785 Hyperlipidemia, unspecified: Secondary | ICD-10-CM | POA: Diagnosis not present

## 2021-12-21 ENCOUNTER — Other Ambulatory Visit: Payer: HMO

## 2021-12-21 DIAGNOSIS — N400 Enlarged prostate without lower urinary tract symptoms: Secondary | ICD-10-CM | POA: Diagnosis not present

## 2021-12-22 ENCOUNTER — Telehealth: Payer: Self-pay | Admitting: *Deleted

## 2021-12-22 LAB — PSA: Prostate Specific Ag, Serum: 2.6 ng/mL (ref 0.0–4.0)

## 2021-12-22 NOTE — Telephone Encounter (Addendum)
Patient informed, voiced understanding.  ? ? ?----- Message from Hollice Espy, MD sent at 12/22/2021  7:50 AM EDT ----- ?PSA is lower, 2.6.  Great news. ? ?Hollice Espy, MD ? ?

## 2021-12-29 ENCOUNTER — Encounter: Payer: Self-pay | Admitting: Urgent Care

## 2022-01-12 ENCOUNTER — Inpatient Hospital Stay: Payer: HMO | Attending: Oncology

## 2022-01-12 ENCOUNTER — Other Ambulatory Visit: Payer: Self-pay

## 2022-01-12 DIAGNOSIS — D631 Anemia in chronic kidney disease: Secondary | ICD-10-CM | POA: Insufficient documentation

## 2022-01-12 DIAGNOSIS — N1832 Chronic kidney disease, stage 3b: Secondary | ICD-10-CM | POA: Insufficient documentation

## 2022-01-12 DIAGNOSIS — D509 Iron deficiency anemia, unspecified: Secondary | ICD-10-CM

## 2022-01-12 LAB — FERRITIN: Ferritin: 203 ng/mL (ref 24–336)

## 2022-01-12 LAB — CBC WITH DIFFERENTIAL/PLATELET
Abs Immature Granulocytes: 0.02 10*3/uL (ref 0.00–0.07)
Basophils Absolute: 0 10*3/uL (ref 0.0–0.1)
Basophils Relative: 0 %
Eosinophils Absolute: 0.5 10*3/uL (ref 0.0–0.5)
Eosinophils Relative: 10 %
HCT: 27.8 % — ABNORMAL LOW (ref 39.0–52.0)
Hemoglobin: 9 g/dL — ABNORMAL LOW (ref 13.0–17.0)
Immature Granulocytes: 0 %
Lymphocytes Relative: 18 %
Lymphs Abs: 0.9 10*3/uL (ref 0.7–4.0)
MCH: 32.7 pg (ref 26.0–34.0)
MCHC: 32.4 g/dL (ref 30.0–36.0)
MCV: 101.1 fL — ABNORMAL HIGH (ref 80.0–100.0)
Monocytes Absolute: 0.7 10*3/uL (ref 0.1–1.0)
Monocytes Relative: 15 %
Neutro Abs: 2.8 10*3/uL (ref 1.7–7.7)
Neutrophils Relative %: 57 %
Platelets: 173 10*3/uL (ref 150–400)
RBC: 2.75 MIL/uL — ABNORMAL LOW (ref 4.22–5.81)
RDW: 13.7 % (ref 11.5–15.5)
WBC: 5 10*3/uL (ref 4.0–10.5)
nRBC: 0 % (ref 0.0–0.2)

## 2022-01-12 LAB — IRON AND TIBC
Iron: 62 ug/dL (ref 45–182)
Saturation Ratios: 20 % (ref 17.9–39.5)
TIBC: 315 ug/dL (ref 250–450)
UIBC: 253 ug/dL

## 2022-01-14 ENCOUNTER — Inpatient Hospital Stay: Payer: HMO | Admitting: Oncology

## 2022-01-14 ENCOUNTER — Inpatient Hospital Stay: Payer: HMO

## 2022-01-14 ENCOUNTER — Encounter: Payer: Self-pay | Admitting: Oncology

## 2022-01-14 VITALS — BP 118/57 | HR 59 | Temp 97.1°F | Wt 186.0 lb

## 2022-01-14 DIAGNOSIS — D509 Iron deficiency anemia, unspecified: Secondary | ICD-10-CM

## 2022-01-14 DIAGNOSIS — D631 Anemia in chronic kidney disease: Secondary | ICD-10-CM | POA: Diagnosis not present

## 2022-01-14 DIAGNOSIS — N183 Chronic kidney disease, stage 3 unspecified: Secondary | ICD-10-CM | POA: Diagnosis not present

## 2022-01-14 DIAGNOSIS — E538 Deficiency of other specified B group vitamins: Secondary | ICD-10-CM

## 2022-01-14 DIAGNOSIS — N1832 Chronic kidney disease, stage 3b: Secondary | ICD-10-CM | POA: Diagnosis not present

## 2022-01-14 MED ORDER — EPOETIN ALFA-EPBX 20000 UNIT/ML IJ SOLN
20000.0000 [IU] | Freq: Once | INTRAMUSCULAR | Status: AC
  Administered 2022-01-14: 20000 [IU] via SUBCUTANEOUS
  Filled 2022-01-14: qty 1

## 2022-01-14 MED ORDER — FERROUS SULFATE 325 (65 FE) MG PO TBEC: 325.0000 mg | DELAYED_RELEASE_TABLET | ORAL | 1 refills | Status: DC

## 2022-01-14 NOTE — Progress Notes (Signed)
Hematology/Oncology Progress note [phone] [phone]         Clinic Day:  01/14/2022  Referring physician: [name], MD  Chief Complaint: Joshua Cherry is a 71 y.o. male presents for anemia due to  stage III chronic kidney disease.  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    Work-up in 10/2014 revealed a hematocrit of 31.8, hemoglobin 10.3, MCV 95, with a normal WBC and platelet count.  B12 and folate were normal.  SPEP was normal on 07/09/2014.  Iron studies were normal with a saturation of 18% and a TIBC of 297. Ferritin was 72.  Erythropoietin was 10.6.  Antinuclear antibody direct was positive with an anti-DNA double-stranded 24 (0-9).  Sedimentation rate was 3.  Haptoglobin was 181. Coombs was negative.    Work-up on 08/10/2017 revealed a ferritin of 116.  Iron saturation was 14% with a TIBC of 372.  BUN was 59 with a Cr 1.98 (CrCl 33-38 ml/min).  Epo level was 8.8.  SPEP revealed no monoclonal protein.  Retic was 1%.  Urinalysis revealed no hematuria.  Ferritin was 81 on 11/15/2019.  Labs on 11/19/2019 included an iron saturation of 17% with a TIBC of 347. TSH and B12 were normal. Retic was 1.3%.  He receives Retacrit every 2 weeks (12/13/2019 - 12/27/2019) then monthly   # EGD and colonoscopy noted only reflux in 2015.  Guaiac cards were negative.  Colonoscopy on 05/12/2020 revealed non-bleeding internal hemorrhoids.  He has never had a capsule study.     # 2015  B12 deficiency .    INTERVAL HISTORY Joshua Cherry is a 71 y.o. male who has above history reviewed by me today presents for follow up visit for management of anemia of CKD #1 well.  Denies any nausea vomiting diarrhea.  Fatigue level is stable Patient has chronic bilateral lower extremity leg swelling.  He attributes to "  working all night long".  Today swelling is not bad.  Review of Systems  Constitutional:  Negative for appetite change, chills, fatigue, fever and unexpected weight change.  HENT:   Negative for hearing loss and voice change.   Eyes:  Negative for eye problems and icterus.  Respiratory:  Negative for chest tightness, cough and shortness of breath.   Cardiovascular:  Negative for chest pain and leg swelling.  Gastrointestinal:  Negative for abdominal distention and abdominal pain.  Endocrine: Negative for hot flashes.  Genitourinary:  Negative for difficulty urinating, dysuria and frequency.   Musculoskeletal:  Negative for arthralgias.  Skin:  Negative for itching and rash.  Neurological:  Negative for light-headedness and numbness.  Hematological:  Negative for adenopathy. Does not bruise/bleed easily.  Psychiatric/Behavioral:  Negative for confusion.       Past Medical History:  Diagnosis Date   Anemia    Chickenpox    Chronic kidney disease    Diabetes mellitus without complication (HCC)    GERD (gastroesophageal reflux disease)    Heart murmur    followed by PCP   Hypertension    Prostate enlargement    Torn meniscus    right    Past Surgical History:  Procedure Laterality Date   CATARACT EXTRACTION W/PHACO Left 09/08/2016   Procedure: CATARACT EXTRACTION PHACO AND INTRAOCULAR LENS PLACEMENT ([place]);  Surgeon: Leandrew Koyanagi, MD;  Location: [facility];  Service: Ophthalmology;  Laterality: Left;  DIABETIC left   CATARACT EXTRACTION W/PHACO Right 09/29/2016   Procedure: CATARACT EXTRACTION PHACO AND INTRAOCULAR LENS PLACEMENT ([place])  right eye;  Surgeon: Leandrew Koyanagi, MD;  Location: [place];  Service: Ophthalmology;  Laterality: Right;  Diabetic - oral meds Right Eye IVA Topical   COLONOSCOPY     COLONOSCOPY     COLONOSCOPY WITH PROPOFOL N/A 05/12/2020   Procedure: COLONOSCOPY WITH PROPOFOL;  Surgeon: Toledo, Benay Pike, MD;  Location: [facility]  ENDOSCOPY;  Service: Gastroenterology;  Laterality: N/A;   ESOPHAGOGASTRODUODENOSCOPY     ESOPHAGOGASTRODUODENOSCOPY      Family History  Problem Relation Age of Onset   Diabetes Mother    Hypertension Mother    Colon cancer Mother    Hyperlipidemia Mother    CAD Mother    Glaucoma Mother    Heart disease Mother    Heart attack Mother    Stroke Mother    Diabetes Father    Alzheimer's disease Father    Hypertension Father    Hyperlipidemia Father    Hip fracture Father    Diabetes Sister    Glaucoma Sister    Cancer Maternal Grandmother     Social History:  reports that he has never smoked. He has never used smokeless tobacco. He reports that he does not drink alcohol and does not use drugs.  Allergies: No Known Allergies  Current Medications: Current Outpatient Medications  Medication Sig Dispense Refill   aspirin EC 81 MG tablet Take 81 mg by mouth daily.      B Complex-C (B COMPLEX-VITAMIN C) CAPS Take 1 tablet by mouth daily.      carvedilol (COREG) 3.125 MG tablet Take 3.125 mg by mouth 2 (two) times daily with a meal.      chlorthalidone (HYGROTON) 50 MG tablet Take by mouth.     Cholecalciferol (VITAMIN D3) 2000 UNITS capsule Take 2,000 Units by mouth daily.      ferrous sulfate 325 (65 FE) MG EC tablet Take 1 tablet (325 mg total) by mouth every other day. 30 tablet 1   finasteride (PROSCAR) 5 MG tablet Take 1 tablet (5 mg total) by mouth daily. 90 tablet 3   glipiZIDE (GLUCOTROL) 10 MG tablet Take by mouth. Take 1 tablet (10 mg total) by mouth 2 (two) times daily before meals     lisinopril (PRINIVIL,ZESTRIL) 40 MG tablet Take 40 mg by mouth daily.     lovastatin (MEVACOR) 40 MG tablet Take 40 mg by mouth at bedtime.     omeprazole (PRILOSEC) 20 MG capsule Take 40 mg by mouth in the morning and at bedtime.      ONETOUCH DELICA LANCETS 79G MISC      ONETOUCH ULTRA test strip USE ONCE DAILY AS DIRECTED   WHAT MACHINE  NOTHING SINCE 2018     pioglitazone (ACTOS) 45  MG tablet Take 45 mg by mouth daily.      sitaGLIPtin (JANUVIA) 50 MG tablet TAKE 1 TABLET BY MOUTH EVERY DAY     spironolactone (ALDACTONE) 25 MG tablet Take 25 mg by mouth daily. Client states he is taking 1/2 tablet daily     tamsulosin (FLOMAX) 0.4 MG CAPS capsule Take 1 capsule (0.4 mg total) by mouth daily after breakfast. 90 capsule 3   vitamin B-12 (CYANOCOBALAMIN) 1000 MCG tablet Take 1,000 mcg by mouth daily.     No current facility-administered medications for this visit.     Vitals:  Blood pressure (!) 118/57, pulse Marland Kitchen)  59, temperature (!) 97.1 F (36.2 C), temperature source Tympanic, weight 186 lb (84.4 kg).  Physical Exam Constitutional:      General: He is not in acute distress.    Appearance: He is not diaphoretic.     Comments: Ambulates independently  HENT:     Head: Normocephalic and atraumatic.     Nose: Nose normal.     Mouth/Throat:     Pharynx: No oropharyngeal exudate.  Eyes:     General: No scleral icterus.    Pupils: Pupils are equal, round, and reactive to light.  Cardiovascular:     Rate and Rhythm: Normal rate and regular rhythm.     Heart sounds: Murmur heard.  Pulmonary:     Effort: Pulmonary effort is normal. No respiratory distress.     Breath sounds: No rales.  Chest:     Chest wall: No tenderness.  Abdominal:     General: There is no distension.     Palpations: Abdomen is soft.     Tenderness: There is no abdominal tenderness.  Musculoskeletal:        General: Normal range of motion.     Cervical back: Normal range of motion and neck supple.     Comments:  trace edema lower extremity bilaterally  Skin:    General: Skin is warm and dry.     Findings: No erythema.  Neurological:     Mental Status: He is alert and oriented to person, place, and time.     Cranial Nerves: No cranial nerve deficit.     Motor: No abnormal muscle tone.     Coordination: Coordination normal.  Psychiatric:        Mood and Affect: Affect normal.       Assessment and Plan:  1. Iron deficiency anemia, unspecified iron deficiency anemia type   2. Anemia of chronic kidney failure, stage 3 (moderate) ([place])   3. B12 deficiency    #  Anemia of chronic renal disease Labs reviewed and discussed with patient. Hemoglobin 9.0. Recommend to proceed with Retacrit 10,000 units today. Iron panel showed stable iron stores.  I recommend patient to take ferrous sulfate 325 mg every other day for maintenance.  Prescription sent to pharmacy.  # History of B12 deficiency Recommend vitamin B12 supplementation.   Follow up   Lab H&H in 8 weeks +/- Retacrit Lab 16 weeks CBC iron TIBC ferritin MD +/- Retacrit or Venofer.   I discussed the assessment and treatment plan with the patient.  The patient was provided an opportunity to ask questions and all were answered.  The patient agreed with the plan and demonstrated an understanding of the instructions.  The patient was advised to call back or seek an in person evaluation if the symptoms worsen or if the condition fails to improve as anticipated.   Earlie Server, MD, PhD 01/14/2022

## 2022-03-11 ENCOUNTER — Inpatient Hospital Stay: Payer: HMO | Attending: Oncology

## 2022-03-11 ENCOUNTER — Encounter: Payer: Self-pay | Admitting: Urgent Care

## 2022-03-11 ENCOUNTER — Inpatient Hospital Stay: Payer: HMO

## 2022-03-11 DIAGNOSIS — D509 Iron deficiency anemia, unspecified: Secondary | ICD-10-CM

## 2022-03-11 DIAGNOSIS — D631 Anemia in chronic kidney disease: Secondary | ICD-10-CM | POA: Diagnosis not present

## 2022-03-11 DIAGNOSIS — N1832 Chronic kidney disease, stage 3b: Secondary | ICD-10-CM | POA: Insufficient documentation

## 2022-03-11 DIAGNOSIS — E538 Deficiency of other specified B group vitamins: Secondary | ICD-10-CM | POA: Diagnosis not present

## 2022-03-11 LAB — HEMOGLOBIN AND HEMATOCRIT, BLOOD
HCT: 30.6 % — ABNORMAL LOW (ref 39.0–52.0)
Hemoglobin: 10.1 g/dL — ABNORMAL LOW (ref 13.0–17.0)

## 2022-04-01 DIAGNOSIS — E1122 Type 2 diabetes mellitus with diabetic chronic kidney disease: Secondary | ICD-10-CM | POA: Diagnosis not present

## 2022-04-01 DIAGNOSIS — E1169 Type 2 diabetes mellitus with other specified complication: Secondary | ICD-10-CM | POA: Diagnosis not present

## 2022-04-01 DIAGNOSIS — E785 Hyperlipidemia, unspecified: Secondary | ICD-10-CM | POA: Diagnosis not present

## 2022-04-01 DIAGNOSIS — N183 Chronic kidney disease, stage 3 unspecified: Secondary | ICD-10-CM | POA: Diagnosis not present

## 2022-04-01 DIAGNOSIS — E1159 Type 2 diabetes mellitus with other circulatory complications: Secondary | ICD-10-CM | POA: Diagnosis not present

## 2022-04-01 DIAGNOSIS — I152 Hypertension secondary to endocrine disorders: Secondary | ICD-10-CM | POA: Diagnosis not present

## 2022-04-16 ENCOUNTER — Other Ambulatory Visit: Payer: Self-pay | Admitting: Oncology

## 2022-04-27 ENCOUNTER — Encounter: Payer: Self-pay | Admitting: Urgent Care

## 2022-05-04 ENCOUNTER — Inpatient Hospital Stay: Payer: HMO

## 2022-05-04 ENCOUNTER — Inpatient Hospital Stay: Payer: HMO | Attending: Oncology

## 2022-05-04 DIAGNOSIS — D631 Anemia in chronic kidney disease: Secondary | ICD-10-CM | POA: Diagnosis not present

## 2022-05-04 DIAGNOSIS — Z800 Family history of malignant neoplasm of digestive organs: Secondary | ICD-10-CM | POA: Diagnosis not present

## 2022-05-04 DIAGNOSIS — E1122 Type 2 diabetes mellitus with diabetic chronic kidney disease: Secondary | ICD-10-CM | POA: Diagnosis not present

## 2022-05-04 DIAGNOSIS — E538 Deficiency of other specified B group vitamins: Secondary | ICD-10-CM | POA: Insufficient documentation

## 2022-05-04 DIAGNOSIS — N184 Chronic kidney disease, stage 4 (severe): Secondary | ICD-10-CM | POA: Insufficient documentation

## 2022-05-04 DIAGNOSIS — D509 Iron deficiency anemia, unspecified: Secondary | ICD-10-CM | POA: Diagnosis not present

## 2022-05-04 DIAGNOSIS — I129 Hypertensive chronic kidney disease with stage 1 through stage 4 chronic kidney disease, or unspecified chronic kidney disease: Secondary | ICD-10-CM | POA: Insufficient documentation

## 2022-05-04 LAB — CBC WITH DIFFERENTIAL/PLATELET
Abs Immature Granulocytes: 0.02 10*3/uL (ref 0.00–0.07)
Basophils Absolute: 0 10*3/uL (ref 0.0–0.1)
Basophils Relative: 1 %
Eosinophils Absolute: 0.3 10*3/uL (ref 0.0–0.5)
Eosinophils Relative: 6 %
HCT: 30.9 % — ABNORMAL LOW (ref 39.0–52.0)
Hemoglobin: 10.2 g/dL — ABNORMAL LOW (ref 13.0–17.0)
Immature Granulocytes: 0 %
Lymphocytes Relative: 18 %
Lymphs Abs: 1 10*3/uL (ref 0.7–4.0)
MCH: 32.6 pg (ref 26.0–34.0)
MCHC: 33 g/dL (ref 30.0–36.0)
MCV: 98.7 fL (ref 80.0–100.0)
Monocytes Absolute: 0.8 10*3/uL (ref 0.1–1.0)
Monocytes Relative: 15 %
Neutro Abs: 3.3 10*3/uL (ref 1.7–7.7)
Neutrophils Relative %: 60 %
Platelets: 174 10*3/uL (ref 150–400)
RBC: 3.13 MIL/uL — ABNORMAL LOW (ref 4.22–5.81)
RDW: 13.4 % (ref 11.5–15.5)
WBC: 5.5 10*3/uL (ref 4.0–10.5)
nRBC: 0 % (ref 0.0–0.2)

## 2022-05-04 LAB — FERRITIN: Ferritin: 181 ng/mL (ref 24–336)

## 2022-05-04 LAB — IRON AND TIBC
Iron: 79 ug/dL (ref 45–182)
Saturation Ratios: 23 % (ref 17.9–39.5)
TIBC: 340 ug/dL (ref 250–450)
UIBC: 261 ug/dL

## 2022-05-04 LAB — VITAMIN B12: Vitamin B-12: 912 pg/mL (ref 180–914)

## 2022-05-06 ENCOUNTER — Encounter: Payer: Self-pay | Admitting: Oncology

## 2022-05-06 ENCOUNTER — Inpatient Hospital Stay: Payer: HMO | Admitting: Oncology

## 2022-05-06 ENCOUNTER — Inpatient Hospital Stay: Payer: HMO

## 2022-05-06 VITALS — BP 159/63 | HR 62 | Temp 96.2°F | Resp 18 | Wt 188.0 lb

## 2022-05-06 DIAGNOSIS — D509 Iron deficiency anemia, unspecified: Secondary | ICD-10-CM | POA: Diagnosis not present

## 2022-05-06 DIAGNOSIS — D631 Anemia in chronic kidney disease: Secondary | ICD-10-CM | POA: Diagnosis not present

## 2022-05-06 DIAGNOSIS — N1832 Chronic kidney disease, stage 3b: Secondary | ICD-10-CM | POA: Diagnosis not present

## 2022-05-06 DIAGNOSIS — E538 Deficiency of other specified B group vitamins: Secondary | ICD-10-CM

## 2022-05-06 NOTE — Progress Notes (Signed)
Pt here for follow up. No new concerns voiced.   

## 2022-05-06 NOTE — Progress Notes (Signed)
Hematology/Oncology Progress note [phone] [phone]         Clinic Day:  05/06/2022  Referring physician: [name], MD  Chief Complaint: Joshua Cherry is a 71 y.o. male presents for anemia due to  stage III chronic kidney disease.  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    Work-up in 10/2014 revealed a hematocrit of 31.8, hemoglobin 10.3, MCV 95, with a normal WBC and platelet count.  B12 and folate were normal.  SPEP was normal on 07/09/2014.  Iron studies were normal with a saturation of 18% and a TIBC of 297. Ferritin was 72.  Erythropoietin was 10.6.  Antinuclear antibody direct was positive with an anti-DNA double-stranded 24 (0-9).  Sedimentation rate was 3.  Haptoglobin was 181. Coombs was negative.    Work-up on 08/10/2017 revealed a ferritin of 116.  Iron saturation was 14% with a TIBC of 372.  BUN was 59 with a Cr 1.98 (CrCl 33-38 ml/min).  Epo level was 8.8.  SPEP revealed no monoclonal protein.  Retic was 1%.  Urinalysis revealed no hematuria.  Ferritin was 81 on 11/15/2019.  Labs on 11/19/2019 included an iron saturation of 17% with a TIBC of 347. TSH and B12 were normal. Retic was 1.3%.  He receives Retacrit every 2 weeks (12/13/2019 - 12/27/2019) then monthly   # EGD and colonoscopy noted only reflux in 2015.  Guaiac cards were negative.  Colonoscopy on 05/12/2020 revealed non-bleeding internal hemorrhoids.  He has never had a capsule study.     # 2015  B12 deficiency .  Patient has chronic bilateral lower extremity leg swelling.    INTERVAL HISTORY Joshua Cherry is a 71 y.o. male who has above history reviewed by me today presents for follow up visit for management of anemia of CKD Fatigue level is stable, has not required retacrit recently. Last dose was in May 2023.   Review  of Systems  Constitutional:  Negative for appetite change, chills, fatigue, fever and unexpected weight change.  HENT:   Negative for hearing loss and voice change.   Eyes:  Negative for eye problems and icterus.  Respiratory:  Negative for chest tightness, cough and shortness of breath.   Cardiovascular:  Negative for chest pain and leg swelling.  Gastrointestinal:  Negative for abdominal distention and abdominal pain.  Endocrine: Negative for hot flashes.  Genitourinary:  Negative for difficulty urinating, dysuria and frequency.   Musculoskeletal:  Negative for arthralgias.  Skin:  Negative for itching and rash.  Neurological:  Negative for light-headedness and numbness.  Hematological:  Negative for adenopathy. Does not bruise/bleed easily.  Psychiatric/Behavioral:  Negative for confusion.        Past Medical History:  Diagnosis Date   Anemia    Chickenpox    Chronic kidney disease    Diabetes mellitus without complication (HCC)    GERD (gastroesophageal reflux disease)    Heart murmur    followed by PCP   Hypertension    Prostate enlargement    Torn meniscus    right    Past Surgical History:  Procedure Laterality Date   CATARACT EXTRACTION W/PHACO Left 09/08/2016   Procedure: CATARACT EXTRACTION PHACO AND INTRAOCULAR LENS PLACEMENT ([place]);  Surgeon: Leandrew Koyanagi, MD;  Location: [place];  Service: Ophthalmology;  Laterality: Left;  DIABETIC left   CATARACT EXTRACTION W/PHACO  Right 09/29/2016   Procedure: CATARACT EXTRACTION PHACO AND INTRAOCULAR LENS PLACEMENT ([facility])  right eye;  Surgeon: Leandrew Koyanagi, MD;  Location: [place];  Service: Ophthalmology;  Laterality: Right;  Diabetic - oral meds Right Eye IVA Topical   COLONOSCOPY     COLONOSCOPY     COLONOSCOPY WITH PROPOFOL N/A 05/12/2020   Procedure: COLONOSCOPY WITH PROPOFOL;  Surgeon: Toledo, Benay Pike, MD;  Location: [facility] ENDOSCOPY;  Service: Gastroenterology;  Laterality: N/A;    ESOPHAGOGASTRODUODENOSCOPY     ESOPHAGOGASTRODUODENOSCOPY      Family History  Problem Relation Age of Onset   Diabetes Mother    Hypertension Mother    Colon cancer Mother    Hyperlipidemia Mother    CAD Mother    Glaucoma Mother    Heart disease Mother    Heart attack Mother    Stroke Mother    Diabetes Father    Alzheimer's disease Father    Hypertension Father    Hyperlipidemia Father    Hip fracture Father    Diabetes Sister    Glaucoma Sister    Cancer Maternal Grandmother     Social History:  reports that he has never smoked. He has never used smokeless tobacco. He reports that he does not drink alcohol and does not use drugs.  Allergies: No Known Allergies  Current Medications: Current Outpatient Medications  Medication Sig Dispense Refill   aspirin EC 81 MG tablet Take 81 mg by mouth daily.      B Complex-C (B COMPLEX-VITAMIN C) CAPS Take 1 tablet by mouth daily.      carvedilol (COREG) 3.125 MG tablet Take 3.125 mg by mouth 2 (two) times daily with a meal.      chlorthalidone (HYGROTON) 50 MG tablet Take by mouth.     Cholecalciferol (VITAMIN D3) 2000 UNITS capsule Take 2,000 Units by mouth daily.      ferrous sulfate 325 (65 FE) MG EC tablet TAKE 1 TABLET BY MOUTH EVERY OTHER DAY 45 tablet 1   finasteride (PROSCAR) 5 MG tablet Take 1 tablet (5 mg total) by mouth daily. 90 tablet 3   glipiZIDE (GLUCOTROL) 10 MG tablet Take by mouth. Take 1 tablet (10 mg total) by mouth 2 (two) times daily before meals     lisinopril (PRINIVIL,ZESTRIL) 40 MG tablet Take 40 mg by mouth daily.     lovastatin (MEVACOR) 40 MG tablet Take 40 mg by mouth at bedtime.     omeprazole (PRILOSEC) 20 MG capsule Take 40 mg by mouth in the morning and at bedtime.      ONETOUCH DELICA LANCETS 89Q MISC      ONETOUCH ULTRA test strip USE ONCE DAILY AS DIRECTED   WHAT MACHINE  NOTHING SINCE 2018     pioglitazone (ACTOS) 45 MG tablet Take 45 mg by mouth daily.      sitaGLIPtin (JANUVIA) 50 MG  tablet TAKE 1 TABLET BY MOUTH EVERY DAY     spironolactone (ALDACTONE) 25 MG tablet Take 25 mg by mouth daily. Client states he is taking 1/2 tablet daily     tamsulosin (FLOMAX) 0.4 MG CAPS capsule Take 1 capsule (0.4 mg total) by mouth daily after breakfast. 90 capsule 3   vitamin B-12 (CYANOCOBALAMIN) 1000 MCG tablet Take 1,000 mcg by mouth daily.     No current facility-administered medications for this visit.     Vitals:  Blood pressure (!) 159/63, pulse 62, temperature (!) 96.2 F (35.7 C), resp. rate 18, weight 188 lb (  85.3 kg).  Physical Exam Constitutional:      General: He is not in acute distress.    Appearance: He is not diaphoretic.     Comments: Ambulates independently  HENT:     Head: Normocephalic and atraumatic.     Nose: Nose normal.     Mouth/Throat:     Pharynx: No oropharyngeal exudate.  Eyes:     General: No scleral icterus.    Pupils: Pupils are equal, round, and reactive to light.  Cardiovascular:     Rate and Rhythm: Normal rate and regular rhythm.     Heart sounds: Murmur heard.  Pulmonary:     Effort: Pulmonary effort is normal. No respiratory distress.     Breath sounds: No rales.  Chest:     Chest wall: No tenderness.  Abdominal:     General: There is no distension.     Palpations: Abdomen is soft.     Tenderness: There is no abdominal tenderness.  Musculoskeletal:        General: Normal range of motion.     Cervical back: Normal range of motion and neck supple.     Comments:  trace edema lower extremity bilaterally  Skin:    General: Skin is warm and dry.     Findings: No erythema.  Neurological:     Mental Status: He is alert and oriented to person, place, and time.     Cranial Nerves: No cranial nerve deficit.     Motor: No abnormal muscle tone.     Coordination: Coordination normal.  Psychiatric:        Mood and Affect: Affect normal.       Assessment and Plan:  1. Iron deficiency anemia, unspecified iron deficiency anemia  type   2. Anemia of chronic kidney failure, stage 3 (moderate) ([place])   3. B12 deficiency    # Anemia of chronic renal disease Labs reviewed and discussed with patient. Hemoglobin 10.2 No need for retacrit today Iron panel showed ferritin 181 Recommend IV venofer x 2 to further improve his iron store.   continue ferrous sulfate 325 mg every other day for maintenance.    # History of B12 deficiency, stable and normal B12 level Recommend vitamin B12 supplementation.   Follow up   Lab H&H in 8 weeks +/- Retacrit Lab 16 weeks CBC iron TIBC ferritin MD +/- Retacrit or Venofer.   I discussed the assessment and treatment plan with the patient.  The patient was provided an opportunity to ask questions and all were answered.  The patient agreed with the plan and demonstrated an understanding of the instructions.  The patient was advised to call back or seek an in person evaluation if the symptoms worsen or if the condition fails to improve as anticipated.   Earlie Server, MD, PhD 05/06/2022

## 2022-05-11 MED FILL — Iron Sucrose Inj 20 MG/ML (Fe Equiv): INTRAVENOUS | Qty: 10 | Status: AC

## 2022-05-12 ENCOUNTER — Inpatient Hospital Stay: Payer: HMO

## 2022-05-12 VITALS — BP 134/54 | HR 59 | Temp 96.0°F | Resp 20

## 2022-05-12 DIAGNOSIS — D509 Iron deficiency anemia, unspecified: Secondary | ICD-10-CM | POA: Diagnosis not present

## 2022-05-12 MED ORDER — SODIUM CHLORIDE 0.9 % IV SOLN
200.0000 mg | Freq: Once | INTRAVENOUS | Status: AC
  Administered 2022-05-12: 200 mg via INTRAVENOUS
  Filled 2022-05-12: qty 10

## 2022-05-12 MED ORDER — SODIUM CHLORIDE 0.9 % IV SOLN
Freq: Once | INTRAVENOUS | Status: AC
  Filled 2022-05-12: qty 250

## 2022-05-17 DIAGNOSIS — N184 Chronic kidney disease, stage 4 (severe): Secondary | ICD-10-CM | POA: Diagnosis not present

## 2022-05-17 DIAGNOSIS — I10 Essential (primary) hypertension: Secondary | ICD-10-CM | POA: Diagnosis not present

## 2022-05-19 ENCOUNTER — Inpatient Hospital Stay: Payer: HMO

## 2022-05-19 VITALS — BP 137/51 | HR 63 | Temp 96.5°F | Resp 18

## 2022-05-19 DIAGNOSIS — D509 Iron deficiency anemia, unspecified: Secondary | ICD-10-CM

## 2022-05-19 MED ORDER — SODIUM CHLORIDE 0.9 % IV SOLN
Freq: Once | INTRAVENOUS | Status: AC
  Filled 2022-05-19: qty 250

## 2022-05-19 MED ORDER — SODIUM CHLORIDE 0.9 % IV SOLN
200.0000 mg | Freq: Once | INTRAVENOUS | Status: AC
  Administered 2022-05-19: 200 mg via INTRAVENOUS
  Filled 2022-05-19: qty 200

## 2022-05-20 DIAGNOSIS — D631 Anemia in chronic kidney disease: Secondary | ICD-10-CM | POA: Diagnosis not present

## 2022-05-20 DIAGNOSIS — E139 Other specified diabetes mellitus without complications: Secondary | ICD-10-CM | POA: Diagnosis not present

## 2022-05-20 DIAGNOSIS — Z6827 Body mass index (BMI) 27.0-27.9, adult: Secondary | ICD-10-CM | POA: Diagnosis not present

## 2022-05-20 DIAGNOSIS — N1832 Chronic kidney disease, stage 3b: Secondary | ICD-10-CM | POA: Diagnosis not present

## 2022-05-20 DIAGNOSIS — I10 Essential (primary) hypertension: Secondary | ICD-10-CM | POA: Diagnosis not present

## 2022-05-27 ENCOUNTER — Other Ambulatory Visit: Payer: Self-pay | Admitting: *Deleted

## 2022-05-27 DIAGNOSIS — N400 Enlarged prostate without lower urinary tract symptoms: Secondary | ICD-10-CM

## 2022-06-17 ENCOUNTER — Other Ambulatory Visit: Payer: HMO

## 2022-06-17 DIAGNOSIS — N400 Enlarged prostate without lower urinary tract symptoms: Secondary | ICD-10-CM

## 2022-06-18 LAB — PSA: Prostate Specific Ag, Serum: 3 ng/mL (ref 0.0–4.0)

## 2022-06-22 ENCOUNTER — Ambulatory Visit: Payer: HMO | Admitting: Urology

## 2022-06-29 ENCOUNTER — Encounter: Payer: Self-pay | Admitting: Urology

## 2022-06-29 ENCOUNTER — Ambulatory Visit: Payer: HMO | Admitting: Urology

## 2022-06-29 VITALS — BP 146/63 | HR 65 | Ht 72.0 in | Wt 188.0 lb

## 2022-06-29 DIAGNOSIS — N400 Enlarged prostate without lower urinary tract symptoms: Secondary | ICD-10-CM | POA: Diagnosis not present

## 2022-06-29 LAB — BLADDER SCAN AMB NON-IMAGING

## 2022-06-29 MED ORDER — FINASTERIDE 5 MG PO TABS: 5.0000 mg | ORAL_TABLET | Freq: Every day | ORAL | 3 refills | Status: DC

## 2022-06-29 MED ORDER — TAMSULOSIN HCL 0.4 MG PO CAPS: 0.4000 mg | ORAL_CAPSULE | Freq: Every day | ORAL | 3 refills | Status: DC

## 2022-06-29 NOTE — Progress Notes (Signed)
06/29/2022 9:44 AM   Joshua Cherry [birth year] [id number]  Referring provider: [name], MD [place],  [place] [address]  Chief Complaint  Patient presents with   Benign Prostatic Hypertrophy    HPI: 71 year old male with a personal history of BPH with LUTS who returns today for routine annual follow-up.  He continues to be managed on Flomax and finasteride.  His urinary symptoms are well controlled, IPSS is below.  He is also emptying well.  Because he is on finasteride, also following his PSA.  Most recent PSA is 3.0, previously 2.63 months ago and 3.5 a year ago.  Results for orders placed or performed in visit on 06/29/22  Bladder Scan (Post Void Residual) in office  Result Value Ref Range   Scan Result 40ML       IPSS     Row Name 06/29/22 0900         International Prostate Symptom Score   How often have you had the sensation of not emptying your bladder? Not at All     How often have you had to urinate less than every two hours? Less than 1 in 5 times     How often have you found you stopped and started again several times when you urinated? Less than 1 in 5 times     How often have you found it difficult to postpone urination? Not at All     How often have you had a weak urinary stream? Not at All     How often have you had to strain to start urination? Not at All     How many times did you typically get up at night to urinate? 1 Time     Total IPSS Score 3       Quality of Life due to urinary symptoms   If you were to spend the rest of your life with your urinary condition just the way it is now how would you feel about that? Mostly Satisfied              Score:  1-7 Mild 8-19 Moderate 20-35 Severe   PMH: Past Medical History:  Diagnosis Date   Anemia    Chickenpox    Chronic kidney disease    Diabetes mellitus without complication (HCC)    GERD (gastroesophageal reflux disease)    Heart murmur    followed by PCP    Hypertension    Prostate enlargement    Torn meniscus    right    Surgical History: Past Surgical History:  Procedure Laterality Date   CATARACT EXTRACTION W/PHACO Left 09/08/2016   Procedure: CATARACT EXTRACTION PHACO AND INTRAOCULAR LENS PLACEMENT ([place]);  Surgeon: Leandrew Koyanagi, MD;  Location: [facility];  Service: Ophthalmology;  Laterality: Left;  DIABETIC left   CATARACT EXTRACTION W/PHACO Right 09/29/2016   Procedure: CATARACT EXTRACTION PHACO AND INTRAOCULAR LENS PLACEMENT ([place])  right eye;  Surgeon: Leandrew Koyanagi, MD;  Location: [facility];  Service: Ophthalmology;  Laterality: Right;  Diabetic - oral meds Right Eye IVA Topical   COLONOSCOPY     COLONOSCOPY     COLONOSCOPY WITH PROPOFOL N/A 05/12/2020   Procedure: COLONOSCOPY WITH PROPOFOL;  Surgeon: Toledo, Benay Pike, MD;  Location: [facility];  Service: Gastroenterology;  Laterality: N/A;   ESOPHAGOGASTRODUODENOSCOPY     ESOPHAGOGASTRODUODENOSCOPY      Home Medications:  Allergies as of 06/29/2022   No Known Allergies  Medication List        Accurate as of June 29, 2022  9:44 AM. If you have any questions, ask your nurse or doctor.          aspirin EC 81 MG tablet Take 81 mg by mouth daily.   B Complex-Vitamin C Caps Take 1 tablet by mouth daily.   carvedilol 3.125 MG tablet Commonly known as: COREG Take 3.125 mg by mouth 2 (two) times daily with a meal.   chlorthalidone 50 MG tablet Commonly known as: HYGROTON Take by mouth.   cyanocobalamin 1000 MCG tablet Commonly known as: VITAMIN B12 Take 1,000 mcg by mouth daily.   ferrous sulfate 325 (65 FE) MG EC tablet TAKE 1 TABLET BY MOUTH EVERY OTHER DAY   finasteride 5 MG tablet Commonly known as: PROSCAR Take 1 tablet (5 mg total) by mouth daily.   glipiZIDE 10 MG tablet Commonly known as: GLUCOTROL Take by mouth. Take 1 tablet (10 mg total) by mouth 2 (two) times daily before meals   lisinopril 40 MG  tablet Commonly known as: ZESTRIL Take 40 mg by mouth daily.   lovastatin 40 MG tablet Commonly known as: MEVACOR Take 40 mg by mouth at bedtime.   omeprazole 20 MG capsule Commonly known as: PRILOSEC Take 40 mg by mouth in the morning and at bedtime.   OneTouch Delica Lancets 97W Misc   OneTouch Ultra test strip Generic drug: glucose Cherry USE ONCE DAILY AS DIRECTED   WHAT MACHINE  NOTHING SINCE 2018   pioglitazone 45 MG tablet Commonly known as: ACTOS Take 45 mg by mouth daily.   sitaGLIPtin 50 MG tablet Commonly known as: JANUVIA TAKE 1 TABLET BY MOUTH EVERY DAY   spironolactone 25 MG tablet Commonly known as: ALDACTONE Take 25 mg by mouth daily. Client states he is taking 1/2 tablet daily   tamsulosin 0.4 MG Caps capsule Commonly known as: FLOMAX Take 1 capsule (0.4 mg total) by mouth daily after breakfast.   Vitamin D3 50 MCG (2000 UT) capsule Take 2,000 Units by mouth daily.        Allergies: No Known Allergies  Family History: Family History  Problem Relation Age of Onset   Diabetes Mother    Hypertension Mother    Colon cancer Mother    Hyperlipidemia Mother    CAD Mother    Glaucoma Mother    Heart disease Mother    Heart attack Mother    Stroke Mother    Diabetes Father    Alzheimer's disease Father    Hypertension Father    Hyperlipidemia Father    Hip fracture Father    Diabetes Sister    Glaucoma Sister    Cancer Maternal Grandmother     Social History:  reports that he has never smoked. He has never used smokeless tobacco. He reports that he does not drink alcohol and does not use drugs.   Physical Exam: BP (!) 146/63   Pulse 65   Ht 6' (1.829 m)   Wt 188 lb (85.3 kg)   BMI 25.50 kg/m   Constitutional:  Alert and oriented, No acute distress. HEENT: [place] AT, moist mucus membranes.  Trachea midline, no masses. Cardiovascular: No clubbing, cyanosis, or edema. Respiratory: Normal respiratory effort, no increased work of  breathing. Rectal: 40+ cc prostate, non-tender, no nodules Neurologic: Grossly intact, no focal deficits, moving all 4 extremities. Psychiatric: Normal mood and affect.  Laboratory Data: Lab Results  Component Value Date   WBC 5.5  05/04/2022   HGB 10.2 (L) 05/04/2022   HCT 30.9 (L) 05/04/2022   MCV 98.7 05/04/2022   PLT 174 05/04/2022    Lab Results  Component Value Date   CREATININE 2.04 (H) 04/01/2021    Lab Results  Component Value Date   HGBA1C 7.6 (H) 12/16/2017     Assessment & Plan:    1. Benign prostatic hyperplasia, unspecified whether lower urinary tract symptoms present Symptoms stable, well controlled on maximal medical therapy, Flomax and finasteride  PSA fluctuating but stable, will continue to follow annually   Return in about 1 year (around 06/30/2023) for IPSS/ PVR/ , PSA/ DRE with Sam or Shanon.  Hollice Espy, MD  [facility] [address] [place], [place] [address] [phone]

## 2022-07-01 ENCOUNTER — Inpatient Hospital Stay: Payer: HMO

## 2022-07-01 ENCOUNTER — Inpatient Hospital Stay: Payer: HMO | Attending: Oncology

## 2022-07-01 VITALS — BP 161/71 | HR 60 | Temp 96.9°F | Resp 18

## 2022-07-01 DIAGNOSIS — D631 Anemia in chronic kidney disease: Secondary | ICD-10-CM | POA: Diagnosis not present

## 2022-07-01 DIAGNOSIS — I129 Hypertensive chronic kidney disease with stage 1 through stage 4 chronic kidney disease, or unspecified chronic kidney disease: Secondary | ICD-10-CM | POA: Diagnosis not present

## 2022-07-01 DIAGNOSIS — N1832 Chronic kidney disease, stage 3b: Secondary | ICD-10-CM

## 2022-07-01 DIAGNOSIS — E1122 Type 2 diabetes mellitus with diabetic chronic kidney disease: Secondary | ICD-10-CM | POA: Diagnosis not present

## 2022-07-01 DIAGNOSIS — D509 Iron deficiency anemia, unspecified: Secondary | ICD-10-CM

## 2022-07-01 DIAGNOSIS — N184 Chronic kidney disease, stage 4 (severe): Secondary | ICD-10-CM | POA: Insufficient documentation

## 2022-07-01 LAB — HEMOGLOBIN AND HEMATOCRIT, BLOOD
HCT: 29.7 % — ABNORMAL LOW (ref 39.0–52.0)
Hemoglobin: 9.6 g/dL — ABNORMAL LOW (ref 13.0–17.0)

## 2022-07-01 MED ORDER — EPOETIN ALFA-EPBX 20000 UNIT/ML IJ SOLN
20000.0000 [IU] | Freq: Once | INTRAMUSCULAR | Status: AC
  Administered 2022-07-01: 20000 [IU] via SUBCUTANEOUS
  Filled 2022-07-01: qty 1

## 2022-07-01 NOTE — Progress Notes (Signed)
Blood pressure 170/64 ; recheck 161/71   Per v/o Dr. Tasia Catchings- ok to proceed with retacrit today.

## 2022-09-03 MED FILL — Iron Sucrose Inj 20 MG/ML (Fe Equiv): INTRAVENOUS | Qty: 10 | Status: AC

## 2022-09-06 ENCOUNTER — Inpatient Hospital Stay: Payer: PPO

## 2022-09-06 ENCOUNTER — Encounter: Payer: Self-pay | Admitting: Oncology

## 2022-09-06 ENCOUNTER — Inpatient Hospital Stay: Payer: PPO | Admitting: Oncology

## 2022-09-06 ENCOUNTER — Inpatient Hospital Stay: Payer: PPO | Attending: Oncology

## 2022-09-06 ENCOUNTER — Encounter: Payer: Self-pay | Admitting: Urgent Care

## 2022-09-06 VITALS — BP 172/51 | HR 63 | Resp 18 | Ht 72.0 in | Wt 191.0 lb

## 2022-09-06 DIAGNOSIS — N183 Chronic kidney disease, stage 3 unspecified: Secondary | ICD-10-CM

## 2022-09-06 DIAGNOSIS — D509 Iron deficiency anemia, unspecified: Secondary | ICD-10-CM | POA: Diagnosis not present

## 2022-09-06 DIAGNOSIS — R5383 Other fatigue: Secondary | ICD-10-CM | POA: Diagnosis not present

## 2022-09-06 DIAGNOSIS — E538 Deficiency of other specified B group vitamins: Secondary | ICD-10-CM | POA: Diagnosis not present

## 2022-09-06 DIAGNOSIS — I129 Hypertensive chronic kidney disease with stage 1 through stage 4 chronic kidney disease, or unspecified chronic kidney disease: Secondary | ICD-10-CM | POA: Insufficient documentation

## 2022-09-06 DIAGNOSIS — M7989 Other specified soft tissue disorders: Secondary | ICD-10-CM | POA: Diagnosis not present

## 2022-09-06 DIAGNOSIS — N184 Chronic kidney disease, stage 4 (severe): Secondary | ICD-10-CM | POA: Diagnosis not present

## 2022-09-06 DIAGNOSIS — Z800 Family history of malignant neoplasm of digestive organs: Secondary | ICD-10-CM | POA: Diagnosis not present

## 2022-09-06 DIAGNOSIS — E1122 Type 2 diabetes mellitus with diabetic chronic kidney disease: Secondary | ICD-10-CM | POA: Insufficient documentation

## 2022-09-06 DIAGNOSIS — D631 Anemia in chronic kidney disease: Secondary | ICD-10-CM | POA: Diagnosis not present

## 2022-09-06 DIAGNOSIS — N1832 Chronic kidney disease, stage 3b: Secondary | ICD-10-CM

## 2022-09-06 LAB — CBC WITH DIFFERENTIAL/PLATELET
Abs Immature Granulocytes: 0.02 10*3/uL (ref 0.00–0.07)
Basophils Absolute: 0 10*3/uL (ref 0.0–0.1)
Basophils Relative: 1 %
Eosinophils Absolute: 0.3 10*3/uL (ref 0.0–0.5)
Eosinophils Relative: 5 %
HCT: 31.5 % — ABNORMAL LOW (ref 39.0–52.0)
Hemoglobin: 10.2 g/dL — ABNORMAL LOW (ref 13.0–17.0)
Immature Granulocytes: 0 %
Lymphocytes Relative: 23 %
Lymphs Abs: 1.3 10*3/uL (ref 0.7–4.0)
MCH: 32.1 pg (ref 26.0–34.0)
MCHC: 32.4 g/dL (ref 30.0–36.0)
MCV: 99.1 fL (ref 80.0–100.0)
Monocytes Absolute: 0.8 10*3/uL (ref 0.1–1.0)
Monocytes Relative: 14 %
Neutro Abs: 3.1 10*3/uL (ref 1.7–7.7)
Neutrophils Relative %: 57 %
Platelets: 174 10*3/uL (ref 150–400)
RBC: 3.18 MIL/uL — ABNORMAL LOW (ref 4.22–5.81)
RDW: 12.9 % (ref 11.5–15.5)
WBC: 5.5 10*3/uL (ref 4.0–10.5)
nRBC: 0 % (ref 0.0–0.2)

## 2022-09-06 LAB — IRON AND TIBC
Iron: 71 ug/dL (ref 45–182)
Saturation Ratios: 21 % (ref 17.9–39.5)
TIBC: 337 ug/dL (ref 250–450)
UIBC: 266 ug/dL

## 2022-09-06 LAB — FERRITIN: Ferritin: 266 ng/mL (ref 24–336)

## 2022-09-06 NOTE — Progress Notes (Signed)
Hematology/Oncology Progress note [phone] [phone]     ASSESSMENT & PLAN:   Anemia of chronic kidney failure, stage 3 (moderate) (HCC) # Anemia of chronic renal disease Labs reviewed and discussed with patient. Hemoglobin 10.2 No need for retacrit today Iron panel showed ferritin is at goal.  continue ferrous sulfate 325 mg every other day for maintenance.    Orders Placed This Encounter  Procedures   Hemoglobin and Hematocrit, Blood    Standing Status:   Future    Standing Expiration Date:   09/07/2023   CBC with Differential/Platelet    Standing Status:   Future    Standing Expiration Date:   09/06/2023   Iron and TIBC([facility])    Standing Status:   Future    Standing Expiration Date:   09/07/2023   Ferritin    Standing Status:   Future    Standing Expiration Date:   09/07/2023   Follow up  8 weeks H&H +/- retacrit 4 months, labs prior to MD +/- Venofer +/- retacrit - iron labs.   All questions were answered. The patient knows to call the clinic with any problems, questions or concerns.  Earlie Server, MD, PhD [facility] Hematology Oncology 09/06/2022   Chief Complaint: Joshua Cherry is a 72 y.o. male presents for anemia due to  stage III chronic kidney disease.  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    Work-up in 10/2014 revealed a hematocrit of 31.8, hemoglobin 10.3, MCV 95, with a normal WBC and platelet count.  B12 and folate were normal.  SPEP was normal on 07/09/2014.  Iron studies were normal with a saturation of 18% and a TIBC of 297. Ferritin was 72.  Erythropoietin was 10.6.  Antinuclear antibody direct was positive with an anti-DNA double-stranded 24 (0-9).  Sedimentation rate was 3.  Haptoglobin was 181. Coombs was negative.    Work-up on 08/10/2017 revealed a ferritin of 116.   Iron saturation was 14% with a TIBC of 372.  BUN was 59 with a Cr 1.98 (CrCl 33-38 ml/min).  Epo level was 8.8.  SPEP revealed no monoclonal protein.  Retic was 1%.  Urinalysis revealed no hematuria.  Ferritin was 81 on 11/15/2019.  Labs on 11/19/2019 included an iron saturation of 17% with a TIBC of 347. TSH and B12 were normal. Retic was 1.3%.  He receives Retacrit every 2 weeks (12/13/2019 - 12/27/2019) then monthly   # EGD and colonoscopy noted only reflux in 2015.  Guaiac cards were negative.  Colonoscopy on 05/12/2020 revealed non-bleeding internal hemorrhoids.  He has never had a capsule study.     # 2015  B12 deficiency .  Patient has chronic bilateral lower extremity leg swelling.    INTERVAL HISTORY Joshua Cherry is a 72 y.o. male who has above history reviewed by me today presents for follow up visit for management of anemia of CKD Fatigue level is stable, no new complaints.  Denies any blood in his stool, stomach pain, lightheaded   Review of Systems  Constitutional:  Negative for appetite change, chills, fatigue, fever and unexpected weight change.  HENT:   Negative for hearing loss and voice change.   Eyes:  Negative for eye problems and icterus.  Respiratory:  Negative for chest tightness, cough and shortness of breath.   Cardiovascular:  Negative for chest pain and leg  swelling.  Gastrointestinal:  Negative for abdominal distention and abdominal pain.  Endocrine: Negative for hot flashes.  Genitourinary:  Negative for difficulty urinating, dysuria and frequency.   Musculoskeletal:  Negative for arthralgias.  Skin:  Negative for itching and rash.  Neurological:  Negative for light-headedness and numbness.  Hematological:  Negative for adenopathy. Does not bruise/bleed easily.  Psychiatric/Behavioral:  Negative for confusion.        Past Medical History:  Diagnosis Date   Anemia    Chickenpox    Chronic kidney disease    Diabetes mellitus without complication  (HCC)    GERD (gastroesophageal reflux disease)    Heart murmur    followed by PCP   Hypertension    Prostate enlargement    Torn meniscus    right    Past Surgical History:  Procedure Laterality Date   CATARACT EXTRACTION W/PHACO Left 09/08/2016   Procedure: CATARACT EXTRACTION PHACO AND INTRAOCULAR LENS PLACEMENT ([place]);  Surgeon: Leandrew Koyanagi, MD;  Location: [facility];  Service: Ophthalmology;  Laterality: Left;  DIABETIC left   CATARACT EXTRACTION W/PHACO Right 09/29/2016   Procedure: CATARACT EXTRACTION PHACO AND INTRAOCULAR LENS PLACEMENT ([place])  right eye;  Surgeon: Leandrew Koyanagi, MD;  Location: [facility];  Service: Ophthalmology;  Laterality: Right;  Diabetic - oral meds Right Eye IVA Topical   COLONOSCOPY     COLONOSCOPY     COLONOSCOPY WITH PROPOFOL N/A 05/12/2020   Procedure: COLONOSCOPY WITH PROPOFOL;  Surgeon: Toledo, Benay Pike, MD;  Location: [facility];  Service: Gastroenterology;  Laterality: N/A;   ESOPHAGOGASTRODUODENOSCOPY     ESOPHAGOGASTRODUODENOSCOPY      Family History  Problem Relation Age of Onset   Diabetes Mother    Hypertension Mother    Colon cancer Mother    Hyperlipidemia Mother    CAD Mother    Glaucoma Mother    Heart disease Mother    Heart attack Mother    Stroke Mother    Diabetes Father    Alzheimer's disease Father    Hypertension Father    Hyperlipidemia Father    Hip fracture Father    Diabetes Sister    Glaucoma Sister    Cancer Maternal Grandmother     Social History:  reports that he has never smoked. He has never used smokeless tobacco. He reports that he does not drink alcohol and does not use drugs.  Allergies: No Known Allergies  Current Medications: Current Outpatient Medications  Medication Sig Dispense Refill   aspirin EC 81 MG tablet Take 81 mg by mouth daily.      B Complex-C (B COMPLEX-VITAMIN C) CAPS Take 1 tablet by mouth daily.      carvedilol (COREG) 3.125 MG tablet Take  3.125 mg by mouth 2 (two) times daily with a meal.      Cholecalciferol (VITAMIN D3) 2000 UNITS capsule Take 2,000 Units by mouth daily.      ferrous sulfate 325 (65 FE) MG EC tablet TAKE 1 TABLET BY MOUTH EVERY OTHER DAY 45 tablet 1   finasteride (PROSCAR) 5 MG tablet Take 1 tablet (5 mg total) by mouth daily. 90 tablet 3   glipiZIDE (GLUCOTROL) 10 MG tablet Take by mouth. Take 1 tablet (10 mg total) by mouth 2 (two) times daily before meals     lisinopril (PRINIVIL,ZESTRIL) 40 MG tablet Take 40 mg by mouth daily.     lovastatin (MEVACOR) 40 MG tablet Take 40 mg by mouth at bedtime.     omeprazole (  PRILOSEC) 20 MG capsule Take 40 mg by mouth in the morning and at bedtime.      ONETOUCH DELICA LANCETS 59D MISC      ONETOUCH ULTRA test strip USE ONCE DAILY AS DIRECTED   WHAT MACHINE  NOTHING SINCE 2018     pioglitazone (ACTOS) 45 MG tablet Take 45 mg by mouth daily.      sitaGLIPtin (JANUVIA) 50 MG tablet TAKE 1 TABLET BY MOUTH EVERY DAY     spironolactone (ALDACTONE) 25 MG tablet Take 25 mg by mouth daily. Client states he is taking 1/2 tablet daily     tamsulosin (FLOMAX) 0.4 MG CAPS capsule Take 1 capsule (0.4 mg total) by mouth daily after breakfast. 90 capsule 3   vitamin B-12 (CYANOCOBALAMIN) 1000 MCG tablet Take 1,000 mcg by mouth daily.     chlorthalidone (HYGROTON) 50 MG tablet Take by mouth.     No current facility-administered medications for this visit.     Vitals:  Blood pressure (!) 172/51, pulse 63, resp. rate 18, height 6' (1.829 m), weight 191 lb (86.6 kg), SpO2 100 %.  Physical Exam Constitutional:      General: He is not in acute distress.    Appearance: He is not diaphoretic.     Comments: Ambulates independently  HENT:     Head: Normocephalic and atraumatic.     Nose: Nose normal.     Mouth/Throat:     Pharynx: No oropharyngeal exudate.  Eyes:     General: No scleral icterus.    Pupils: Pupils are equal, round, and reactive to light.  Cardiovascular:     Rate  and Rhythm: Normal rate and regular rhythm.     Heart sounds: Murmur heard.  Pulmonary:     Effort: Pulmonary effort is normal. No respiratory distress.     Breath sounds: No rales.  Chest:     Chest wall: No tenderness.  Abdominal:     General: There is no distension.     Palpations: Abdomen is soft.     Tenderness: There is no abdominal tenderness.  Musculoskeletal:        General: Normal range of motion.     Cervical back: Normal range of motion and neck supple.     Comments:  trace edema lower extremity bilaterally  Skin:    General: Skin is warm and dry.     Findings: No erythema.  Neurological:     Mental Status: He is alert and oriented to person, place, and time.     Cranial Nerves: No cranial nerve deficit.     Motor: No abnormal muscle tone.     Coordination: Coordination normal.  Psychiatric:        Mood and Affect: Affect normal.

## 2022-09-06 NOTE — Assessment & Plan Note (Addendum)
#   Anemia of chronic renal disease Labs reviewed and discussed with patient. Hemoglobin 10.2 No need for retacrit today Iron panel showed ferritin is at goal.  continue ferrous sulfate 325 mg every other day for maintenance.

## 2022-09-08 DIAGNOSIS — E785 Hyperlipidemia, unspecified: Secondary | ICD-10-CM | POA: Diagnosis not present

## 2022-09-08 DIAGNOSIS — N183 Chronic kidney disease, stage 3 unspecified: Secondary | ICD-10-CM | POA: Diagnosis not present

## 2022-09-08 DIAGNOSIS — E1159 Type 2 diabetes mellitus with other circulatory complications: Secondary | ICD-10-CM | POA: Diagnosis not present

## 2022-09-08 DIAGNOSIS — E1169 Type 2 diabetes mellitus with other specified complication: Secondary | ICD-10-CM | POA: Diagnosis not present

## 2022-09-08 DIAGNOSIS — I152 Hypertension secondary to endocrine disorders: Secondary | ICD-10-CM | POA: Diagnosis not present

## 2022-09-08 DIAGNOSIS — E1122 Type 2 diabetes mellitus with diabetic chronic kidney disease: Secondary | ICD-10-CM | POA: Diagnosis not present

## 2022-09-27 DIAGNOSIS — M2041 Other hammer toe(s) (acquired), right foot: Secondary | ICD-10-CM | POA: Diagnosis not present

## 2022-09-27 DIAGNOSIS — B351 Tinea unguium: Secondary | ICD-10-CM | POA: Diagnosis not present

## 2022-09-27 DIAGNOSIS — L600 Ingrowing nail: Secondary | ICD-10-CM | POA: Diagnosis not present

## 2022-09-27 DIAGNOSIS — M898X9 Other specified disorders of bone, unspecified site: Secondary | ICD-10-CM | POA: Diagnosis not present

## 2022-09-27 DIAGNOSIS — E119 Type 2 diabetes mellitus without complications: Secondary | ICD-10-CM | POA: Diagnosis not present

## 2022-10-14 DIAGNOSIS — I10 Essential (primary) hypertension: Secondary | ICD-10-CM | POA: Diagnosis not present

## 2022-10-14 DIAGNOSIS — N1832 Chronic kidney disease, stage 3b: Secondary | ICD-10-CM | POA: Diagnosis not present

## 2022-10-21 DIAGNOSIS — N1832 Chronic kidney disease, stage 3b: Secondary | ICD-10-CM | POA: Diagnosis not present

## 2022-10-21 DIAGNOSIS — D631 Anemia in chronic kidney disease: Secondary | ICD-10-CM | POA: Diagnosis not present

## 2022-10-21 DIAGNOSIS — I10 Essential (primary) hypertension: Secondary | ICD-10-CM | POA: Diagnosis not present

## 2022-11-01 ENCOUNTER — Inpatient Hospital Stay: Payer: PPO

## 2022-11-01 DIAGNOSIS — D3132 Benign neoplasm of left choroid: Secondary | ICD-10-CM | POA: Diagnosis not present

## 2022-11-01 DIAGNOSIS — E113293 Type 2 diabetes mellitus with mild nonproliferative diabetic retinopathy without macular edema, bilateral: Secondary | ICD-10-CM | POA: Diagnosis not present

## 2022-11-01 DIAGNOSIS — Z961 Presence of intraocular lens: Secondary | ICD-10-CM | POA: Diagnosis not present

## 2022-11-04 ENCOUNTER — Inpatient Hospital Stay: Payer: PPO

## 2022-11-04 ENCOUNTER — Inpatient Hospital Stay: Payer: PPO | Attending: Oncology

## 2022-11-04 DIAGNOSIS — N1832 Chronic kidney disease, stage 3b: Secondary | ICD-10-CM | POA: Insufficient documentation

## 2022-11-04 DIAGNOSIS — I129 Hypertensive chronic kidney disease with stage 1 through stage 4 chronic kidney disease, or unspecified chronic kidney disease: Secondary | ICD-10-CM | POA: Diagnosis not present

## 2022-11-04 DIAGNOSIS — D509 Iron deficiency anemia, unspecified: Secondary | ICD-10-CM

## 2022-11-04 DIAGNOSIS — D631 Anemia in chronic kidney disease: Secondary | ICD-10-CM | POA: Insufficient documentation

## 2022-11-04 LAB — HEMOGLOBIN AND HEMATOCRIT, BLOOD
HCT: 30.8 % — ABNORMAL LOW (ref 39.0–52.0)
Hemoglobin: 9.9 g/dL — ABNORMAL LOW (ref 13.0–17.0)

## 2022-11-04 MED ORDER — EPOETIN ALFA-EPBX 10000 UNIT/ML IJ SOLN
20000.0000 [IU] | Freq: Once | INTRAMUSCULAR | Status: AC
  Administered 2022-11-04: 20000 [IU] via SUBCUTANEOUS
  Filled 2022-11-04: qty 2

## 2022-11-04 MED ORDER — EPOETIN ALFA-EPBX 40000 UNIT/ML IJ SOLN: 20000.0000 [IU] | Freq: Once | INTRAMUSCULAR | Status: DC

## 2022-12-29 ENCOUNTER — Inpatient Hospital Stay: Payer: PPO | Attending: Oncology

## 2022-12-29 DIAGNOSIS — D631 Anemia in chronic kidney disease: Secondary | ICD-10-CM | POA: Diagnosis not present

## 2022-12-29 DIAGNOSIS — E1122 Type 2 diabetes mellitus with diabetic chronic kidney disease: Secondary | ICD-10-CM | POA: Insufficient documentation

## 2022-12-29 DIAGNOSIS — E538 Deficiency of other specified B group vitamins: Secondary | ICD-10-CM | POA: Diagnosis not present

## 2022-12-29 DIAGNOSIS — I129 Hypertensive chronic kidney disease with stage 1 through stage 4 chronic kidney disease, or unspecified chronic kidney disease: Secondary | ICD-10-CM | POA: Diagnosis not present

## 2022-12-29 DIAGNOSIS — D509 Iron deficiency anemia, unspecified: Secondary | ICD-10-CM

## 2022-12-29 DIAGNOSIS — N184 Chronic kidney disease, stage 4 (severe): Secondary | ICD-10-CM | POA: Insufficient documentation

## 2022-12-29 LAB — CBC WITH DIFFERENTIAL/PLATELET
Abs Immature Granulocytes: 0.07 10*3/uL (ref 0.00–0.07)
Basophils Absolute: 0 10*3/uL (ref 0.0–0.1)
Basophils Relative: 1 %
Eosinophils Absolute: 0.4 10*3/uL (ref 0.0–0.5)
Eosinophils Relative: 7 %
HCT: 32.6 % — ABNORMAL LOW (ref 39.0–52.0)
Hemoglobin: 10.6 g/dL — ABNORMAL LOW (ref 13.0–17.0)
Immature Granulocytes: 1 %
Lymphocytes Relative: 20 %
Lymphs Abs: 1.1 10*3/uL (ref 0.7–4.0)
MCH: 32.5 pg (ref 26.0–34.0)
MCHC: 32.5 g/dL (ref 30.0–36.0)
MCV: 100 fL (ref 80.0–100.0)
Monocytes Absolute: 0.7 10*3/uL (ref 0.1–1.0)
Monocytes Relative: 13 %
Neutro Abs: 3.3 10*3/uL (ref 1.7–7.7)
Neutrophils Relative %: 58 %
Platelets: 205 10*3/uL (ref 150–400)
RBC: 3.26 MIL/uL — ABNORMAL LOW (ref 4.22–5.81)
RDW: 13.4 % (ref 11.5–15.5)
WBC: 5.6 10*3/uL (ref 4.0–10.5)
nRBC: 0 % (ref 0.0–0.2)

## 2022-12-29 LAB — IRON AND TIBC
Iron: 82 ug/dL (ref 45–182)
Saturation Ratios: 22 % (ref 17.9–39.5)
TIBC: 370 ug/dL (ref 250–450)
UIBC: 288 ug/dL

## 2022-12-29 LAB — FERRITIN: Ferritin: 258 ng/mL (ref 24–336)

## 2022-12-30 ENCOUNTER — Other Ambulatory Visit: Payer: PPO

## 2023-01-03 MED FILL — Iron Sucrose Inj 20 MG/ML (Fe Equiv): INTRAVENOUS | Qty: 10 | Status: AC

## 2023-01-04 ENCOUNTER — Encounter: Payer: Self-pay | Admitting: Oncology

## 2023-01-04 ENCOUNTER — Inpatient Hospital Stay: Payer: PPO | Admitting: Oncology

## 2023-01-04 ENCOUNTER — Inpatient Hospital Stay: Payer: PPO

## 2023-01-04 VITALS — BP 149/70 | HR 60 | Temp 96.0°F | Wt 178.2 lb

## 2023-01-04 DIAGNOSIS — I129 Hypertensive chronic kidney disease with stage 1 through stage 4 chronic kidney disease, or unspecified chronic kidney disease: Secondary | ICD-10-CM | POA: Diagnosis not present

## 2023-01-04 DIAGNOSIS — D631 Anemia in chronic kidney disease: Secondary | ICD-10-CM | POA: Diagnosis not present

## 2023-01-04 DIAGNOSIS — N183 Chronic kidney disease, stage 3 unspecified: Secondary | ICD-10-CM

## 2023-01-04 NOTE — Progress Notes (Signed)
Pt. Wanted to bring up that he has been having some leg cramping during the night, ongoing for a while now.

## 2023-01-04 NOTE — Assessment & Plan Note (Addendum)
#   Anemia of chronic renal disease Labs reviewed and discussed with patient. Lab Results  Component Value Date   IRON 82 12/29/2022   TIBC 370 12/29/2022   IRONPCTSAT 22 12/29/2022   FERRITIN 258 12/29/2022   HGB 10.6 (L) 12/29/2022  Hold Venofer as ferritin is at goal.  No need for erythropoietin therapy continue ferrous sulfate 325 mg 2 times per week for maintenance.

## 2023-01-04 NOTE — Progress Notes (Signed)
Hematology/Oncology Progress note [phone] [phone]) [phone]     ASSESSMENT & PLAN:   Anemia of chronic kidney failure, stage 3 (moderate) (HCC) # Anemia of chronic renal disease Labs reviewed and discussed with patient. Lab Results  Component Value Date   IRON 82 12/29/2022   TIBC 370 12/29/2022   IRONPCTSAT 22 12/29/2022   FERRITIN 258 12/29/2022   HGB 10.6 (L) 12/29/2022  Hold Venofer as ferritin is at goal.  No need for erythropoietin therapy continue ferrous sulfate 325 mg 2 times per week for maintenance.    Orders Placed This Encounter  Procedures   CBC with Differential (Cancer Center Only)    Standing Status:   Future    Standing Expiration Date:   01/04/2024   Iron and TIBC    Standing Status:   Future    Standing Expiration Date:   01/04/2024   Ferritin    Standing Status:   Future    Standing Expiration Date:   01/04/2024   Retic Panel    Standing Status:   Future    Standing Expiration Date:   01/04/2024   Follow up  3 months lab H&H +/- retacrit. 6 months, labs prior to MD +/- Venofer +/- retacrit.    All questions were answered. The patient knows to call the clinic with any problems, questions or concerns.  Rickard Patience, MD, PhD [facility] Hematology Oncology 01/04/2023   Chief Complaint: Joshua Cherry is a 72 y.o. male presents for anemia due to  stage III chronic kidney disease.  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    Work-up in 10/2014 revealed a hematocrit of 31.8, hemoglobin 10.3, MCV 95, with a normal WBC and platelet count.  B12 and folate were normal.  SPEP was normal on 07/09/2014.  Iron studies were normal with a saturation of 18% and a TIBC of 297. Ferritin was 72.  Erythropoietin was 10.6.  Antinuclear antibody direct was positive with an anti-DNA double-stranded 24 (0-9).   Sedimentation rate was 3.  Haptoglobin was 181. Coombs was negative.    Work-up on 08/10/2017 revealed a ferritin of 116.  Iron saturation was 14% with a TIBC of 372.  BUN was 59 with a Cr 1.98 (CrCl 33-38 ml/min).  Epo level was 8.8.  SPEP revealed no monoclonal protein.  Retic was 1%.  Urinalysis revealed no hematuria.  Ferritin was 81 on 11/15/2019.  Labs on 11/19/2019 included an iron saturation of 17% with a TIBC of 347. TSH and B12 were normal. Retic was 1.3%.  He receives Retacrit every 2 weeks (12/13/2019 - 12/27/2019) then monthly   # EGD and colonoscopy noted only reflux in 2015.  Guaiac cards were negative.  Colonoscopy on 05/12/2020 revealed non-bleeding internal hemorrhoids.  He has never had a capsule study.     # 2015  B12 deficiency .  Patient has chronic bilateral lower extremity leg swelling.    INTERVAL HISTORY Joshua Cherry is a 72 y.o. male who has above history reviewed by me today presents for follow up visit for management of anemia of CKD Fatigue level is stable, no new complaints.  Denies any blood in his stool, stomach pain, lightheaded   Review of Systems  Constitutional:  Negative for appetite change, chills, fatigue, fever and unexpected weight change.  HENT:   Negative for hearing loss and voice change.  Eyes:  Negative for eye problems and icterus.  Respiratory:  Negative for chest tightness, cough and shortness of breath.   Cardiovascular:  Negative for chest pain and leg swelling.  Gastrointestinal:  Negative for abdominal distention and abdominal pain.  Endocrine: Negative for hot flashes.  Genitourinary:  Negative for difficulty urinating, dysuria and frequency.   Musculoskeletal:  Negative for arthralgias.  Skin:  Negative for itching and rash.  Neurological:  Negative for light-headedness and numbness.  Hematological:  Negative for adenopathy. Does not bruise/bleed easily.  Psychiatric/Behavioral:  Negative for confusion.        Past  Medical History:  Diagnosis Date   Anemia    Chickenpox    Chronic kidney disease    Diabetes mellitus without complication (HCC)    GERD (gastroesophageal reflux disease)    Heart murmur    followed by PCP   Hypertension    Prostate enlargement    Torn meniscus    right    Past Surgical History:  Procedure Laterality Date   CATARACT EXTRACTION W/PHACO Left 09/08/2016   Procedure: CATARACT EXTRACTION PHACO AND INTRAOCULAR LENS PLACEMENT (IOC);  Surgeon: Lockie Mola, MD;  Location: [facility];  Service: Ophthalmology;  Laterality: Left;  DIABETIC left   CATARACT EXTRACTION W/PHACO Right 09/29/2016   Procedure: CATARACT EXTRACTION PHACO AND INTRAOCULAR LENS PLACEMENT (IOC)  right eye;  Surgeon: Lockie Mola, MD;  Location: [facility];  Service: Ophthalmology;  Laterality: Right;  Diabetic - oral meds Right Eye IVA Topical   COLONOSCOPY     COLONOSCOPY     COLONOSCOPY WITH PROPOFOL N/A 05/12/2020   Procedure: COLONOSCOPY WITH PROPOFOL;  Surgeon: Toledo, Boykin Nearing, MD;  Location: [facility];  Service: Gastroenterology;  Laterality: N/A;   ESOPHAGOGASTRODUODENOSCOPY     ESOPHAGOGASTRODUODENOSCOPY      Family History  Problem Relation Age of Onset   Diabetes Mother    Hypertension Mother    Colon cancer Mother    Hyperlipidemia Mother    CAD Mother    Glaucoma Mother    Heart disease Mother    Heart attack Mother    Stroke Mother    Diabetes Father    Alzheimer's disease Father    Hypertension Father    Hyperlipidemia Father    Hip fracture Father    Diabetes Sister    Glaucoma Sister    Cancer Maternal Grandmother     Social History:  reports that he has never smoked. He has never used smokeless tobacco. He reports that he does not drink alcohol and does not use drugs.  Allergies: No Known Allergies  Current Medications: Current Outpatient Medications  Medication Sig Dispense Refill   aspirin EC 81 MG tablet Take 81 mg by mouth  daily.      B Complex-C (B COMPLEX-VITAMIN C) CAPS Take 1 tablet by mouth daily.      carvedilol (COREG) 3.125 MG tablet Take 3.125 mg by mouth 2 (two) times daily with a meal.      chlorthalidone (HYGROTON) 50 MG tablet Take by mouth.     Cholecalciferol (VITAMIN D3) 2000 UNITS capsule Take 2,000 Units by mouth daily.      empagliflozin (JARDIANCE) 25 MG TABS tablet 0.5 tablets daily.     finasteride (PROSCAR) 5 MG tablet Take 1 tablet (5 mg total) by mouth daily. 90 tablet 3   glipiZIDE (GLUCOTROL) 10 MG tablet Take by mouth. Take 1 tablet (10 mg total) by mouth 2 (two) times daily before meals  lisinopril (PRINIVIL,ZESTRIL) 40 MG tablet Take 40 mg by mouth daily.     lovastatin (MEVACOR) 40 MG tablet Take 40 mg by mouth at bedtime.     omeprazole (PRILOSEC) 20 MG capsule Take 40 mg by mouth in the morning and at bedtime.      ONETOUCH DELICA LANCETS 33G MISC      ONETOUCH ULTRA test strip USE ONCE DAILY AS DIRECTED   WHAT MACHINE  NOTHING SINCE 2018     pioglitazone (ACTOS) 45 MG tablet Take 45 mg by mouth daily.      spironolactone (ALDACTONE) 25 MG tablet Take 25 mg by mouth daily. Client states he is taking 1/2 tablet daily     tamsulosin (FLOMAX) 0.4 MG CAPS capsule Take 1 capsule (0.4 mg total) by mouth daily after breakfast. 90 capsule 3   vitamin B-12 (CYANOCOBALAMIN) 1000 MCG tablet Take 1,000 mcg by mouth daily.     ferrous sulfate 325 (65 FE) MG EC tablet TAKE 1 TABLET BY MOUTH EVERY OTHER DAY (Patient not taking: Reported on 01/04/2023) 45 tablet 1   sitaGLIPtin (JANUVIA) 50 MG tablet TAKE 1 TABLET BY MOUTH EVERY DAY (Patient not taking: Reported on 01/04/2023)     No current facility-administered medications for this visit.     Vitals:  Blood pressure (!) 149/70, pulse 60, temperature (!) 96 F (35.6 C), temperature source Tympanic, weight 178 lb 3.2 oz (80.8 kg), SpO2 100 %.  Physical Exam Constitutional:      General: He is not in acute distress.    Appearance: He is not  diaphoretic.     Comments: Ambulates independently  HENT:     Head: Normocephalic and atraumatic.     Nose: Nose normal.     Mouth/Throat:     Pharynx: No oropharyngeal exudate.  Eyes:     General: No scleral icterus.    Pupils: Pupils are equal, round, and reactive to light.  Cardiovascular:     Rate and Rhythm: Normal rate and regular rhythm.     Heart sounds: Murmur heard.  Pulmonary:     Effort: Pulmonary effort is normal. No respiratory distress.     Breath sounds: No rales.  Chest:     Chest wall: No tenderness.  Abdominal:     General: There is no distension.     Palpations: Abdomen is soft.     Tenderness: There is no abdominal tenderness.  Musculoskeletal:        General: Normal range of motion.     Cervical back: Normal range of motion and neck supple.     Comments:  trace edema lower extremity bilaterally  Skin:    General: Skin is warm and dry.     Findings: No erythema.  Neurological:     Mental Status: He is alert and oriented to person, place, and time.     Cranial Nerves: No cranial nerve deficit.     Motor: No abnormal muscle tone.     Coordination: Coordination normal.  Psychiatric:        Mood and Affect: Affect normal.

## 2023-01-18 DIAGNOSIS — N183 Chronic kidney disease, stage 3 unspecified: Secondary | ICD-10-CM | POA: Diagnosis not present

## 2023-01-18 DIAGNOSIS — I152 Hypertension secondary to endocrine disorders: Secondary | ICD-10-CM | POA: Diagnosis not present

## 2023-01-18 DIAGNOSIS — E785 Hyperlipidemia, unspecified: Secondary | ICD-10-CM | POA: Diagnosis not present

## 2023-01-18 DIAGNOSIS — E1122 Type 2 diabetes mellitus with diabetic chronic kidney disease: Secondary | ICD-10-CM | POA: Diagnosis not present

## 2023-01-18 DIAGNOSIS — E1169 Type 2 diabetes mellitus with other specified complication: Secondary | ICD-10-CM | POA: Diagnosis not present

## 2023-01-18 DIAGNOSIS — E1159 Type 2 diabetes mellitus with other circulatory complications: Secondary | ICD-10-CM | POA: Diagnosis not present

## 2023-03-02 DIAGNOSIS — E1121 Type 2 diabetes mellitus with diabetic nephropathy: Secondary | ICD-10-CM | POA: Diagnosis not present

## 2023-03-02 DIAGNOSIS — I10 Essential (primary) hypertension: Secondary | ICD-10-CM | POA: Diagnosis not present

## 2023-03-02 DIAGNOSIS — R252 Cramp and spasm: Secondary | ICD-10-CM | POA: Diagnosis not present

## 2023-03-02 DIAGNOSIS — N401 Enlarged prostate with lower urinary tract symptoms: Secondary | ICD-10-CM | POA: Diagnosis not present

## 2023-03-02 DIAGNOSIS — E785 Hyperlipidemia, unspecified: Secondary | ICD-10-CM | POA: Diagnosis not present

## 2023-03-02 DIAGNOSIS — D631 Anemia in chronic kidney disease: Secondary | ICD-10-CM | POA: Diagnosis not present

## 2023-03-02 DIAGNOSIS — N1832 Chronic kidney disease, stage 3b: Secondary | ICD-10-CM | POA: Diagnosis not present

## 2023-03-02 DIAGNOSIS — E538 Deficiency of other specified B group vitamins: Secondary | ICD-10-CM | POA: Diagnosis not present

## 2023-03-02 DIAGNOSIS — K219 Gastro-esophageal reflux disease without esophagitis: Secondary | ICD-10-CM | POA: Diagnosis not present

## 2023-03-02 DIAGNOSIS — Z0000 Encounter for general adult medical examination without abnormal findings: Secondary | ICD-10-CM | POA: Diagnosis not present

## 2023-04-06 ENCOUNTER — Inpatient Hospital Stay: Payer: PPO | Attending: Oncology

## 2023-04-06 ENCOUNTER — Inpatient Hospital Stay: Payer: PPO

## 2023-04-06 VITALS — BP 115/58

## 2023-04-06 DIAGNOSIS — N183 Chronic kidney disease, stage 3 unspecified: Secondary | ICD-10-CM | POA: Diagnosis not present

## 2023-04-06 DIAGNOSIS — D509 Iron deficiency anemia, unspecified: Secondary | ICD-10-CM

## 2023-04-06 DIAGNOSIS — D631 Anemia in chronic kidney disease: Secondary | ICD-10-CM | POA: Diagnosis not present

## 2023-04-06 LAB — HEMOGLOBIN AND HEMATOCRIT, BLOOD
HCT: 29.7 % — ABNORMAL LOW (ref 39.0–52.0)
Hemoglobin: 9.6 g/dL — ABNORMAL LOW (ref 13.0–17.0)

## 2023-04-06 MED ORDER — EPOETIN ALFA-EPBX 20000 UNIT/ML IJ SOLN
20000.0000 [IU] | Freq: Once | INTRAMUSCULAR | Status: AC
  Administered 2023-04-06: 20000 [IU] via SUBCUTANEOUS
  Filled 2023-04-06: qty 1

## 2023-05-11 DIAGNOSIS — N1832 Chronic kidney disease, stage 3b: Secondary | ICD-10-CM | POA: Diagnosis not present

## 2023-05-11 DIAGNOSIS — I10 Essential (primary) hypertension: Secondary | ICD-10-CM | POA: Diagnosis not present

## 2023-05-18 DIAGNOSIS — I10 Essential (primary) hypertension: Secondary | ICD-10-CM | POA: Diagnosis not present

## 2023-05-18 DIAGNOSIS — D631 Anemia in chronic kidney disease: Secondary | ICD-10-CM | POA: Diagnosis not present

## 2023-05-18 DIAGNOSIS — E875 Hyperkalemia: Secondary | ICD-10-CM | POA: Diagnosis not present

## 2023-05-18 DIAGNOSIS — N1832 Chronic kidney disease, stage 3b: Secondary | ICD-10-CM | POA: Diagnosis not present

## 2023-05-28 ENCOUNTER — Other Ambulatory Visit: Payer: Self-pay | Admitting: Urology

## 2023-06-01 DIAGNOSIS — I152 Hypertension secondary to endocrine disorders: Secondary | ICD-10-CM | POA: Diagnosis not present

## 2023-06-01 DIAGNOSIS — N183 Chronic kidney disease, stage 3 unspecified: Secondary | ICD-10-CM | POA: Diagnosis not present

## 2023-06-01 DIAGNOSIS — E785 Hyperlipidemia, unspecified: Secondary | ICD-10-CM | POA: Diagnosis not present

## 2023-06-01 DIAGNOSIS — E1169 Type 2 diabetes mellitus with other specified complication: Secondary | ICD-10-CM | POA: Diagnosis not present

## 2023-06-01 DIAGNOSIS — E1122 Type 2 diabetes mellitus with diabetic chronic kidney disease: Secondary | ICD-10-CM | POA: Diagnosis not present

## 2023-06-01 DIAGNOSIS — E1159 Type 2 diabetes mellitus with other circulatory complications: Secondary | ICD-10-CM | POA: Diagnosis not present

## 2023-06-20 ENCOUNTER — Other Ambulatory Visit: Payer: Self-pay

## 2023-06-20 DIAGNOSIS — N400 Enlarged prostate without lower urinary tract symptoms: Secondary | ICD-10-CM

## 2023-06-21 ENCOUNTER — Other Ambulatory Visit: Payer: PPO

## 2023-06-21 ENCOUNTER — Encounter: Payer: Self-pay | Admitting: Urgent Care

## 2023-06-21 DIAGNOSIS — N400 Enlarged prostate without lower urinary tract symptoms: Secondary | ICD-10-CM | POA: Diagnosis not present

## 2023-06-22 LAB — PSA: Prostate Specific Ag, Serum: 3.3 ng/mL (ref 0.0–4.0)

## 2023-06-23 NOTE — Progress Notes (Signed)
06/28/2023 8:40 AM   Joshua Cherry 1950-11-13 [id number]  Referring provider: Marina Goodell, MD [address] [place],  [place] [address]  Urological history: 1. BPH with LU TS -PSA (05/2023) 3.3 -Tamsulosin 0.4 mg daily and finasteride 5 mg daily  Chief Complaint  Patient presents with   Follow-up   HPI: Joshua Cherry is a 72 y.o. male who presents today for yearly visit.   Previous records reviewed.   I PSS 4/2  PVR 20 mL   No urinary complaints.  Patient denies any modifying or aggravating factors.  Patient denies any recent UTI's, gross hematuria, dysuria or suprapubic/flank pain.  Patient denies any fevers, chills, nausea or vomiting.   He is taking tamsulosin and finasteride daily.    IPSS     Row Name 06/28/23 0800         International Prostate Symptom Score   How often have you had the sensation of not emptying your bladder? Less than 1 in 5     How often have you had to urinate less than every two hours? Less than 1 in 5 times     How often have you found you stopped and started again several times when you urinated? Not at All     How often have you found it difficult to postpone urination? Not at All     How often have you had a weak urinary stream? Not at All     How often have you had to strain to start urination? Not at All     How many times did you typically get up at night to urinate? 2 Times     Total IPSS Score 4       Quality of Life due to urinary symptoms   If you were to spend the rest of your life with your urinary condition just the way it is now how would you feel about that? Mostly Satisfied              Score:  1-7 Mild 8-19 Moderate 20-35 Severe   PMH: Past Medical History:  Diagnosis Date   Anemia    Chickenpox    Chronic kidney disease    Diabetes mellitus without complication (HCC)    GERD (gastroesophageal reflux disease)    Heart murmur    followed by PCP   Hypertension    Prostate enlargement     Torn meniscus    right    Surgical History: Past Surgical History:  Procedure Laterality Date   CATARACT EXTRACTION W/PHACO Left 09/08/2016   Procedure: CATARACT EXTRACTION PHACO AND INTRAOCULAR LENS PLACEMENT (IOC);  Surgeon: Lockie Mola, MD;  Location: [facility];  Service: Ophthalmology;  Laterality: Left;  DIABETIC left   CATARACT EXTRACTION W/PHACO Right 09/29/2016   Procedure: CATARACT EXTRACTION PHACO AND INTRAOCULAR LENS PLACEMENT (IOC)  right eye;  Surgeon: Lockie Mola, MD;  Location: [facility];  Service: Ophthalmology;  Laterality: Right;  Diabetic - oral meds Right Eye IVA Topical   COLONOSCOPY     COLONOSCOPY     COLONOSCOPY WITH PROPOFOL N/A 05/12/2020   Procedure: COLONOSCOPY WITH PROPOFOL;  Surgeon: Toledo, Boykin Nearing, MD;  Location: [facility];  Service: Gastroenterology;  Laterality: N/A;   ESOPHAGOGASTRODUODENOSCOPY     ESOPHAGOGASTRODUODENOSCOPY      Home Medications:  Allergies as of 06/28/2023   No Known Allergies      Medication List        Accurate as of  June 28, 2023  8:40 AM. If you have any questions, ask your nurse or doctor.          STOP taking these medications    ferrous sulfate 325 (65 FE) MG EC tablet Stopped by: Carollee Herter Shali Vesey       TAKE these medications    aspirin EC 81 MG tablet Take 81 mg by mouth daily.   B Complex-Vitamin C Caps Take 1 tablet by mouth daily.   carvedilol 3.125 MG tablet Commonly known as: COREG Take 3.125 mg by mouth 2 (two) times daily with a meal.   chlorthalidone 50 MG tablet Commonly known as: HYGROTON Take by mouth.   cyanocobalamin 1000 MCG tablet Commonly known as: VITAMIN B12 Take 1,000 mcg by mouth daily.   empagliflozin 25 MG Tabs tablet Commonly known as: JARDIANCE 0.5 tablets daily.   finasteride 5 MG tablet Commonly known as: PROSCAR Take 1 tablet (5 mg total) by mouth daily.   glipiZIDE 10 MG tablet Commonly known as: GLUCOTROL Take  by mouth. Take 1 tablet (10 mg total) by mouth 2 (two) times daily before meals   lisinopril 40 MG tablet Commonly known as: ZESTRIL Take 40 mg by mouth daily.   lovastatin 40 MG tablet Commonly known as: MEVACOR Take 40 mg by mouth at bedtime.   omeprazole 20 MG capsule Commonly known as: PRILOSEC Take 40 mg by mouth in the morning and at bedtime.   OneTouch Delica Lancets 33G Misc   OneTouch Ultra test strip Generic drug: glucose blood USE ONCE DAILY AS DIRECTED   WHAT MACHINE  NOTHING SINCE 2018   pioglitazone 45 MG tablet Commonly known as: ACTOS Take 45 mg by mouth daily.   sitaGLIPtin 50 MG tablet Commonly known as: JANUVIA   spironolactone 25 MG tablet Commonly known as: ALDACTONE Take 25 mg by mouth daily. Client states he is taking 1/2 tablet daily   tamsulosin 0.4 MG Caps capsule Commonly known as: FLOMAX Take 1 capsule (0.4 mg total) by mouth daily after breakfast.   Vitamin D3 50 MCG (2000 UT) capsule Take 2,000 Units by mouth daily.        Allergies: No Known Allergies  Family History: Family History  Problem Relation Age of Onset   Diabetes Mother    Hypertension Mother    Colon cancer Mother    Hyperlipidemia Mother    CAD Mother    Glaucoma Mother    Heart disease Mother    Heart attack Mother    Stroke Mother    Diabetes Father    Alzheimer's disease Father    Hypertension Father    Hyperlipidemia Father    Hip fracture Father    Diabetes Sister    Glaucoma Sister    Cancer Maternal Grandmother     Social History:  reports that he has never smoked. He has never used smokeless tobacco. He reports that he does not drink alcohol and does not use drugs.  ROS: Pertinent ROS in HPI  Physical Exam: BP 124/61   Pulse 69   Constitutional:  Well nourished. Alert and oriented, No acute distress. HEENT: [place] AT, moist mucus membranes.  Trachea midline Cardiovascular: No clubbing, cyanosis, or edema. Respiratory: Normal respiratory  effort, no increased work of breathing. GU: No CVA tenderness.  No bladder fullness or masses.  Patient with circumcised phallus.   Urethral meatus is patent.  No penile discharge. No penile lesions or rashes. Scrotum without lesions, cysts, rashes and/or edema.  Testicles are located  scrotally bilaterally. No masses are appreciated in the testicles. Left and right epididymis are normal. Rectal: Patient with  normal sphincter tone. Anus and perineum without scarring or rashes. No rectal masses are appreciated. Prostate is approximately 60 + grams, could not palpate the entire glans, no nodules are appreciated in palpated areas.  Seminal vesicles could not be palpated.  Neurologic: Grossly intact, no focal deficits, moving all 4 extremities. Psychiatric: Normal mood and affect.  Laboratory Data: Component     Latest Ref Rng 06/21/2023  Prostate Specific Ag, Serum     0.0 - 4.0 ng/mL 3.3    Hemoglobin A1C Order: [id number] Component Ref Range & Units 3 wk ago  Hemoglobin A1C 4.2 - 5.6 % 8.2 High   Average Blood Glucose (Calc) mg/dL 409  Resulting Agency [facility]  Narrative Performed by [facility] Normal Range:    4.2 - 5.6% Increased Risk:  5.7 - 6.4% Diabetes:        >= 6.5% Glycemic Control for adults with diabetes:  <7%    Specimen Collected: 06/01/23 08:25   Performed by: [facility] Last Resulted: 06/01/23 08:35  Received From: [facility]  Result Received: 06/08/23 08:39   Basic metabolic panel Order: [id number] Component Ref Range & Units 1 mo ago  Sodium 135 - 145 mmol/L 138  Potassium 3.4 - 4.8 mmol/L 5.8 High   Chloride 98 - 107 mmol/L 110 High   CO2 20.0 - 31.0 mmol/L 22.0  Anion Gap 5 - 14 mmol/L 6  BUN 9 - 23 mg/dL 56 High   Creatinine 9.56 - 1.18 mg/dL 2.13 High   BUN/Creatinine Ratio 26  eGFR CKD-EPI (2021) Male >=60 mL/min/1.69m2 31 Low   Comment: eGFR calculated with CKD-EPI 2021  equation in accordance with [employer] and [employer] of Nephrology Task Force recommendations.  Glucose 70 - 179 mg/dL 086 High   Calcium 8.7 - 10.4 mg/dL 9.8  Resulting Agency [facility]   Specimen Collected: 05/11/23 09:16   Performed by: [facility] Last Resulted: 05/11/23 16:09  Received From: [facility]  Result Received: 05/30/23 08:34  I have reviewed the labs.   Pertinent Imaging:  06/28/23 08:29  Scan Result 20 ml    Assessment & Plan:    1. BPH with LU TS -PSA stable -PVR demonstrates adequate emptying -Continue tamsulosin 0.4 mg daily and finasteride 5 mg daily   2.  Fluctuating PSA -According to epic, there is only a 45% of total days covered with the finasteride, so this may be the cause of some of his PSA fluctuations -Will continue to monitor on annual basis  Return in about 1 year (around 06/27/2024) for PSA, I PSS, PVR and DRE .  These notes generated with voice recognition software. I apologize for typographical errors.  Cloretta Ned  [facility] [address]  [address] [place], [place] [address] [phone]

## 2023-06-28 ENCOUNTER — Ambulatory Visit: Payer: PPO | Admitting: Urology

## 2023-06-28 ENCOUNTER — Encounter: Payer: Self-pay | Admitting: Urology

## 2023-06-28 VITALS — BP 124/61 | HR 69

## 2023-06-28 DIAGNOSIS — N400 Enlarged prostate without lower urinary tract symptoms: Secondary | ICD-10-CM

## 2023-06-28 DIAGNOSIS — R972 Elevated prostate specific antigen [PSA]: Secondary | ICD-10-CM

## 2023-06-28 LAB — BLADDER SCAN AMB NON-IMAGING: Scan Result: 20

## 2023-06-28 MED ORDER — FINASTERIDE 5 MG PO TABS: 5.0000 mg | ORAL_TABLET | Freq: Every day | ORAL | 3 refills | Status: DC

## 2023-06-28 MED ORDER — TAMSULOSIN HCL 0.4 MG PO CAPS
0.4000 mg | ORAL_CAPSULE | Freq: Every day | ORAL | 3 refills | Status: DC
Start: 2023-06-28 — End: 2024-06-27

## 2023-06-29 DIAGNOSIS — N1832 Chronic kidney disease, stage 3b: Secondary | ICD-10-CM | POA: Diagnosis not present

## 2023-06-29 DIAGNOSIS — E875 Hyperkalemia: Secondary | ICD-10-CM | POA: Diagnosis not present

## 2023-06-29 DIAGNOSIS — D631 Anemia in chronic kidney disease: Secondary | ICD-10-CM | POA: Diagnosis not present

## 2023-06-29 DIAGNOSIS — I10 Essential (primary) hypertension: Secondary | ICD-10-CM | POA: Diagnosis not present

## 2023-07-08 ENCOUNTER — Inpatient Hospital Stay: Payer: PPO | Attending: Oncology

## 2023-07-08 DIAGNOSIS — M7989 Other specified soft tissue disorders: Secondary | ICD-10-CM | POA: Insufficient documentation

## 2023-07-08 DIAGNOSIS — D631 Anemia in chronic kidney disease: Secondary | ICD-10-CM | POA: Insufficient documentation

## 2023-07-08 DIAGNOSIS — N183 Chronic kidney disease, stage 3 unspecified: Secondary | ICD-10-CM

## 2023-07-08 DIAGNOSIS — N184 Chronic kidney disease, stage 4 (severe): Secondary | ICD-10-CM | POA: Diagnosis not present

## 2023-07-08 LAB — CBC WITH DIFFERENTIAL (CANCER CENTER ONLY)
Abs Immature Granulocytes: 0.05 K/uL (ref 0.00–0.07)
Basophils Absolute: 0 K/uL (ref 0.0–0.1)
Basophils Relative: 1 %
Eosinophils Absolute: 0.3 K/uL (ref 0.0–0.5)
Eosinophils Relative: 7 %
HCT: 29.3 % — ABNORMAL LOW (ref 39.0–52.0)
Hemoglobin: 9.3 g/dL — ABNORMAL LOW (ref 13.0–17.0)
Immature Granulocytes: 1 %
Lymphocytes Relative: 20 %
Lymphs Abs: 0.9 K/uL (ref 0.7–4.0)
MCH: 31.8 pg (ref 26.0–34.0)
MCHC: 31.7 g/dL (ref 30.0–36.0)
MCV: 100.3 fL — ABNORMAL HIGH (ref 80.0–100.0)
Monocytes Absolute: 0.7 K/uL (ref 0.1–1.0)
Monocytes Relative: 15 %
Neutro Abs: 2.7 K/uL (ref 1.7–7.7)
Neutrophils Relative %: 56 %
Platelet Count: 197 K/uL (ref 150–400)
RBC: 2.92 MIL/uL — ABNORMAL LOW (ref 4.22–5.81)
RDW: 13.5 % (ref 11.5–15.5)
WBC Count: 4.8 K/uL (ref 4.0–10.5)
nRBC: 0 % (ref 0.0–0.2)

## 2023-07-08 LAB — RETIC PANEL
Immature Retic Fract: 15.8 % (ref 2.3–15.9)
RBC.: 2.97 MIL/uL — ABNORMAL LOW (ref 4.22–5.81)
Retic Count, Absolute: 43.4 K/uL (ref 19.0–186.0)
Retic Ct Pct: 1.5 % (ref 0.4–3.1)
Reticulocyte Hemoglobin: 34.1 pg

## 2023-07-08 LAB — IRON AND TIBC
Iron: 65 ug/dL (ref 45–182)
Saturation Ratios: 20 % (ref 17.9–39.5)
TIBC: 321 ug/dL (ref 250–450)
UIBC: 256 ug/dL

## 2023-07-08 LAB — FERRITIN: Ferritin: 233 ng/mL (ref 24–336)

## 2023-07-11 ENCOUNTER — Inpatient Hospital Stay: Payer: PPO

## 2023-07-11 ENCOUNTER — Inpatient Hospital Stay: Payer: PPO | Admitting: Oncology

## 2023-07-11 ENCOUNTER — Encounter: Payer: Self-pay | Admitting: Oncology

## 2023-07-11 VITALS — BP 135/59 | HR 57 | Temp 97.1°F | Resp 18 | Wt 178.0 lb

## 2023-07-11 DIAGNOSIS — D631 Anemia in chronic kidney disease: Secondary | ICD-10-CM

## 2023-07-11 DIAGNOSIS — N184 Chronic kidney disease, stage 4 (severe): Secondary | ICD-10-CM | POA: Diagnosis not present

## 2023-07-11 DIAGNOSIS — D509 Iron deficiency anemia, unspecified: Secondary | ICD-10-CM

## 2023-07-11 DIAGNOSIS — N183 Chronic kidney disease, stage 3 unspecified: Secondary | ICD-10-CM | POA: Diagnosis not present

## 2023-07-11 MED ORDER — EPOETIN ALFA-EPBX 20000 UNIT/ML IJ SOLN
20000.0000 [IU] | Freq: Once | INTRAMUSCULAR | Status: AC
Start: 2023-07-11 — End: 2023-07-11
  Administered 2023-07-11: 20000 [IU] via SUBCUTANEOUS
  Filled 2023-07-11: qty 1

## 2023-07-11 NOTE — Progress Notes (Signed)
Hematology/Oncology Progress note [phone] [phone]     ASSESSMENT & PLAN:   Anemia of chronic kidney failure, stage 3 (moderate) (HCC) # Anemia of chronic renal disease Labs reviewed and discussed with patient. Lab Results  Component Value Date   HGB 9.3 (L) 07/08/2023   TIBC 321 07/08/2023   IRONPCTSAT 20 07/08/2023   FERRITIN 233 07/08/2023    Hold Venofer as ferritin is at goal.  No need for erythropoietin therapy continue ferrous sulfate 325 mg 2 times per week for maintenance.    Orders Placed This Encounter  Procedures   Hemoglobin and hematocrit, blood    Standing Status:   Future    Standing Expiration Date:   07/10/2024   Hemoglobin and hematocrit, blood    Standing Status:   Future    Standing Expiration Date:   07/10/2024   Hemoglobin and hematocrit, blood    Standing Status:   Future    Standing Expiration Date:   07/10/2024   CBC with Differential (Cancer Center Only)    Standing Status:   Future    Standing Expiration Date:   07/10/2024   Iron and TIBC    Standing Status:   Future    Standing Expiration Date:   07/10/2024   Ferritin    Standing Status:   Future    Standing Expiration Date:   07/10/2024   Follow up  5 weeks, 10 week, 15 weeks H&H +/- retacrit 20 weeks labs prior to MD +/- Venofer +/- retacrit - Cbc iron tibc ferritin   All questions were answered. The patient knows to call the clinic with any problems, questions or concerns.  Rickard Patience, MD, PhD [facility] Hematology Oncology 07/11/2023   Chief Complaint: Joshua Cherry is a 72 y.o. male presents for anemia due to  stage III chronic kidney disease.  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    Work-up in 10/2014 revealed a hematocrit of 31.8, hemoglobin 10.3, MCV 95, with a normal WBC and platelet count.   B12 and folate were normal.  SPEP was normal on 07/09/2014.  Iron studies were normal with a saturation of 18% and a TIBC of 297. Ferritin was 72.  Erythropoietin was 10.6.  Antinuclear antibody direct was positive with an anti-DNA double-stranded 24 (0-9).  Sedimentation rate was 3.  Haptoglobin was 181. Coombs was negative.    Work-up on 08/10/2017 revealed a ferritin of 116.  Iron saturation was 14% with a TIBC of 372.  BUN was 59 with a Cr 1.98 (CrCl 33-38 ml/min).  Epo level was 8.8.  SPEP revealed no monoclonal protein.  Retic was 1%.  Urinalysis revealed no hematuria.  Ferritin was 81 on 11/15/2019.  Labs on 11/19/2019 included an iron saturation of 17% with a TIBC of 347. TSH and B12 were normal. Retic was 1.3%.  He receives Retacrit every 2 weeks (12/13/2019 - 12/27/2019) then monthly   # EGD and colonoscopy noted only reflux in 2015.  Guaiac cards were negative.  Colonoscopy on 05/12/2020 revealed non-bleeding internal hemorrhoids.  He has never had a capsule study.     # 2015  B12 deficiency .  Patient has chronic bilateral lower extremity leg swelling.    INTERVAL HISTORY Joshua Cherry is a 72 y.o. male who has above history reviewed by me today presents for follow up visit for  management of anemia of CKD Fatigue level is stable, no new complaints.  Denies any blood in his stool, stomach pain, lightheaded   Review of Systems  Constitutional:  Negative for appetite change, chills, fatigue, fever and unexpected weight change.  HENT:   Negative for hearing loss and voice change.   Eyes:  Negative for eye problems and icterus.  Respiratory:  Negative for chest tightness, cough and shortness of breath.   Cardiovascular:  Negative for chest pain and leg swelling.  Gastrointestinal:  Negative for abdominal distention and abdominal pain.  Endocrine: Negative for hot flashes.  Genitourinary:  Negative for difficulty urinating, dysuria and frequency.   Musculoskeletal:  Negative for  arthralgias.  Skin:  Negative for itching and rash.  Neurological:  Negative for light-headedness and numbness.  Hematological:  Negative for adenopathy. Does not bruise/bleed easily.  Psychiatric/Behavioral:  Negative for confusion.        Past Medical History:  Diagnosis Date   Anemia    Chickenpox    Chronic kidney disease    Diabetes mellitus without complication (HCC)    GERD (gastroesophageal reflux disease)    Heart murmur    followed by PCP   Hypertension    Prostate enlargement    Torn meniscus    right    Past Surgical History:  Procedure Laterality Date   CATARACT EXTRACTION W/PHACO Left 09/08/2016   Procedure: CATARACT EXTRACTION PHACO AND INTRAOCULAR LENS PLACEMENT (IOC);  Surgeon: Lockie Mola, MD;  Location: [facility];  Service: Ophthalmology;  Laterality: Left;  DIABETIC left   CATARACT EXTRACTION W/PHACO Right 09/29/2016   Procedure: CATARACT EXTRACTION PHACO AND INTRAOCULAR LENS PLACEMENT (IOC)  right eye;  Surgeon: Lockie Mola, MD;  Location: [facility];  Service: Ophthalmology;  Laterality: Right;  Diabetic - oral meds Right Eye IVA Topical   COLONOSCOPY     COLONOSCOPY     COLONOSCOPY WITH PROPOFOL N/A 05/12/2020   Procedure: COLONOSCOPY WITH PROPOFOL;  Surgeon: Toledo, Boykin Nearing, MD;  Location: [facility];  Service: Gastroenterology;  Laterality: N/A;   ESOPHAGOGASTRODUODENOSCOPY     ESOPHAGOGASTRODUODENOSCOPY      Family History  Problem Relation Age of Onset   Diabetes Mother    Hypertension Mother    Colon cancer Mother    Hyperlipidemia Mother    CAD Mother    Glaucoma Mother    Heart disease Mother    Heart attack Mother    Stroke Mother    Diabetes Father    Alzheimer's disease Father    Hypertension Father    Hyperlipidemia Father    Hip fracture Father    Diabetes Sister    Glaucoma Sister    Cancer Maternal Grandmother     Social History:  reports that he has never smoked. He has never  used smokeless tobacco. He reports that he does not drink alcohol and does not use drugs.  Allergies: No Known Allergies  Current Medications: Current Outpatient Medications  Medication Sig Dispense Refill   aspirin EC 81 MG tablet Take 81 mg by mouth daily.      B Complex-C (B COMPLEX-VITAMIN C) CAPS Take 1 tablet by mouth daily.      carvedilol (COREG) 3.125 MG tablet Take 3.125 mg by mouth 2 (two) times daily with a meal.      chlorthalidone (HYGROTON) 50 MG tablet Take by mouth.     Cholecalciferol (VITAMIN D3) 2000 UNITS capsule Take 2,000 Units by mouth daily.      empagliflozin (JARDIANCE)  25 MG TABS tablet 0.5 tablets daily.     finasteride (PROSCAR) 5 MG tablet Take 1 tablet (5 mg total) by mouth daily. 90 tablet 3   glipiZIDE (GLUCOTROL) 10 MG tablet Take by mouth. Take 1 tablet (10 mg total) by mouth 2 (two) times daily before meals     lisinopril (PRINIVIL,ZESTRIL) 40 MG tablet Take 40 mg by mouth daily.     lovastatin (MEVACOR) 40 MG tablet Take 40 mg by mouth at bedtime.     omeprazole (PRILOSEC) 20 MG capsule Take 40 mg by mouth in the morning and at bedtime.      ONETOUCH DELICA LANCETS 33G MISC      ONETOUCH ULTRA test strip USE ONCE DAILY AS DIRECTED   WHAT MACHINE  NOTHING SINCE 2018     pioglitazone (ACTOS) 45 MG tablet Take 45 mg by mouth daily.      sitaGLIPtin (JANUVIA) 50 MG tablet      spironolactone (ALDACTONE) 25 MG tablet Take 25 mg by mouth daily. Client states he is taking 1/2 tablet daily     tamsulosin (FLOMAX) 0.4 MG CAPS capsule Take 1 capsule (0.4 mg total) by mouth daily after breakfast. 90 capsule 3   vitamin B-12 (CYANOCOBALAMIN) 1000 MCG tablet Take 1,000 mcg by mouth daily.     No current facility-administered medications for this visit.     Vitals:  Blood pressure (!) 135/59, pulse (!) 57, temperature (!) 97.1 F (36.2 C), resp. rate 18, weight 178 lb (80.7 kg).  Physical Exam Constitutional:      General: He is not in acute distress.     Appearance: He is not diaphoretic.     Comments: Ambulates independently  HENT:     Head: Normocephalic and atraumatic.     Nose: Nose normal.     Mouth/Throat:     Pharynx: No oropharyngeal exudate.  Eyes:     General: No scleral icterus.    Pupils: Pupils are equal, round, and reactive to light.  Cardiovascular:     Rate and Rhythm: Normal rate and regular rhythm.     Heart sounds: Murmur heard.  Pulmonary:     Effort: Pulmonary effort is normal. No respiratory distress.     Breath sounds: No rales.  Chest:     Chest wall: No tenderness.  Abdominal:     General: There is no distension.     Palpations: Abdomen is soft.     Tenderness: There is no abdominal tenderness.  Musculoskeletal:        General: Normal range of motion.     Cervical back: Normal range of motion and neck supple.     Comments:  trace edema lower extremity bilaterally  Skin:    General: Skin is warm and dry.     Findings: No erythema.  Neurological:     Mental Status: He is alert and oriented to person, place, and time.     Cranial Nerves: No cranial nerve deficit.     Motor: No abnormal muscle tone.     Coordination: Coordination normal.  Psychiatric:        Mood and Affect: Affect normal.

## 2023-07-11 NOTE — Assessment & Plan Note (Addendum)
#   Anemia of chronic renal disease Labs reviewed and discussed with patient. Lab Results  Component Value Date   HGB 9.3 (L) 07/08/2023   TIBC 321 07/08/2023   IRONPCTSAT 20 07/08/2023   FERRITIN 233 07/08/2023    Hold Venofer as ferritin is at goal.  Retacrit today.  continue ferrous sulfate 325 mg 2 times per week for maintenance.

## 2023-08-05 DIAGNOSIS — E119 Type 2 diabetes mellitus without complications: Secondary | ICD-10-CM | POA: Diagnosis not present

## 2023-08-05 DIAGNOSIS — M898X9 Other specified disorders of bone, unspecified site: Secondary | ICD-10-CM | POA: Diagnosis not present

## 2023-08-05 DIAGNOSIS — L600 Ingrowing nail: Secondary | ICD-10-CM | POA: Diagnosis not present

## 2023-08-05 DIAGNOSIS — B351 Tinea unguium: Secondary | ICD-10-CM | POA: Diagnosis not present

## 2023-08-15 ENCOUNTER — Inpatient Hospital Stay: Payer: PPO | Attending: Oncology

## 2023-08-15 ENCOUNTER — Ambulatory Visit: Payer: PPO

## 2023-08-15 ENCOUNTER — Other Ambulatory Visit: Payer: PPO

## 2023-08-15 ENCOUNTER — Inpatient Hospital Stay: Payer: PPO

## 2023-08-15 DIAGNOSIS — N184 Chronic kidney disease, stage 4 (severe): Secondary | ICD-10-CM | POA: Insufficient documentation

## 2023-08-15 DIAGNOSIS — D631 Anemia in chronic kidney disease: Secondary | ICD-10-CM | POA: Diagnosis not present

## 2023-08-15 LAB — HEMOGLOBIN AND HEMATOCRIT, BLOOD
HCT: 33.5 % — ABNORMAL LOW (ref 39.0–52.0)
Hemoglobin: 10.6 g/dL — ABNORMAL LOW (ref 13.0–17.0)

## 2023-08-15 NOTE — Progress Notes (Signed)
No retacrit. Hbg is 10.6

## 2023-09-19 ENCOUNTER — Inpatient Hospital Stay: Payer: PPO | Attending: Oncology

## 2023-09-19 ENCOUNTER — Inpatient Hospital Stay: Payer: PPO

## 2023-09-19 ENCOUNTER — Other Ambulatory Visit: Payer: PPO

## 2023-09-19 ENCOUNTER — Ambulatory Visit: Payer: PPO

## 2023-09-19 VITALS — BP 150/61

## 2023-09-19 DIAGNOSIS — D509 Iron deficiency anemia, unspecified: Secondary | ICD-10-CM

## 2023-09-19 DIAGNOSIS — N184 Chronic kidney disease, stage 4 (severe): Secondary | ICD-10-CM | POA: Insufficient documentation

## 2023-09-19 DIAGNOSIS — D631 Anemia in chronic kidney disease: Secondary | ICD-10-CM | POA: Diagnosis not present

## 2023-09-19 LAB — HEMOGLOBIN AND HEMATOCRIT, BLOOD
HCT: 31.1 % — ABNORMAL LOW (ref 39.0–52.0)
Hemoglobin: 9.9 g/dL — ABNORMAL LOW (ref 13.0–17.0)

## 2023-09-19 MED ORDER — EPOETIN ALFA-EPBX 20000 UNIT/ML IJ SOLN
20000.0000 [IU] | Freq: Once | INTRAMUSCULAR | Status: AC
Start: 2023-09-19 — End: 2023-09-19
  Administered 2023-09-19: 20000 [IU] via SUBCUTANEOUS
  Filled 2023-09-19: qty 1

## 2023-09-30 DIAGNOSIS — E538 Deficiency of other specified B group vitamins: Secondary | ICD-10-CM | POA: Diagnosis not present

## 2023-09-30 DIAGNOSIS — K219 Gastro-esophageal reflux disease without esophagitis: Secondary | ICD-10-CM | POA: Diagnosis not present

## 2023-09-30 DIAGNOSIS — E1121 Type 2 diabetes mellitus with diabetic nephropathy: Secondary | ICD-10-CM | POA: Diagnosis not present

## 2023-09-30 DIAGNOSIS — D631 Anemia in chronic kidney disease: Secondary | ICD-10-CM | POA: Diagnosis not present

## 2023-09-30 DIAGNOSIS — N401 Enlarged prostate with lower urinary tract symptoms: Secondary | ICD-10-CM | POA: Diagnosis not present

## 2023-09-30 DIAGNOSIS — I10 Essential (primary) hypertension: Secondary | ICD-10-CM | POA: Diagnosis not present

## 2023-09-30 DIAGNOSIS — N1832 Chronic kidney disease, stage 3b: Secondary | ICD-10-CM | POA: Diagnosis not present

## 2023-09-30 DIAGNOSIS — E785 Hyperlipidemia, unspecified: Secondary | ICD-10-CM | POA: Diagnosis not present

## 2023-10-12 DIAGNOSIS — E785 Hyperlipidemia, unspecified: Secondary | ICD-10-CM | POA: Diagnosis not present

## 2023-10-12 DIAGNOSIS — I152 Hypertension secondary to endocrine disorders: Secondary | ICD-10-CM | POA: Diagnosis not present

## 2023-10-12 DIAGNOSIS — N183 Chronic kidney disease, stage 3 unspecified: Secondary | ICD-10-CM | POA: Diagnosis not present

## 2023-10-12 DIAGNOSIS — E1159 Type 2 diabetes mellitus with other circulatory complications: Secondary | ICD-10-CM | POA: Diagnosis not present

## 2023-10-12 DIAGNOSIS — E1122 Type 2 diabetes mellitus with diabetic chronic kidney disease: Secondary | ICD-10-CM | POA: Diagnosis not present

## 2023-10-12 DIAGNOSIS — E1169 Type 2 diabetes mellitus with other specified complication: Secondary | ICD-10-CM | POA: Diagnosis not present

## 2023-10-24 ENCOUNTER — Inpatient Hospital Stay: Payer: PPO | Attending: Oncology

## 2023-10-24 ENCOUNTER — Other Ambulatory Visit: Payer: PPO

## 2023-10-24 ENCOUNTER — Inpatient Hospital Stay: Payer: PPO

## 2023-10-24 ENCOUNTER — Ambulatory Visit: Payer: PPO

## 2023-10-24 DIAGNOSIS — N183 Chronic kidney disease, stage 3 unspecified: Secondary | ICD-10-CM

## 2023-10-24 DIAGNOSIS — D631 Anemia in chronic kidney disease: Secondary | ICD-10-CM | POA: Diagnosis not present

## 2023-10-24 DIAGNOSIS — E538 Deficiency of other specified B group vitamins: Secondary | ICD-10-CM | POA: Diagnosis not present

## 2023-10-24 DIAGNOSIS — N184 Chronic kidney disease, stage 4 (severe): Secondary | ICD-10-CM | POA: Diagnosis not present

## 2023-10-24 LAB — HEMOGLOBIN AND HEMATOCRIT, BLOOD
HCT: 33.2 % — ABNORMAL LOW (ref 39.0–52.0)
Hemoglobin: 10.7 g/dL — ABNORMAL LOW (ref 13.0–17.0)

## 2023-10-24 NOTE — Progress Notes (Signed)
 Hgb is 10.7; hold retacrit today

## 2023-11-03 DIAGNOSIS — D3132 Benign neoplasm of left choroid: Secondary | ICD-10-CM | POA: Diagnosis not present

## 2023-11-03 DIAGNOSIS — E113293 Type 2 diabetes mellitus with mild nonproliferative diabetic retinopathy without macular edema, bilateral: Secondary | ICD-10-CM | POA: Diagnosis not present

## 2023-11-03 DIAGNOSIS — Z961 Presence of intraocular lens: Secondary | ICD-10-CM | POA: Diagnosis not present

## 2023-11-03 DIAGNOSIS — H43813 Vitreous degeneration, bilateral: Secondary | ICD-10-CM | POA: Diagnosis not present

## 2023-11-22 DIAGNOSIS — D631 Anemia in chronic kidney disease: Secondary | ICD-10-CM | POA: Diagnosis not present

## 2023-11-22 DIAGNOSIS — E875 Hyperkalemia: Secondary | ICD-10-CM | POA: Diagnosis not present

## 2023-11-22 DIAGNOSIS — I10 Essential (primary) hypertension: Secondary | ICD-10-CM | POA: Diagnosis not present

## 2023-11-22 DIAGNOSIS — N1832 Chronic kidney disease, stage 3b: Secondary | ICD-10-CM | POA: Diagnosis not present

## 2023-11-25 ENCOUNTER — Inpatient Hospital Stay: Payer: PPO | Attending: Oncology

## 2023-11-25 DIAGNOSIS — D631 Anemia in chronic kidney disease: Secondary | ICD-10-CM | POA: Insufficient documentation

## 2023-11-25 DIAGNOSIS — Z800 Family history of malignant neoplasm of digestive organs: Secondary | ICD-10-CM | POA: Insufficient documentation

## 2023-11-25 DIAGNOSIS — N184 Chronic kidney disease, stage 4 (severe): Secondary | ICD-10-CM | POA: Diagnosis not present

## 2023-11-25 DIAGNOSIS — N183 Chronic kidney disease, stage 3 unspecified: Secondary | ICD-10-CM

## 2023-11-25 LAB — CBC WITH DIFFERENTIAL (CANCER CENTER ONLY)
Abs Immature Granulocytes: 0.04 10*3/uL (ref 0.00–0.07)
Basophils Absolute: 0 10*3/uL (ref 0.0–0.1)
Basophils Relative: 1 %
Eosinophils Absolute: 0.3 10*3/uL (ref 0.0–0.5)
Eosinophils Relative: 6 %
HCT: 32.7 % — ABNORMAL LOW (ref 39.0–52.0)
Hemoglobin: 10.4 g/dL — ABNORMAL LOW (ref 13.0–17.0)
Immature Granulocytes: 1 %
Lymphocytes Relative: 17 %
Lymphs Abs: 0.9 10*3/uL (ref 0.7–4.0)
MCH: 31.8 pg (ref 26.0–34.0)
MCHC: 31.8 g/dL (ref 30.0–36.0)
MCV: 100 fL (ref 80.0–100.0)
Monocytes Absolute: 0.7 10*3/uL (ref 0.1–1.0)
Monocytes Relative: 13 %
Neutro Abs: 3.3 10*3/uL (ref 1.7–7.7)
Neutrophils Relative %: 62 %
Platelet Count: 205 10*3/uL (ref 150–400)
RBC: 3.27 MIL/uL — ABNORMAL LOW (ref 4.22–5.81)
RDW: 13.7 % (ref 11.5–15.5)
WBC Count: 5.3 10*3/uL (ref 4.0–10.5)
nRBC: 0 % (ref 0.0–0.2)

## 2023-11-25 LAB — IRON AND TIBC
Iron: 61 ug/dL (ref 45–182)
Saturation Ratios: 19 % (ref 17.9–39.5)
TIBC: 322 ug/dL (ref 250–450)
UIBC: 261 ug/dL

## 2023-11-25 LAB — FERRITIN: Ferritin: 249 ng/mL (ref 24–336)

## 2023-11-28 ENCOUNTER — Encounter: Payer: Self-pay | Admitting: Oncology

## 2023-11-28 ENCOUNTER — Inpatient Hospital Stay: Payer: PPO | Admitting: Oncology

## 2023-11-28 ENCOUNTER — Inpatient Hospital Stay: Payer: PPO

## 2023-11-28 VITALS — BP 121/56 | HR 64 | Temp 96.6°F | Resp 18 | Wt 176.7 lb

## 2023-11-28 DIAGNOSIS — N183 Chronic kidney disease, stage 3 unspecified: Secondary | ICD-10-CM

## 2023-11-28 DIAGNOSIS — D631 Anemia in chronic kidney disease: Secondary | ICD-10-CM

## 2023-11-28 DIAGNOSIS — N184 Chronic kidney disease, stage 4 (severe): Secondary | ICD-10-CM | POA: Diagnosis not present

## 2023-11-28 NOTE — Assessment & Plan Note (Addendum)
#   Anemia of chronic renal disease Labs reviewed and discussed with patient. Lab Results  Component Value Date   HGB 10.4 (L) 11/25/2023   TIBC 322 11/25/2023   IRONPCTSAT 19 11/25/2023   FERRITIN 249 11/25/2023    Hold Venofer as ferritin is at goal.  No need for retacrit.  continue ferrous sulfate 325 mg 2 times per week for maintenance.

## 2023-11-28 NOTE — Progress Notes (Signed)
 Hematology/Oncology Progress note Telephone:(336) [phone] Fax:(336) [phone]     ASSESSMENT & PLAN:   Anemia of chronic kidney failure, stage 3 (moderate) (HCC) # Anemia of chronic renal disease Labs reviewed and discussed with patient. Lab Results  Component Value Date   HGB 10.4 (L) 11/25/2023   TIBC 322 11/25/2023   IRONPCTSAT 19 11/25/2023   FERRITIN 249 11/25/2023    Hold Venofer as ferritin is at goal.  No need for retacrit.  continue ferrous sulfate 325 mg 2 times per week for maintenance.    Orders Placed This Encounter  Procedures   CBC with Differential (Cancer Center Only)    Standing Status:   Future    Expected Date:   05/14/2024    Expiration Date:   11/27/2024   Iron and TIBC    Standing Status:   Future    Expected Date:   05/14/2024    Expiration Date:   11/27/2024   Ferritin    Standing Status:   Future    Expected Date:   05/14/2024    Expiration Date:   11/27/2024   Retic Panel    Standing Status:   Future    Expected Date:   05/14/2024    Expiration Date:   11/27/2024   Hemoglobin and Hematocrit (Cancer Center Only)    Standing Status:   Standing    Number of Occurrences:   3    Expiration Date:   11/27/2024   Follow up  6 weeks, 12week, 18 weeks H&H +/- retacrit 24 weeks labs prior to MD +/- Venofer +/- retacrit - Cbc iron tibc ferritin   All questions were answered. The patient knows to call the clinic with any problems, questions or concerns.  Rickard Patience, MD, PhD [facility] Hematology Oncology 11/28/2023   Chief Complaint: Joshua Cherry is a 73 y.o. male presents for anemia due to  stage III chronic kidney disease.  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    Work-up in 10/2014 revealed a hematocrit of 31.8, hemoglobin 10.3, MCV 95, with a normal WBC and platelet count.  B12 and folate were  normal.  SPEP was normal on 07/09/2014.  Iron studies were normal with a saturation of 18% and a TIBC of 297. Ferritin was 72.  Erythropoietin was 10.6.  Antinuclear antibody direct was positive with an anti-DNA double-stranded 24 (0-9).  Sedimentation rate was 3.  Haptoglobin was 181. Coombs was negative.    Work-up on 08/10/2017 revealed a ferritin of 116.  Iron saturation was 14% with a TIBC of 372.  BUN was 59 with a Cr 1.98 (CrCl 33-38 ml/min).  Epo level was 8.8.  SPEP revealed no monoclonal protein.  Retic was 1%.  Urinalysis revealed no hematuria.  Ferritin was 81 on 11/15/2019.  Labs on 11/19/2019 included an iron saturation of 17% with a TIBC of 347. TSH and B12 were normal. Retic was 1.3%.  He receives Retacrit every 2 weeks (12/13/2019 - 12/27/2019) then monthly   # EGD and colonoscopy noted only reflux in 2015.  Guaiac cards were negative.  Colonoscopy on 05/12/2020 revealed non-bleeding internal hemorrhoids.  He has never had a capsule study.     # 2015  B12 deficiency .  Patient has chronic bilateral lower extremity leg swelling.    INTERVAL HISTORY Joshua Cherry is a 73 y.o. male who has  above history reviewed by me today presents for follow up visit for management of anemia of CKD Fatigue level is stable, no new complaints.  Denies any blood in his stool, stomach pain, lightheaded   Review of Systems  Constitutional:  Negative for appetite change, chills, fatigue, fever and unexpected weight change.  HENT:   Negative for hearing loss and voice change.   Eyes:  Negative for eye problems and icterus.  Respiratory:  Negative for chest tightness, cough and shortness of breath.   Cardiovascular:  Negative for chest pain and leg swelling.  Gastrointestinal:  Negative for abdominal distention and abdominal pain.  Endocrine: Negative for hot flashes.  Genitourinary:  Negative for difficulty urinating, dysuria and frequency.   Musculoskeletal:  Negative for arthralgias.  Skin:   Negative for itching and rash.  Neurological:  Negative for light-headedness and numbness.  Hematological:  Negative for adenopathy. Does not bruise/bleed easily.  Psychiatric/Behavioral:  Negative for confusion.        Past Medical History:  Diagnosis Date   Anemia    Chickenpox    Chronic kidney disease    Diabetes mellitus without complication (HCC)    GERD (gastroesophageal reflux disease)    Heart murmur    followed by PCP   Hypertension    Prostate enlargement    Torn meniscus    right    Past Surgical History:  Procedure Laterality Date   CATARACT EXTRACTION W/PHACO Left 09/08/2016   Procedure: CATARACT EXTRACTION PHACO AND INTRAOCULAR LENS PLACEMENT (IOC);  Surgeon: Lockie Mola, MD;  Location: [facility];  Service: Ophthalmology;  Laterality: Left;  DIABETIC left   CATARACT EXTRACTION W/PHACO Right 09/29/2016   Procedure: CATARACT EXTRACTION PHACO AND INTRAOCULAR LENS PLACEMENT (IOC)  right eye;  Surgeon: Lockie Mola, MD;  Location: [facility];  Service: Ophthalmology;  Laterality: Right;  Diabetic - oral meds Right Eye IVA Topical   COLONOSCOPY     COLONOSCOPY     COLONOSCOPY WITH PROPOFOL N/A 05/12/2020   Procedure: COLONOSCOPY WITH PROPOFOL;  Surgeon: Toledo, Boykin Nearing, MD;  Location: [facility] END[facility];  Service: Gastroenterology;  Laterality: N/A;   ESOPHAGOGASTRODUODENOSCOPY     ESOPHAGOGASTRODUODENOSCOPY      Family History  Problem Relation Age of Onset   Diabetes Mother    Hypertension Mother    Colon cancer Mother    Hyperlipidemia Mother    CAD Mother    Glaucoma Mother    Heart disease Mother    Heart attack Mother    Stroke Mother    Diabetes Father    Alzheimer's disease Father    Hypertension Father    Hyperlipidemia Father    Hip fracture Father    Diabetes Sister    Glaucoma Sister    Cancer Maternal Grandmother     Social History:  reports that he has never smoked. He has never used smokeless tobacco.  He reports that he does not drink alcohol and does not use drugs.  Allergies: No Known Allergies  Current Medications: Current Outpatient Medications  Medication Sig Dispense Refill   aspirin EC 81 MG tablet Take 81 mg by mouth daily.      B Complex-C (B COMPLEX-VITAMIN C) CAPS Take 1 tablet by mouth daily.      carvedilol (COREG) 3.125 MG tablet Take 3.125 mg by mouth 2 (two) times daily with a meal.      chlorthalidone (HYGROTON) 50 MG tablet Take by mouth.     Cholecalciferol (VITAMIN D3) 2000 UNITS capsule Take  2,000 Units by mouth daily.      empagliflozin (JARDIANCE) 25 MG TABS tablet 0.5 tablets daily.     ferrous sulfate 325 (65 FE) MG EC tablet Take by mouth.     finasteride (PROSCAR) 5 MG tablet Take 1 tablet (5 mg total) by mouth daily. 90 tablet 3   glipiZIDE (GLUCOTROL) 10 MG tablet Take by mouth. Take 1 tablet (10 mg total) by mouth 2 (two) times daily before meals     lisinopril (PRINIVIL,ZESTRIL) 40 MG tablet Take 40 mg by mouth daily.     lovastatin (MEVACOR) 40 MG tablet Take 40 mg by mouth at bedtime.     omeprazole (PRILOSEC) 20 MG capsule Take 40 mg by mouth in the morning and at bedtime.      ONETOUCH DELICA LANCETS 33G MISC      ONETOUCH ULTRA test strip USE ONCE DAILY AS DIRECTED   WHAT MACHINE  NOTHING SINCE 2018     pioglitazone (ACTOS) 45 MG tablet Take 45 mg by mouth daily.      sitaGLIPtin (JANUVIA) 50 MG tablet      spironolactone (ALDACTONE) 25 MG tablet Take 25 mg by mouth daily. Client states he is taking 1/2 tablet daily     tamsulosin (FLOMAX) 0.4 MG CAPS capsule Take 1 capsule (0.4 mg total) by mouth daily after breakfast. 90 capsule 3   vitamin B-12 (CYANOCOBALAMIN) 1000 MCG tablet Take 1,000 mcg by mouth daily.     No current facility-administered medications for this visit.     Vitals:  Blood pressure (!) 121/56, pulse 64, temperature (!) 96.6 F (35.9 C), temperature source Tympanic, resp. rate 18, weight 176 lb 11.2 oz (80.2 kg), SpO2  98%.  Physical Exam Constitutional:      General: He is not in acute distress.    Appearance: He is not diaphoretic.     Comments: Ambulates independently  HENT:     Head: Normocephalic and atraumatic.     Nose: Nose normal.     Mouth/Throat:     Pharynx: No oropharyngeal exudate.  Eyes:     General: No scleral icterus.    Pupils: Pupils are equal, round, and reactive to light.  Cardiovascular:     Rate and Rhythm: Normal rate and regular rhythm.     Heart sounds: Murmur heard.  Pulmonary:     Effort: Pulmonary effort is normal. No respiratory distress.     Breath sounds: No rales.  Chest:     Chest wall: No tenderness.  Abdominal:     General: There is no distension.     Palpations: Abdomen is soft.     Tenderness: There is no abdominal tenderness.  Musculoskeletal:        General: Normal range of motion.     Cervical back: Normal range of motion and neck supple.     Comments:  trace edema lower extremity bilaterally  Skin:    General: Skin is warm and dry.     Findings: No erythema.  Neurological:     Mental Status: He is alert and oriented to person, place, and time.     Cranial Nerves: No cranial nerve deficit.     Motor: No abnormal muscle tone.     Coordination: Coordination normal.  Psychiatric:        Mood and Affect: Affect normal.

## 2023-12-05 DIAGNOSIS — N1832 Chronic kidney disease, stage 3b: Secondary | ICD-10-CM | POA: Diagnosis not present

## 2023-12-05 DIAGNOSIS — D631 Anemia in chronic kidney disease: Secondary | ICD-10-CM | POA: Diagnosis not present

## 2023-12-05 DIAGNOSIS — I10 Essential (primary) hypertension: Secondary | ICD-10-CM | POA: Diagnosis not present

## 2023-12-05 DIAGNOSIS — E875 Hyperkalemia: Secondary | ICD-10-CM | POA: Diagnosis not present

## 2024-01-09 ENCOUNTER — Other Ambulatory Visit

## 2024-01-09 ENCOUNTER — Inpatient Hospital Stay: Attending: Oncology

## 2024-01-09 ENCOUNTER — Inpatient Hospital Stay

## 2024-01-09 ENCOUNTER — Ambulatory Visit

## 2024-01-09 VITALS — BP 136/58

## 2024-01-09 DIAGNOSIS — D631 Anemia in chronic kidney disease: Secondary | ICD-10-CM | POA: Diagnosis not present

## 2024-01-09 DIAGNOSIS — D509 Iron deficiency anemia, unspecified: Secondary | ICD-10-CM

## 2024-01-09 DIAGNOSIS — N184 Chronic kidney disease, stage 4 (severe): Secondary | ICD-10-CM | POA: Diagnosis not present

## 2024-01-09 DIAGNOSIS — N183 Chronic kidney disease, stage 3 unspecified: Secondary | ICD-10-CM

## 2024-01-09 LAB — HEMOGLOBIN AND HEMATOCRIT (CANCER CENTER ONLY)
HCT: 31 % — ABNORMAL LOW (ref 39.0–52.0)
Hemoglobin: 9.9 g/dL — ABNORMAL LOW (ref 13.0–17.0)

## 2024-01-09 MED ORDER — EPOETIN ALFA-EPBX 20000 UNIT/ML IJ SOLN
20000.0000 [IU] | Freq: Once | INTRAMUSCULAR | Status: AC
  Administered 2024-01-09: 20000 [IU] via SUBCUTANEOUS
  Filled 2024-01-09: qty 1

## 2024-02-13 DIAGNOSIS — M25561 Pain in right knee: Secondary | ICD-10-CM | POA: Diagnosis not present

## 2024-02-14 DIAGNOSIS — E1169 Type 2 diabetes mellitus with other specified complication: Secondary | ICD-10-CM | POA: Diagnosis not present

## 2024-02-14 DIAGNOSIS — I152 Hypertension secondary to endocrine disorders: Secondary | ICD-10-CM | POA: Diagnosis not present

## 2024-02-14 DIAGNOSIS — E785 Hyperlipidemia, unspecified: Secondary | ICD-10-CM | POA: Diagnosis not present

## 2024-02-14 DIAGNOSIS — N183 Chronic kidney disease, stage 3 unspecified: Secondary | ICD-10-CM | POA: Diagnosis not present

## 2024-02-14 DIAGNOSIS — E1159 Type 2 diabetes mellitus with other circulatory complications: Secondary | ICD-10-CM | POA: Diagnosis not present

## 2024-02-14 DIAGNOSIS — E1122 Type 2 diabetes mellitus with diabetic chronic kidney disease: Secondary | ICD-10-CM | POA: Diagnosis not present

## 2024-02-20 ENCOUNTER — Ambulatory Visit

## 2024-02-20 ENCOUNTER — Other Ambulatory Visit

## 2024-02-20 ENCOUNTER — Inpatient Hospital Stay

## 2024-02-20 ENCOUNTER — Inpatient Hospital Stay: Attending: Oncology

## 2024-02-20 DIAGNOSIS — N184 Chronic kidney disease, stage 4 (severe): Secondary | ICD-10-CM | POA: Insufficient documentation

## 2024-02-20 DIAGNOSIS — D631 Anemia in chronic kidney disease: Secondary | ICD-10-CM | POA: Diagnosis not present

## 2024-02-20 LAB — HEMOGLOBIN AND HEMATOCRIT (CANCER CENTER ONLY)
HCT: 32 % — ABNORMAL LOW (ref 39.0–52.0)
Hemoglobin: 10.1 g/dL — ABNORMAL LOW (ref 13.0–17.0)

## 2024-02-20 NOTE — Progress Notes (Signed)
 Hgb 10.1 today, no inj given

## 2024-04-02 ENCOUNTER — Inpatient Hospital Stay: Attending: Oncology

## 2024-04-02 ENCOUNTER — Inpatient Hospital Stay

## 2024-04-02 DIAGNOSIS — D631 Anemia in chronic kidney disease: Secondary | ICD-10-CM | POA: Insufficient documentation

## 2024-04-02 DIAGNOSIS — N184 Chronic kidney disease, stage 4 (severe): Secondary | ICD-10-CM | POA: Diagnosis not present

## 2024-04-02 DIAGNOSIS — N183 Chronic kidney disease, stage 3 unspecified: Secondary | ICD-10-CM

## 2024-04-02 LAB — HEMOGLOBIN AND HEMATOCRIT (CANCER CENTER ONLY)
HCT: 32 % — ABNORMAL LOW (ref 39.0–52.0)
Hemoglobin: 10.3 g/dL — ABNORMAL LOW (ref 13.0–17.0)

## 2024-04-02 NOTE — Progress Notes (Signed)
 Hgb 10.3, no injection today.

## 2024-04-05 DIAGNOSIS — E538 Deficiency of other specified B group vitamins: Secondary | ICD-10-CM | POA: Diagnosis not present

## 2024-04-05 DIAGNOSIS — N401 Enlarged prostate with lower urinary tract symptoms: Secondary | ICD-10-CM | POA: Diagnosis not present

## 2024-04-05 DIAGNOSIS — D631 Anemia in chronic kidney disease: Secondary | ICD-10-CM | POA: Diagnosis not present

## 2024-04-05 DIAGNOSIS — E785 Hyperlipidemia, unspecified: Secondary | ICD-10-CM | POA: Diagnosis not present

## 2024-04-05 DIAGNOSIS — Z0000 Encounter for general adult medical examination without abnormal findings: Secondary | ICD-10-CM | POA: Diagnosis not present

## 2024-04-05 DIAGNOSIS — K219 Gastro-esophageal reflux disease without esophagitis: Secondary | ICD-10-CM | POA: Diagnosis not present

## 2024-04-05 DIAGNOSIS — I10 Essential (primary) hypertension: Secondary | ICD-10-CM | POA: Diagnosis not present

## 2024-04-05 DIAGNOSIS — E1121 Type 2 diabetes mellitus with diabetic nephropathy: Secondary | ICD-10-CM | POA: Diagnosis not present

## 2024-04-05 DIAGNOSIS — N1832 Chronic kidney disease, stage 3b: Secondary | ICD-10-CM | POA: Diagnosis not present

## 2024-05-14 ENCOUNTER — Other Ambulatory Visit: Payer: Self-pay

## 2024-05-14 ENCOUNTER — Inpatient Hospital Stay: Attending: Oncology

## 2024-05-14 DIAGNOSIS — M7989 Other specified soft tissue disorders: Secondary | ICD-10-CM | POA: Insufficient documentation

## 2024-05-14 DIAGNOSIS — N184 Chronic kidney disease, stage 4 (severe): Secondary | ICD-10-CM | POA: Diagnosis not present

## 2024-05-14 DIAGNOSIS — N183 Chronic kidney disease, stage 3 unspecified: Secondary | ICD-10-CM

## 2024-05-14 DIAGNOSIS — D631 Anemia in chronic kidney disease: Secondary | ICD-10-CM | POA: Insufficient documentation

## 2024-05-14 DIAGNOSIS — E538 Deficiency of other specified B group vitamins: Secondary | ICD-10-CM | POA: Insufficient documentation

## 2024-05-14 DIAGNOSIS — R972 Elevated prostate specific antigen [PSA]: Secondary | ICD-10-CM

## 2024-05-14 DIAGNOSIS — Z800 Family history of malignant neoplasm of digestive organs: Secondary | ICD-10-CM | POA: Diagnosis not present

## 2024-05-14 LAB — CBC WITH DIFFERENTIAL (CANCER CENTER ONLY)
Abs Immature Granulocytes: 0.05 K/uL (ref 0.00–0.07)
Basophils Absolute: 0 K/uL (ref 0.0–0.1)
Basophils Relative: 1 %
Eosinophils Absolute: 0.3 K/uL (ref 0.0–0.5)
Eosinophils Relative: 4 %
HCT: 32.4 % — ABNORMAL LOW (ref 39.0–52.0)
Hemoglobin: 10 g/dL — ABNORMAL LOW (ref 13.0–17.0)
Immature Granulocytes: 1 %
Lymphocytes Relative: 17 %
Lymphs Abs: 1 K/uL (ref 0.7–4.0)
MCH: 30.9 pg (ref 26.0–34.0)
MCHC: 30.9 g/dL (ref 30.0–36.0)
MCV: 100 fL (ref 80.0–100.0)
Monocytes Absolute: 0.9 K/uL (ref 0.1–1.0)
Monocytes Relative: 15 %
Neutro Abs: 3.7 K/uL (ref 1.7–7.7)
Neutrophils Relative %: 62 %
Platelet Count: 216 K/uL (ref 150–400)
RBC: 3.24 MIL/uL — ABNORMAL LOW (ref 4.22–5.81)
RDW: 13.8 % (ref 11.5–15.5)
WBC Count: 5.9 K/uL (ref 4.0–10.5)
nRBC: 0 % (ref 0.0–0.2)

## 2024-05-14 LAB — IRON AND TIBC
Iron: 67 ug/dL (ref 45–182)
Saturation Ratios: 20 % (ref 17.9–39.5)
TIBC: 335 ug/dL (ref 250–450)
UIBC: 268 ug/dL

## 2024-05-14 LAB — RETIC PANEL
Immature Retic Fract: 13.1 % (ref 2.3–15.9)
RBC.: 3.21 MIL/uL — ABNORMAL LOW (ref 4.22–5.81)
Retic Count, Absolute: 41.4 K/uL (ref 19.0–186.0)
Retic Ct Pct: 1.3 % (ref 0.4–3.1)
Reticulocyte Hemoglobin: 35.1 pg (ref >27.9)

## 2024-05-14 LAB — FERRITIN: Ferritin: 164 ng/mL (ref 24–336)

## 2024-05-21 ENCOUNTER — Encounter: Payer: Self-pay | Admitting: Oncology

## 2024-05-21 ENCOUNTER — Inpatient Hospital Stay

## 2024-05-21 ENCOUNTER — Ambulatory Visit: Payer: Self-pay | Admitting: Oncology

## 2024-05-21 ENCOUNTER — Inpatient Hospital Stay: Admitting: Oncology

## 2024-05-21 VITALS — BP 133/58 | HR 60 | Temp 96.3°F | Resp 18 | Wt 178.9 lb

## 2024-05-21 DIAGNOSIS — D631 Anemia in chronic kidney disease: Secondary | ICD-10-CM | POA: Diagnosis not present

## 2024-05-21 DIAGNOSIS — N183 Chronic kidney disease, stage 3 unspecified: Secondary | ICD-10-CM

## 2024-05-21 DIAGNOSIS — N184 Chronic kidney disease, stage 4 (severe): Secondary | ICD-10-CM | POA: Diagnosis not present

## 2024-05-21 NOTE — Progress Notes (Signed)
 Hematology/Oncology Progress note [phone] [phone]     ASSESSMENT & PLAN:   Anemia of chronic kidney failure, stage 3 (moderate) (HCC) # Anemia of chronic renal disease Labs reviewed and discussed with patient. Lab Results  Component Value Date   HGB 10.0 (L) 05/14/2024   TIBC 335 05/14/2024   IRONPCTSAT 20 05/14/2024   FERRITIN 164 05/14/2024   Hemoglobin is at 10.  I will hold off Retacrit . Ferritin level is less than 200.  Shared decision was made to hold off on Venofer  today Recommend patient to take ferrous sulfate  325 mg daily.  Orders Placed This Encounter  Procedures   CBC with Differential (Cancer Center Only)    Standing Status:   Future    Expected Date:   10/08/2024    Expiration Date:   01/06/2025   Iron  and TIBC    Standing Status:   Future    Expected Date:   10/08/2024    Expiration Date:   01/06/2025   Ferritin    Standing Status:   Future    Expected Date:   10/08/2024    Expiration Date:   01/06/2025   Retic Panel    Standing Status:   Future    Expected Date:   10/08/2024    Expiration Date:   01/06/2025   Hemoglobin and Hematocrit (Cancer Center Only)    Standing Status:   Future    Expected Date:   07/30/2024    Expiration Date:   10/28/2024   Follow up  10 week,   H&H +/- retacrit  20 weeks  labs prior to MD +/- Venofer  +/- retacrit  -   All questions were answered. The patient knows to call the clinic with any problems, questions or concerns.  Zelphia Cap, MD, PhD [facility] Hematology Oncology 05/21/2024   Chief Complaint: Joshua Cherry is a 73 y.o. male presents for anemia due to  stage III chronic kidney disease.  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    Work-up in 10/2014 revealed a hematocrit of 31.8, hemoglobin 10.3, MCV 95, with a normal WBC and platelet count.   B12 and folate were normal.  SPEP was normal on 07/09/2014.  Iron  studies were normal with a saturation of 18% and a TIBC of 297. Ferritin was 72.  Erythropoietin  was 10.6.  Antinuclear antibody direct was positive with an anti-DNA double-stranded 24 (0-9).  Sedimentation rate was 3.  Haptoglobin was 181. Coombs was negative.    Work-up on 08/10/2017 revealed a ferritin of 116.  Iron  saturation was 14% with a TIBC of 372.  BUN was 59 with a Cr 1.98 (CrCl 33-38 ml/min).  Epo level was 8.8.  SPEP revealed no monoclonal protein.  Retic was 1%.  Urinalysis revealed no hematuria.  Ferritin was 81 on 11/15/2019.  Labs on 11/19/2019 included an iron  saturation of 17% with a TIBC of 347. TSH and B12 were normal. Retic was 1.3%.  He receives Retacrit  every 2 weeks (12/13/2019 - 12/27/2019) then monthly   # EGD and colonoscopy noted only reflux in 2015.  Guaiac cards were negative.  Colonoscopy on 05/12/2020 revealed non-bleeding internal hemorrhoids.  He has never had a capsule study.     # 2015  B12 deficiency .  Patient has chronic bilateral lower extremity leg swelling.    INTERVAL HISTORY Joshua Cherry is a 73  y.o. male who has above history reviewed by me today presents for follow up visit for management of anemia of CKD Fatigue level is stable, no new complaints.  Denies any blood in his stool, stomach pain, lightheaded patient takes oral iron  supplementation ferrous sulfate  325 mg 2 times per week.   Review of Systems  Constitutional:  Negative for appetite change, chills, fatigue, fever and unexpected weight change.  HENT:   Negative for hearing loss and voice change.   Eyes:  Negative for eye problems and icterus.  Respiratory:  Negative for chest tightness, cough and shortness of breath.   Cardiovascular:  Negative for chest pain and leg swelling.  Gastrointestinal:  Negative for abdominal distention and abdominal pain.  Endocrine: Negative for hot flashes.  Genitourinary:  Negative for  difficulty urinating, dysuria and frequency.   Musculoskeletal:  Negative for arthralgias.  Skin:  Negative for itching and rash.  Neurological:  Negative for light-headedness and numbness.  Hematological:  Negative for adenopathy. Does not bruise/bleed easily.  Psychiatric/Behavioral:  Negative for confusion.        Past Medical History:  Diagnosis Date   Anemia    Chickenpox    Chronic kidney disease    Diabetes mellitus without complication (HCC)    GERD (gastroesophageal reflux disease)    Heart murmur    followed by PCP   Hypertension    Prostate enlargement    Torn meniscus    right    Past Surgical History:  Procedure Laterality Date   CATARACT EXTRACTION W/PHACO Left 09/08/2016   Procedure: CATARACT EXTRACTION PHACO AND INTRAOCULAR LENS PLACEMENT (IOC);  Surgeon: Dene Etienne, MD;  Location: [facility];  Service: Ophthalmology;  Laterality: Left;  DIABETIC left   CATARACT EXTRACTION W/PHACO Right 09/29/2016   Procedure: CATARACT EXTRACTION PHACO AND INTRAOCULAR LENS PLACEMENT (IOC)  right eye;  Surgeon: Dene Etienne, MD;  Location: [facility];  Service: Ophthalmology;  Laterality: Right;  Diabetic - oral meds Right Eye IVA Topical   COLONOSCOPY     COLONOSCOPY     COLONOSCOPY WITH PROPOFOL  N/A 05/12/2020   Procedure: COLONOSCOPY WITH PROPOFOL ;  Surgeon: Toledo, Ladell POUR, MD;  Location: [facility];  Service: Gastroenterology;  Laterality: N/A;   ESOPHAGOGASTRODUODENOSCOPY     ESOPHAGOGASTRODUODENOSCOPY      Family History  Problem Relation Age of Onset   Diabetes Mother    Hypertension Mother    Colon cancer Mother    Hyperlipidemia Mother    CAD Mother    Glaucoma Mother    Heart disease Mother    Heart attack Mother    Stroke Mother    Diabetes Father    Alzheimer's disease Father    Hypertension Father    Hyperlipidemia Father    Hip fracture Father    Diabetes Sister    Glaucoma Sister    Cancer Maternal  Grandmother     Social History:  reports that he has never smoked. He has never used smokeless tobacco. He reports that he does not drink alcohol and does not use drugs.  Allergies: No Known Allergies  Current Medications: Current Outpatient Medications  Medication Sig Dispense Refill   aspirin EC 81 MG tablet Take 81 mg by mouth daily.      B Complex-C (B COMPLEX-VITAMIN C) CAPS Take 1 tablet by mouth daily.      carvedilol (COREG) 3.125 MG tablet Take 3.125 mg by mouth 2 (two) times daily with a meal.      chlorthalidone (HYGROTON)  50 MG tablet Take by mouth.     Cholecalciferol (VITAMIN D3) 2000 UNITS capsule Take 2,000 Units by mouth daily.      empagliflozin (JARDIANCE) 25 MG TABS tablet 0.5 tablets daily.     ferrous sulfate  325 (65 FE) MG EC tablet Take by mouth.     finasteride  (PROSCAR ) 5 MG tablet Take 1 tablet (5 mg total) by mouth daily. 90 tablet 3   glipiZIDE (GLUCOTROL) 10 MG tablet Take by mouth. Take 1 tablet (10 mg total) by mouth 2 (two) times daily before meals     lisinopril (PRINIVIL,ZESTRIL) 40 MG tablet Take 40 mg by mouth daily.     lovastatin (MEVACOR) 40 MG tablet Take 40 mg by mouth at bedtime.     omeprazole (PRILOSEC) 20 MG capsule Take 40 mg by mouth in the morning and at bedtime.      ONETOUCH DELICA LANCETS 33G MISC      ONETOUCH ULTRA test strip USE ONCE DAILY AS DIRECTED   WHAT MACHINE  NOTHING SINCE 2018     pioglitazone (ACTOS) 45 MG tablet Take 45 mg by mouth daily.      sitaGLIPtin (JANUVIA) 50 MG tablet      spironolactone (ALDACTONE) 25 MG tablet Take 25 mg by mouth daily. Client states he is taking 1/2 tablet daily     tamsulosin  (FLOMAX ) 0.4 MG CAPS capsule Take 1 capsule (0.4 mg total) by mouth daily after breakfast. 90 capsule 3   vitamin B-12 (CYANOCOBALAMIN ) 1000 MCG tablet Take 1,000 mcg by mouth daily.     No current facility-administered medications for this visit.     Vitals:  Blood pressure (!) 133/58, pulse 60, temperature (!) 96.3  F (35.7 C), resp. rate 18, weight 178 lb 14.4 oz (81.1 kg), SpO2 97%.  Physical Exam Constitutional:      General: He is not in acute distress.    Appearance: He is not diaphoretic.     Comments: Ambulates independently  HENT:     Head: Normocephalic and atraumatic.     Nose: Nose normal.     Mouth/Throat:     Pharynx: No oropharyngeal exudate.  Eyes:     General: No scleral icterus.    Pupils: Pupils are equal, round, and reactive to light.  Cardiovascular:     Rate and Rhythm: Normal rate and regular rhythm.     Heart sounds: Murmur heard.  Pulmonary:     Effort: Pulmonary effort is normal. No respiratory distress.     Breath sounds: No rales.  Chest:     Chest wall: No tenderness.  Abdominal:     General: There is no distension.     Palpations: Abdomen is soft.     Tenderness: There is no abdominal tenderness.  Musculoskeletal:        General: Normal range of motion.     Cervical back: Normal range of motion and neck supple.     Comments:  trace edema lower extremity bilaterally  Skin:    General: Skin is warm and dry.     Findings: No erythema.  Neurological:     Mental Status: He is alert and oriented to person, place, and time.     Cranial Nerves: No cranial nerve deficit.     Motor: No abnormal muscle tone.     Coordination: Coordination normal.  Psychiatric:        Mood and Affect: Affect normal.

## 2024-05-21 NOTE — Assessment & Plan Note (Addendum)
#   Anemia of chronic renal disease Labs reviewed and discussed with patient. Lab Results  Component Value Date   HGB 10.0 (L) 05/14/2024   TIBC 335 05/14/2024   IRONPCTSAT 20 05/14/2024   FERRITIN 164 05/14/2024   Hemoglobin is at 10.  I will hold off Retacrit . Ferritin level is less than 200.  Shared decision was made to hold off on Venofer  today Recommend patient to take ferrous sulfate  325 mg daily.

## 2024-05-21 NOTE — Progress Notes (Signed)
 No Retacrit  today per MD LOS

## 2024-05-22 ENCOUNTER — Encounter: Payer: Self-pay | Admitting: Urgent Care

## 2024-05-22 NOTE — Telephone Encounter (Signed)
 Called patient and informed him that Dr. Babara recommends that he take ferrous sulfate  325mg  daily instead of twice a week. Patient gave verbal understanding to this.

## 2024-05-22 NOTE — Telephone Encounter (Signed)
-----   Message from Zelphia Cap sent at 05/21/2024  8:34 PM EDT ----- Please let patient know that I recommend patient to take oral iron  supplementation ferrous sulfate  325 mg daily. (He is currently taking 2 times per week) thank you ----- Message ----- From: Interface, Lab In [place] Sent: 05/14/2024   8:57 AM EDT To: Zelphia Cap, MD

## 2024-06-06 DIAGNOSIS — D631 Anemia in chronic kidney disease: Secondary | ICD-10-CM | POA: Diagnosis not present

## 2024-06-06 DIAGNOSIS — N1832 Chronic kidney disease, stage 3b: Secondary | ICD-10-CM | POA: Diagnosis not present

## 2024-06-06 DIAGNOSIS — I10 Essential (primary) hypertension: Secondary | ICD-10-CM | POA: Diagnosis not present

## 2024-06-13 DIAGNOSIS — E875 Hyperkalemia: Secondary | ICD-10-CM | POA: Diagnosis not present

## 2024-06-13 DIAGNOSIS — N1832 Chronic kidney disease, stage 3b: Secondary | ICD-10-CM | POA: Diagnosis not present

## 2024-06-13 DIAGNOSIS — D631 Anemia in chronic kidney disease: Secondary | ICD-10-CM | POA: Diagnosis not present

## 2024-06-13 DIAGNOSIS — I10 Essential (primary) hypertension: Secondary | ICD-10-CM | POA: Diagnosis not present

## 2024-06-16 ENCOUNTER — Other Ambulatory Visit: Payer: Self-pay | Admitting: Urology

## 2024-06-16 DIAGNOSIS — N400 Enlarged prostate without lower urinary tract symptoms: Secondary | ICD-10-CM

## 2024-06-20 ENCOUNTER — Other Ambulatory Visit: Payer: Self-pay

## 2024-06-20 DIAGNOSIS — R972 Elevated prostate specific antigen [PSA]: Secondary | ICD-10-CM | POA: Diagnosis not present

## 2024-06-21 LAB — PSA: Prostate Specific Ag, Serum: 2.7 ng/mL (ref 0.0–4.0)

## 2024-06-25 NOTE — Progress Notes (Unsigned)
 06/27/2024 8:42 AM   Joshua Cherry Feb 23, 1951 [id number]  Referring provider: Jeffie Cheryl BRAVO, MD [address] [place],  [place] [address]  Urological history: 1. BPH with LU TS -PSA (05/2024) 2.7 ~ 5.4  -Tamsulosin  0.4 mg daily and finasteride  5 mg daily  Chief Complaint  Patient presents with   Benign Prostatic Hypertrophy   HPI: Joshua Cherry is a 73 y.o. male who presents today for yearly visit.   Previous records reviewed.   I PSS 3/2  He has mild feelings of incomplete bladder emptying, frequency and nocturia x 1.  Patient denies any modifying or aggravating factors.  Patient denies any recent UTI's, gross hematuria, dysuria or suprapubic/flank pain.  Patient denies any fevers, chills, nausea or vomiting.    PSA 2.7 (5.4)   Serum creatinine 2.18, eGFR 31  Hemoglobin A1c (01/2024) 7.7  BPH meds: Finasteride  5 mg daily and tamsulosin  0.4 mg daily  Diuretics: Spironolactone  He has been having diarrhea for the last few weeks.      PMH: Past Medical History:  Diagnosis Date   Anemia    Chickenpox    Chronic kidney disease    Diabetes mellitus without complication (HCC)    GERD (gastroesophageal reflux disease)    Heart murmur    followed by PCP   Hypertension    Prostate enlargement    Torn meniscus    right    Surgical History: Past Surgical History:  Procedure Laterality Date   CATARACT EXTRACTION W/PHACO Left 09/08/2016   Procedure: CATARACT EXTRACTION PHACO AND INTRAOCULAR LENS PLACEMENT (IOC);  Surgeon: Dene Etienne, MD;  Location: [facility];  Service: Ophthalmology;  Laterality: Left;  DIABETIC left   CATARACT EXTRACTION W/PHACO Right 09/29/2016   Procedure: CATARACT EXTRACTION PHACO AND INTRAOCULAR LENS PLACEMENT (IOC)  right eye;  Surgeon: Dene Etienne, MD;  Location: [facility];  Service: Ophthalmology;  Laterality: Right;  Diabetic - oral meds Right Eye IVA Topical   COLONOSCOPY     COLONOSCOPY      COLONOSCOPY WITH PROPOFOL  N/A 05/12/2020   Procedure: COLONOSCOPY WITH PROPOFOL ;  Surgeon: Toledo, Ladell POUR, MD;  Location: [facility];  Service: Gastroenterology;  Laterality: N/A;   ESOPHAGOGASTRODUODENOSCOPY     ESOPHAGOGASTRODUODENOSCOPY      Home Medications:  Allergies as of 06/27/2024   No Known Allergies      Medication List        Accurate as of June 27, 2024  8:42 AM. If you have any questions, ask your nurse or doctor.          aspirin EC 81 MG tablet Take 81 mg by mouth daily.   B Complex-Vitamin C Caps Take 1 tablet by mouth daily.   carvedilol 3.125 MG tablet Commonly known as: COREG Take 3.125 mg by mouth 2 (two) times daily with a meal.   chlorthalidone 50 MG tablet Commonly known as: HYGROTON Take by mouth.   cyanocobalamin  1000 MCG tablet Commonly known as: VITAMIN B12 Take 1,000 mcg by mouth daily.   empagliflozin 25 MG Tabs tablet Commonly known as: JARDIANCE 0.5 tablets daily.   ferrous sulfate  325 (65 FE) MG EC tablet Take by mouth.   finasteride  5 MG tablet Commonly known as: PROSCAR  Take 1 tablet (5 mg total) by mouth daily.   glipiZIDE 10 MG tablet Commonly known as: GLUCOTROL Take by mouth. Take 1 tablet (10 mg total) by mouth 2 (two) times daily before meals   lisinopril 40 MG tablet  Commonly known as: ZESTRIL Take 40 mg by mouth daily.   lovastatin 40 MG tablet Commonly known as: MEVACOR Take 40 mg by mouth at bedtime.   omeprazole 20 MG capsule Commonly known as: PRILOSEC Take 40 mg by mouth in the morning and at bedtime.   OneTouch Delica Lancets 33G Misc   OneTouch Ultra test strip Generic drug: glucose blood USE ONCE DAILY AS DIRECTED   WHAT MACHINE  NOTHING SINCE 2018   pioglitazone 45 MG tablet Commonly known as: ACTOS Take 45 mg by mouth daily.   sitaGLIPtin 50 MG tablet Commonly known as: JANUVIA What changed: See the new instructions.   spironolactone 25 MG tablet Commonly known as:  ALDACTONE Take 25 mg by mouth daily. Client states he is taking 1/2 tablet daily   tamsulosin  0.4 MG Caps capsule Commonly known as: FLOMAX  Take 1 capsule (0.4 mg total) by mouth daily after breakfast.   Vitamin D3 50 MCG (2000 UT) capsule Take 2,000 Units by mouth daily.        Allergies: No Known Allergies  Family History: Family History  Problem Relation Age of Onset   Diabetes Mother    Hypertension Mother    Colon cancer Mother    Hyperlipidemia Mother    CAD Mother    Glaucoma Mother    Heart disease Mother    Heart attack Mother    Stroke Mother    Diabetes Father    Alzheimer's disease Father    Hypertension Father    Hyperlipidemia Father    Hip fracture Father    Diabetes Sister    Glaucoma Sister    Cancer Maternal Grandmother     Social History:  reports that he has never smoked. He has never used smokeless tobacco. He reports that he does not drink alcohol and does not use drugs.  ROS: Pertinent ROS in HPI  Physical Exam: BP (!) 157/65   Pulse 74   Ht 6' (1.829 m)   Wt 180 lb (81.6 kg)   BMI 24.41 kg/m   Constitutional:  Well nourished. Alert and oriented, No acute distress. HEENT: [place] AT, moist mucus membranes.  Trachea midline Cardiovascular: No clubbing, cyanosis, or edema. Respiratory: Normal respiratory effort, no increased work of breathing. Neurologic: Grossly intact, no focal deficits, moving all 4 extremities. Psychiatric: Normal mood and affect.   Laboratory Data: See Epic and HPI   I have reviewed the labs.   Pertinent Imaging: N/A  Assessment & Plan:    1. BPH with LU TS - stable, mild symptoms  - PSA up to date  - patient declined DRE today due to his diarrhea - encouraged avoiding bladder irritants, fluid restriction before bedtime and timed voiding's - Continue tamsulosin  0.4 mg daily and finasteride  5 mg daily;refills given  2. Diarrhea - encouraged him to reach out to his PCP for further evaluation of his diarrhea  and he stated he would  Return in about 1 year (around 06/27/2025) for PSA, I PSS, DRE .  These notes generated with voice recognition software. I apologize for typographical errors.  Joshua Cherry  [facility] [address]  [address] [place], [place] [address] [phone]

## 2024-06-27 ENCOUNTER — Ambulatory Visit: Payer: Self-pay | Admitting: Urology

## 2024-06-27 ENCOUNTER — Encounter: Payer: Self-pay | Admitting: Urology

## 2024-06-27 VITALS — BP 157/65 | HR 74 | Ht 72.0 in | Wt 180.0 lb

## 2024-06-27 DIAGNOSIS — N400 Enlarged prostate without lower urinary tract symptoms: Secondary | ICD-10-CM

## 2024-06-27 DIAGNOSIS — K591 Functional diarrhea: Secondary | ICD-10-CM | POA: Diagnosis not present

## 2024-06-27 MED ORDER — FINASTERIDE 5 MG PO TABS: 5.0000 mg | ORAL_TABLET | Freq: Every day | ORAL | 3 refills | Status: AC

## 2024-06-27 MED ORDER — TAMSULOSIN HCL 0.4 MG PO CAPS: 0.4000 mg | ORAL_CAPSULE | Freq: Every day | ORAL | 3 refills | Status: AC

## 2024-06-28 DIAGNOSIS — E785 Hyperlipidemia, unspecified: Secondary | ICD-10-CM | POA: Diagnosis not present

## 2024-06-28 DIAGNOSIS — I152 Hypertension secondary to endocrine disorders: Secondary | ICD-10-CM | POA: Diagnosis not present

## 2024-06-28 DIAGNOSIS — E1169 Type 2 diabetes mellitus with other specified complication: Secondary | ICD-10-CM | POA: Diagnosis not present

## 2024-06-28 DIAGNOSIS — N183 Chronic kidney disease, stage 3 unspecified: Secondary | ICD-10-CM | POA: Diagnosis not present

## 2024-06-28 DIAGNOSIS — E1122 Type 2 diabetes mellitus with diabetic chronic kidney disease: Secondary | ICD-10-CM | POA: Diagnosis not present

## 2024-06-28 DIAGNOSIS — E1159 Type 2 diabetes mellitus with other circulatory complications: Secondary | ICD-10-CM | POA: Diagnosis not present

## 2024-07-02 DIAGNOSIS — R197 Diarrhea, unspecified: Secondary | ICD-10-CM | POA: Diagnosis not present

## 2024-07-30 ENCOUNTER — Inpatient Hospital Stay

## 2024-07-30 ENCOUNTER — Inpatient Hospital Stay: Attending: Oncology

## 2024-07-30 DIAGNOSIS — N184 Chronic kidney disease, stage 4 (severe): Secondary | ICD-10-CM | POA: Diagnosis present

## 2024-07-30 DIAGNOSIS — D631 Anemia in chronic kidney disease: Secondary | ICD-10-CM | POA: Insufficient documentation

## 2024-07-30 DIAGNOSIS — N183 Chronic kidney disease, stage 3 unspecified: Secondary | ICD-10-CM

## 2024-07-30 LAB — HEMOGLOBIN AND HEMATOCRIT (CANCER CENTER ONLY)
HCT: 34.4 % — ABNORMAL LOW (ref 39.0–52.0)
Hemoglobin: 11 g/dL — ABNORMAL LOW (ref 13.0–17.0)

## 2024-07-30 NOTE — Progress Notes (Signed)
 Hgb at 11 today, hold retacrit ; per parameters hold it at 10.

## 2024-08-03 DIAGNOSIS — Z1331 Encounter for screening for depression: Secondary | ICD-10-CM | POA: Diagnosis not present

## 2024-08-03 DIAGNOSIS — A0472 Enterocolitis due to Clostridium difficile, not specified as recurrent: Secondary | ICD-10-CM | POA: Diagnosis not present

## 2024-09-04 NOTE — Progress Notes (Signed)
 Joshua Cherry                                          MRN: [id number]   09/04/2024   The [facility] Quality Team Specialist reviewed this patient medical record for the purposes of chart review for care gap closure. The following were reviewed: abstraction for care gap closure-controlling blood pressure.    [facility] Quality Team

## 2024-09-28 ENCOUNTER — Telehealth: Payer: Self-pay | Admitting: Oncology

## 2024-09-28 NOTE — Telephone Encounter (Signed)
 Called pt to r/s lab on 2/2 due to weather - [facility]

## 2024-10-01 ENCOUNTER — Inpatient Hospital Stay

## 2024-10-03 ENCOUNTER — Inpatient Hospital Stay: Attending: Oncology

## 2024-10-03 DIAGNOSIS — N183 Chronic kidney disease, stage 3 unspecified: Secondary | ICD-10-CM

## 2024-10-03 LAB — IRON AND TIBC
Iron: 71 ug/dL (ref 45–182)
Saturation Ratios: 21 % (ref 17.9–39.5)
TIBC: 343 ug/dL (ref 250–450)
UIBC: 272 ug/dL

## 2024-10-03 LAB — CBC WITH DIFFERENTIAL (CANCER CENTER ONLY)
Abs Immature Granulocytes: 0.03 10*3/uL (ref 0.00–0.07)
Basophils Absolute: 0 10*3/uL (ref 0.0–0.1)
Basophils Relative: 1 %
Eosinophils Absolute: 0.3 10*3/uL (ref 0.0–0.5)
Eosinophils Relative: 5 %
HCT: 33.9 % — ABNORMAL LOW (ref 39.0–52.0)
Hemoglobin: 10.8 g/dL — ABNORMAL LOW (ref 13.0–17.0)
Immature Granulocytes: 1 %
Lymphocytes Relative: 22 %
Lymphs Abs: 1 10*3/uL (ref 0.7–4.0)
MCH: 32.1 pg (ref 26.0–34.0)
MCHC: 31.9 g/dL (ref 30.0–36.0)
MCV: 100.9 fL — ABNORMAL HIGH (ref 80.0–100.0)
Monocytes Absolute: 0.6 10*3/uL (ref 0.1–1.0)
Monocytes Relative: 12 %
Neutro Abs: 2.9 10*3/uL (ref 1.7–7.7)
Neutrophils Relative %: 59 %
Platelet Count: 190 10*3/uL (ref 150–400)
RBC: 3.36 MIL/uL — ABNORMAL LOW (ref 4.22–5.81)
RDW: 13.3 % (ref 11.5–15.5)
WBC Count: 4.8 10*3/uL (ref 4.0–10.5)
nRBC: 0 % (ref 0.0–0.2)

## 2024-10-03 LAB — RETIC PANEL
Immature Retic Fract: 15.2 % (ref 2.3–15.9)
RBC.: 3.33 MIL/uL — ABNORMAL LOW (ref 4.22–5.81)
Retic Count, Absolute: 42.6 10*3/uL (ref 19.0–186.0)
Retic Ct Pct: 1.3 % (ref 0.4–3.1)
Reticulocyte Hemoglobin: 34.9 pg

## 2024-10-03 LAB — FERRITIN: Ferritin: 328 ng/mL (ref 24–336)

## 2024-10-08 ENCOUNTER — Inpatient Hospital Stay

## 2024-10-08 ENCOUNTER — Inpatient Hospital Stay: Admitting: Oncology

## 2025-06-20 ENCOUNTER — Other Ambulatory Visit

## 2025-06-27 ENCOUNTER — Ambulatory Visit: Admitting: Urology
